# Patient Record
Sex: Female | Born: 1950 | Race: White | Hispanic: No
Health system: Midwestern US, Community
[De-identification: ages and names within clinical notes are randomized; demographics above are authoritative.]

## PROBLEM LIST (undated history)

## (undated) DIAGNOSIS — I10 Essential (primary) hypertension: Secondary | ICD-10-CM

## (undated) DIAGNOSIS — K219 Gastro-esophageal reflux disease without esophagitis: Secondary | ICD-10-CM

## (undated) DIAGNOSIS — T7840XA Allergy, unspecified, initial encounter: Secondary | ICD-10-CM

## (undated) DIAGNOSIS — H269 Unspecified cataract: Secondary | ICD-10-CM

## (undated) DIAGNOSIS — M47816 Spondylosis without myelopathy or radiculopathy, lumbar region: Secondary | ICD-10-CM

## (undated) DIAGNOSIS — M3500 Sicca syndrome, unspecified: Secondary | ICD-10-CM

## (undated) DIAGNOSIS — M1990 Unspecified osteoarthritis, unspecified site: Secondary | ICD-10-CM

## (undated) DIAGNOSIS — E039 Hypothyroidism, unspecified: Secondary | ICD-10-CM

## (undated) HISTORY — DX: Hypothyroidism, unspecified: E03.9

## (undated) HISTORY — DX: Unspecified osteoarthritis, unspecified site: M19.90

## (undated) HISTORY — DX: Sjogren syndrome, unspecified: M35.00

## (undated) HISTORY — PX: CATARACT EXTRACTION, BILATERAL: SHX1313

## (undated) HISTORY — PX: KNEE ARTHROSCOPY W/ MENISCECTOMY: SHX1879

## (undated) HISTORY — DX: Gastro-esophageal reflux disease without esophagitis: K21.9

## (undated) HISTORY — PX: KNEE SURGERY: SHX244

## (undated) HISTORY — DX: Unspecified cataract: H26.9

## (undated) HISTORY — DX: Essential (primary) hypertension: I10

## (undated) HISTORY — PX: EYE SURGERY: SHX253

## (undated) HISTORY — DX: Allergy, unspecified, initial encounter: T78.40XA

## (undated) HISTORY — DX: Spondylosis without myelopathy or radiculopathy, lumbar region: M47.816

---

## 1995-02-28 DIAGNOSIS — E039 Hypothyroidism, unspecified: Secondary | ICD-10-CM | POA: Insufficient documentation

## 2011-08-24 DIAGNOSIS — M25569 Pain in unspecified knee: Secondary | ICD-10-CM | POA: Insufficient documentation

## 2011-08-24 DIAGNOSIS — G8929 Other chronic pain: Secondary | ICD-10-CM | POA: Insufficient documentation

## 2011-12-08 DIAGNOSIS — Z85828 Personal history of other malignant neoplasm of skin: Secondary | ICD-10-CM | POA: Insufficient documentation

## 2013-05-22 ENCOUNTER — Other Ambulatory Visit: Payer: Self-pay | Admitting: Obstetrics and Gynecology

## 2013-05-22 DIAGNOSIS — R928 Other abnormal and inconclusive findings on diagnostic imaging of breast: Secondary | ICD-10-CM

## 2013-06-02 ENCOUNTER — Ambulatory Visit
Admission: RE | Admit: 2013-06-02 | Discharge: 2013-06-02 | Disposition: A | Discharge status: Home / Self Care | Payer: Self-pay | Source: Ambulatory Visit | Attending: Obstetrics and Gynecology | Admitting: Obstetrics and Gynecology

## 2013-06-02 DIAGNOSIS — R928 Other abnormal and inconclusive findings on diagnostic imaging of breast: Secondary | ICD-10-CM

## 2014-06-15 ENCOUNTER — Other Ambulatory Visit: Payer: Self-pay | Admitting: Obstetrics and Gynecology

## 2014-06-15 DIAGNOSIS — R928 Other abnormal and inconclusive findings on diagnostic imaging of breast: Secondary | ICD-10-CM

## 2014-06-17 ENCOUNTER — Ambulatory Visit
Admission: RE | Admit: 2014-06-17 | Discharge: 2014-06-17 | Disposition: A | Discharge status: Home / Self Care | Payer: Federal, State, Local not specified - PPO | Source: Ambulatory Visit | Attending: Obstetrics and Gynecology | Admitting: Obstetrics and Gynecology

## 2014-06-17 DIAGNOSIS — R928 Other abnormal and inconclusive findings on diagnostic imaging of breast: Secondary | ICD-10-CM

## 2014-12-15 ENCOUNTER — Ambulatory Visit (INDEPENDENT_AMBULATORY_CARE_PROVIDER_SITE_OTHER): Payer: Federal, State, Local not specified - PPO | Admitting: Sports Medicine

## 2014-12-15 ENCOUNTER — Encounter: Payer: Self-pay | Admitting: Sports Medicine

## 2014-12-15 VITALS — BP 121/73 | HR 56 | Ht 63.5 in | Wt 126.0 lb

## 2014-12-15 DIAGNOSIS — M7652 Patellar tendinitis, left knee: Secondary | ICD-10-CM | POA: Diagnosis not present

## 2014-12-15 DIAGNOSIS — M47816 Spondylosis without myelopathy or radiculopathy, lumbar region: Secondary | ICD-10-CM

## 2014-12-15 DIAGNOSIS — M722 Plantar fascial fibromatosis: Secondary | ICD-10-CM | POA: Insufficient documentation

## 2014-12-15 MED ORDER — MELOXICAM 15 MG PO TABS: ORAL_TABLET | ORAL | Status: DC

## 2014-12-15 NOTE — Assessment & Plan Note (Signed)
Has failed months of physical therapy, a couple of unguided steroid injections, and custom orthotics created by podiatry. She will return for a new set of custom orthotics and I am going to switch her to meloxicam. Certainly we can try the air heel, as well as a nighttime splint in the future.

## 2014-12-15 NOTE — Assessment & Plan Note (Signed)
Sounds to be facet arthritis, mild discogenic-type symptoms as well. Has only had x-rays, and months of physical therapy. Meloxicam,I would like to get an MRI at this point for interventional planning, she will call back and let me know when she is ready.

## 2014-12-15 NOTE — Assessment & Plan Note (Signed)
Confirmed on MRI 6 years ago, has failed multiple PRP injections, physical therapy, bracing and strapping. We probably we'll need a new MRI, and she is agreeable to try another PRP injection possibly in the future. She will think about it and let me know if she would like a new MRI.

## 2014-12-15 NOTE — Progress Notes (Signed)
   Subjective:    I'm seeing this patient as a consultation for:  Dr. Loyal JacobsonMichael Kalish  CC: multiple issues  HPI: Left foot pain: Clinical diagnosis of plantar fasciitis post months of physical therapy, has had a couple of injections non-with ultrasound guidance, had some orthotics made by podiatrist, rigid plastic, uncomfortable. Unfortunately with persistent pain in the mornings, moderate, persistent, no radiation.  Low back pain: Sharp, axial without radiculopathy, worse with sitting, no bowel or bladder dysfunction or saddle numbness, has only had x-rays and months of physical therapy, Celebrex is ineffective. She does get significant morning stiffness and has been referred to rheumatology.  Left knee pain: Post partial meniscectomy, now with pain inferior to the patella, MRI 6 years ago showed patellar tendinosis, she has been through 2 or 3 sessions of PRP without any improvement.  Past medical history, Surgical history, Family history not pertinant except as noted below, Social history, Allergies, and medications have been entered into the medical record, reviewed, and no changes needed.   Review of Systems: No headache, visual changes, nausea, vomiting, diarrhea, constipation, dizziness, abdominal pain, skin rash, fevers, chills, night sweats, weight loss, swollen lymph nodes, body aches, joint swelling, muscle aches, chest pain, shortness of breath, mood changes, visual or auditory hallucinations.   Objective:   General: Well Developed, well nourished, and in no acute distress.  Neuro/Psych: Alert and oriented x3, extra-ocular muscles intact, able to move all 4 extremities, sensation grossly intact. Skin: Warm and dry, no rashes noted.  Respiratory: Not using accessory muscles, speaking in full sentences, trachea midline.  Cardiovascular: Pulses palpable, no extremity edema. Abdomen: Does not appear distended. Left Foot: No visible erythema or swelling. Range of motion is full in  all directions. Strength is 5/5 in all directions. No hallux valgus. No pes cavus or pes planus. No abnormal callus noted. No pain over the navicular prominence, or base of fifth metatarsal. mild tenderness to palpation of the calcaneal insertion of plantar fascia. No pain at the Achilles insertion. No pain over the calcaneal bursa. No pain of the retrocalcaneal bursa. No tenderness to palpation over the tarsals, metatarsals, or phalanges. No hallux rigidus or limitus. No tenderness palpation over interphalangeal joints. No pain with compression of the metatarsal heads. Neurovascularly intact distally. Left Knee: Normal to inspection with no erythema or effusion or obvious bony abnormalities. Palpation normal with no warmth or joint line tenderness or patellar tenderness or condyle tenderness. ROM normal in flexion and extension and lower leg rotation. Ligaments with solid consistent endpoints including ACL, PCL, LCL, MCL. Negative Mcmurray's and provocative meniscal tests. Non painful patellar compression. Tender palpation at the inferior patellar pole. Hamstring and quadriceps strength is normal.  Impression and Recommendations:   This case required medical decision making of moderate complexity.

## 2014-12-24 ENCOUNTER — Ambulatory Visit (INDEPENDENT_AMBULATORY_CARE_PROVIDER_SITE_OTHER): Payer: Federal, State, Local not specified - PPO | Admitting: Sports Medicine

## 2014-12-24 ENCOUNTER — Encounter: Payer: Self-pay | Admitting: Sports Medicine

## 2014-12-24 VITALS — BP 134/77 | HR 57 | Ht 63.5 in | Wt 127.0 lb

## 2014-12-24 DIAGNOSIS — M722 Plantar fascial fibromatosis: Secondary | ICD-10-CM

## 2014-12-24 NOTE — Progress Notes (Signed)
    Patient was fitted for a : standard, cushioned, semi-rigid orthotic. The orthotic was heated and afterward the patient stood on the orthotic blank positioned on the orthotic stand. The patient was positioned in subtalar neutral position and 10 degrees of ankle dorsiflexion in a weight bearing stance. After completion of molding, a stable base was applied to the orthotic blank. The blank was ground to a stable position for weight bearing. Size: 8 Base: [facility] Additional Posting and Padding:  I did plane down the toes The patient ambulated these, and they were very comfortable.  I spent 40 minutes with this patient, greater than 50% was face-to-face time counseling regarding the below diagnosis.

## 2014-12-24 NOTE — Assessment & Plan Note (Signed)
Custom orthotics as above, return as needed. 

## 2015-01-25 ENCOUNTER — Ambulatory Visit (INDEPENDENT_AMBULATORY_CARE_PROVIDER_SITE_OTHER): Payer: Federal, State, Local not specified - PPO | Admitting: Sports Medicine

## 2015-01-25 ENCOUNTER — Encounter: Payer: Self-pay | Admitting: Sports Medicine

## 2015-01-25 DIAGNOSIS — M47816 Spondylosis without myelopathy or radiculopathy, lumbar region: Secondary | ICD-10-CM | POA: Diagnosis not present

## 2015-01-25 DIAGNOSIS — M7652 Patellar tendinitis, left knee: Secondary | ICD-10-CM | POA: Diagnosis not present

## 2015-01-25 DIAGNOSIS — M722 Plantar fascial fibromatosis: Secondary | ICD-10-CM | POA: Diagnosis not present

## 2015-01-25 MED ORDER — DIAZEPAM 5 MG PO TABS: ORAL_TABLET | ORAL | Status: DC

## 2015-01-25 NOTE — Assessment & Plan Note (Signed)
Has failed multiple PRP injections, physical therapy, bracing, strapping. We are going to obtain a new MRI, she will probably need to proceed to surgical intervention.

## 2015-01-25 NOTE — Progress Notes (Signed)
  Subjective:    CC: follow-up  HPI: Plantar fasciitis: Persistent pain despite custom orthotics, she has had several unguided injections by other providers. Pain is moderate, persistent and localized at the plantar fascial origin.  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 Agreeable to proceed with MR.  Lumbar spondylosis: Mild discogenic symptoms but predominantly facet-type pain, she has already had greater than 6 weeks of physical therapy, oral medications, x-rays. At this point she is agreeable to proceed with MRI for interventional injection planning.  Past medical history, Surgical history, Family history not pertinant except as noted below, Social history, Allergies, and medications have been entered into the medical record, reviewed, and no changes needed.   Review of Systems: No fevers, chills, night sweats, weight loss, chest pain, or shortness of breath.   Objective:    General: Well Developed, well nourished, and in no acute distress.  Neuro: Alert and oriented x3, extra-ocular muscles intact, sensation grossly intact.  HEENT: Normocephalic, atraumatic, pupils equal round reactive to light, neck supple, no masses, no lymphadenopathy, thyroid nonpalpable.  Skin: Warm and dry, no rashes. Cardiac: Regular rate and rhythm, no murmurs rubs or gallops, no lower extremity edema.  Respiratory: Clear to auscultation bilaterally. Not using accessory muscles, speaking in full sentences.  Procedure: Real-time Ultrasound Guided Injection of left plantar fascia origin Device: GE Logiq E  Verbal informed consent obtained.  Time-out conducted.  Noted no overlying erythema, induration, or other signs of local infection.  Skin prepped in a sterile fashion.  Local anesthesia: Topical Ethyl chloride.  With sterile technique and under real time ultrasound guidance:  1 mL kenalog 40, 2 mL lidocaine injected easily. Completed without  difficulty  Pain immediately resolved suggesting accurate placement of the medication.  Advised to call if fevers/chills, erythema, induration, drainage, or persistent bleeding.  Images permanently stored and available for review in the ultrasound unit.  Impression: Technically successful ultrasound guided injection.  Impression and Recommendations:

## 2015-01-25 NOTE — Assessment & Plan Note (Signed)
Ultrasound guided injection as above, we are going to obtain an MRI, she hasn't had much improvement with the custom orthotics.

## 2015-01-25 NOTE — Assessment & Plan Note (Signed)
Mild discogenic symptoms with predominantly facet mediated pain.  has already had months of physical therapy, oral medications, x-rays. Persistent pain, we are going to obtain an MRI for interventional planning.

## 2015-01-30 ENCOUNTER — Ambulatory Visit (HOSPITAL_BASED_OUTPATIENT_CLINIC_OR_DEPARTMENT_OTHER)
Admission: RE | Admit: 2015-01-30 | Discharge: 2015-01-30 | Disposition: A | Discharge status: Home / Self Care | Payer: Federal, State, Local not specified - PPO | Source: Ambulatory Visit | Attending: Sports Medicine | Admitting: Sports Medicine

## 2015-01-30 DIAGNOSIS — M47816 Spondylosis without myelopathy or radiculopathy, lumbar region: Secondary | ICD-10-CM | POA: Diagnosis not present

## 2015-01-30 DIAGNOSIS — M7122 Synovial cyst of popliteal space [Baker], left knee: Secondary | ICD-10-CM | POA: Insufficient documentation

## 2015-01-30 DIAGNOSIS — M7652 Patellar tendinitis, left knee: Secondary | ICD-10-CM | POA: Insufficient documentation

## 2015-01-30 DIAGNOSIS — M1288 Other specific arthropathies, not elsewhere classified, other specified site: Secondary | ICD-10-CM | POA: Diagnosis not present

## 2015-01-30 DIAGNOSIS — M25562 Pain in left knee: Secondary | ICD-10-CM | POA: Insufficient documentation

## 2015-01-30 DIAGNOSIS — M5126 Other intervertebral disc displacement, lumbar region: Secondary | ICD-10-CM | POA: Insufficient documentation

## 2015-01-30 DIAGNOSIS — M722 Plantar fascial fibromatosis: Secondary | ICD-10-CM | POA: Insufficient documentation

## 2015-01-30 DIAGNOSIS — M659 Synovitis and tenosynovitis, unspecified: Secondary | ICD-10-CM | POA: Insufficient documentation

## 2015-01-30 DIAGNOSIS — M7050 Other bursitis of knee, unspecified knee: Secondary | ICD-10-CM | POA: Diagnosis not present

## 2015-02-04 DIAGNOSIS — J301 Allergic rhinitis due to pollen: Secondary | ICD-10-CM | POA: Insufficient documentation

## 2015-02-04 DIAGNOSIS — H903 Sensorineural hearing loss, bilateral: Secondary | ICD-10-CM | POA: Insufficient documentation

## 2015-02-04 DIAGNOSIS — R040 Epistaxis: Secondary | ICD-10-CM | POA: Insufficient documentation

## 2015-02-04 DIAGNOSIS — H9319 Tinnitus, unspecified ear: Secondary | ICD-10-CM | POA: Insufficient documentation

## 2015-02-04 DIAGNOSIS — Z9109 Other allergy status, other than to drugs and biological substances: Secondary | ICD-10-CM | POA: Insufficient documentation

## 2015-02-09 ENCOUNTER — Other Ambulatory Visit: Payer: Self-pay | Admitting: Sports Medicine

## 2015-02-09 ENCOUNTER — Ambulatory Visit (INDEPENDENT_AMBULATORY_CARE_PROVIDER_SITE_OTHER): Payer: Federal, State, Local not specified - PPO | Admitting: Sports Medicine

## 2015-02-09 VITALS — BP 142/75 | HR 59 | Temp 98.3°F | Resp 16 | Wt 126.0 lb

## 2015-02-09 DIAGNOSIS — M722 Plantar fascial fibromatosis: Secondary | ICD-10-CM

## 2015-02-09 DIAGNOSIS — M47816 Spondylosis without myelopathy or radiculopathy, lumbar region: Secondary | ICD-10-CM

## 2015-02-09 NOTE — Progress Notes (Signed)

## 2015-02-09 NOTE — Assessment & Plan Note (Addendum)
MRI of the lumbar spine did show an L4-L5 protruding disc, symptoms were mildly discogenic but predominantly facetogenic, she doesn't fact have bilateral L4-L5 facet arthritis as well as right L5-S1 facet arthritis, the right L4-L5 and right L5-S1 facets will serve as future interventional targets if needed.

## 2015-02-09 NOTE — Assessment & Plan Note (Signed)
Doing well after injection, custom orthotics as above.

## 2015-02-11 ENCOUNTER — Telehealth: Payer: Self-pay

## 2015-02-11 NOTE — Telephone Encounter (Signed)
Pt states she would like to know more about the procedure you recommended for her hangnail. States she would like to have it done but would like to know what will happen and what type of recovery she needs. Please advise

## 2015-02-12 NOTE — Telephone Encounter (Signed)
Pt advised of process and procedures. Verbalized understanding, will call to schedule when ready.

## 2015-02-12 NOTE — Telephone Encounter (Signed)
The area will be cleaned and draped, numbing medication will be injected so that the procedure is not felt, I will then use a nail elevator to remove an separate nail plate from the nailbed, a clipper will be used to clip off the half of the nail and it will be removed, I will then use a chemical called phenol to treat the nail matrix so that another toenail will not grow on this part. When the numbing medicine wears off there will be some pain so there will be a prescription for narcotics. Things will be almost back to normal in a week or 2.

## 2015-03-13 NOTE — Nursing Note (Signed)
Nursing Discharge Summary - Text       Nursing Discharge Summary Entered On:  03/13/2015 17:34 EST    Performed On:  03/13/2015 17:33 EST by Marrion CoyALICEA, RN, LARA J               DC Information   Discharge To, Anticipated :   Home with family support   Mode of Discharge :   Ambulatory   Transportation :   Private vehicle   Accompanied By :   Theodis AguasSpouse   ALICEA, RN, LARA J - 03/13/2015 17:33 EST   Education   Responsible Learner(s) :   No Data Available     Teaching Method :   Explanation, Printed materials   [place] RN, Kathe BectonLARA J - 03/13/2015 17:33 EST   Post-Hospital Education Adult Grid   Diagnostic Results :   Verbalizes understanding   Importance of Follow-Up Visits :   Verbalizes understanding   Plan of Care :   Verbalizes understanding   When to Call Health Care Provider :   [facility]   [place]ICE[place], RN, Kathe BectonLARA J - 03/13/2015 17:33 EST   Time Spent Educating Patient :   5 minutes   ALICEA, RN, LARA J - 03/13/2015 17:33 EST

## 2015-04-01 DIAGNOSIS — G4700 Insomnia, unspecified: Secondary | ICD-10-CM | POA: Insufficient documentation

## 2015-04-01 DIAGNOSIS — K219 Gastro-esophageal reflux disease without esophagitis: Secondary | ICD-10-CM | POA: Insufficient documentation

## 2015-04-01 DIAGNOSIS — K589 Irritable bowel syndrome without diarrhea: Secondary | ICD-10-CM | POA: Insufficient documentation

## 2015-04-05 ENCOUNTER — Ambulatory Visit (INDEPENDENT_AMBULATORY_CARE_PROVIDER_SITE_OTHER): Payer: Federal, State, Local not specified - PPO | Admitting: Sports Medicine

## 2015-04-05 VITALS — BP 124/68 | HR 60 | Resp 16 | Wt 121.0 lb

## 2015-04-05 DIAGNOSIS — L600 Ingrowing nail: Secondary | ICD-10-CM | POA: Diagnosis not present

## 2015-04-05 MED ORDER — HYDROCODONE-ACETAMINOPHEN 5-325 MG PO TABS: 0.5000 | ORAL_TABLET | Freq: Three times a day (TID) | ORAL | Status: DC | PRN

## 2015-04-05 NOTE — Progress Notes (Signed)
  Subjective:    CC: ingrown toenail  HPI: This is a pleasant 65 year old female, for sometime now she's had pain on her right second toenail, it has been ingrown. She did see dermatologist who did some sort of test for fungus which was negative. Most likely a KOH prep.she is here for consideration of surgical intervention. Pain is moderate, persistent, no radiation.  Past medical history, Surgical history, Family history not pertinant except as noted below, Social history, Allergies, and medications have been entered into the medical record, reviewed, and no changes needed.   Review of Systems: No fevers, chills, night sweats, weight loss, chest pain, or shortness of breath.   Objective:    General: Well Developed, well nourished, and in no acute distress.  Neuro: Alert and oriented x3, extra-ocular muscles intact, sensation grossly intact.  HEENT: Normocephalic, atraumatic, pupils equal round reactive to light, neck supple, no masses, no lymphadenopathy, thyroid nonpalpable.  Skin: Warm and dry, no rashes. Cardiac: Regular rate and rhythm, no murmurs rubs or gallops, no lower extremity edema.  Respiratory: Clear to auscultation bilaterally. Not using accessory muscles, speaking in full sentences. Right great toe: Medial and lateral nail plate is ingrown. No signs of paronychia.  Procedure:  Removal of medial and lateral right second nail plate Risks, benefits, alternatives explained to patient. Consent obtained. Time out conducted. Noted no overlying induration or erythema at site of injection. Toe cleaned with alcohol, then a total of 10cc lidocaine 1% infiltrated at adjacent webspaces at the location of the bifurcation of the common digital nerve to proper digital nerves.  Some lidocaine also infiltrated at hyponychium and under nail bed.  Adequate anesthesia ensured. Toe prepped and draped in a sterile fashion. Nail elevator used to separate nail plate from nail bed. Clippers used  to cut toenail in a longitudinal fashion to proximal nail fold and matrix. Hemostat then used to separate nail fragment from surrounding structures. Nail bed and matrix treated. Minor bleeding controlled with pressure and phenol. Antibiotic ointment applied. Toe dressed. Advised to return if increased redness, swelling, drainage, fevers, or chills.  Impression and Recommendations:

## 2015-04-05 NOTE — Assessment & Plan Note (Signed)
No evidence of onychomycosis per dermatology visit. Medial and lateral nail plate excision as above with phenol treatment, return to see me in 2 weeks for wound check. Hydrocodone for postprocedural pain.

## 2015-04-12 ENCOUNTER — Other Ambulatory Visit: Payer: Self-pay | Admitting: Sports Medicine

## 2015-04-15 ENCOUNTER — Ambulatory Visit (INDEPENDENT_AMBULATORY_CARE_PROVIDER_SITE_OTHER): Payer: Federal, State, Local not specified - PPO | Admitting: Sports Medicine

## 2015-04-15 ENCOUNTER — Encounter: Payer: Self-pay | Admitting: Sports Medicine

## 2015-04-15 DIAGNOSIS — L600 Ingrowing nail: Secondary | ICD-10-CM

## 2015-04-15 MED ORDER — IBUPROFEN 800 MG PO TABS: 800.0000 mg | ORAL_TABLET | Freq: Three times a day (TID) | ORAL | Status: DC | PRN

## 2015-04-15 NOTE — Progress Notes (Signed)
  Subjective: Almost 2 weeks post second medial and lateral nail plate excision on the right foot. Unwell.   Objective: General: Well-developed, well-nourished, and in no acute distress. Right foot: No signs of bacterial superinfection, minimal erythema, minimal tenderness, healing well.  Assessment/plan:

## 2015-04-15 NOTE — Assessment & Plan Note (Signed)
Doing extremely well after medial and lateral nail plate excision with phenol treatment. Adding ibuprofen 800, return to see me on an as-needed basis.

## 2015-04-20 ENCOUNTER — Ambulatory Visit: Payer: Federal, State, Local not specified - PPO | Admitting: Sports Medicine

## 2015-05-03 DIAGNOSIS — K117 Disturbances of salivary secretion: Secondary | ICD-10-CM | POA: Insufficient documentation

## 2015-05-03 DIAGNOSIS — K146 Glossodynia: Secondary | ICD-10-CM | POA: Insufficient documentation

## 2015-05-10 ENCOUNTER — Ambulatory Visit (INDEPENDENT_AMBULATORY_CARE_PROVIDER_SITE_OTHER): Payer: Federal, State, Local not specified - PPO | Admitting: Sports Medicine

## 2015-05-10 VITALS — BP 148/74 | HR 59 | Resp 18

## 2015-05-10 DIAGNOSIS — M47816 Spondylosis without myelopathy or radiculopathy, lumbar region: Secondary | ICD-10-CM | POA: Diagnosis not present

## 2015-05-10 MED ORDER — METHYLPREDNISOLONE SODIUM SUCC 125 MG IJ SOLR
125.0000 mg | Freq: Once | INTRAMUSCULAR | Status: AC
  Administered 2015-05-10: 125 mg via INTRAMUSCULAR

## 2015-05-10 MED ORDER — KETOROLAC TROMETHAMINE 30 MG/ML IJ SOLN
30.0000 mg | Freq: Once | INTRAMUSCULAR | Status: AC
  Administered 2015-05-10: 30 mg via INTRAMUSCULAR

## 2015-05-10 NOTE — Progress Notes (Signed)
  Subjective:    CC: Back pain  HPI: This is a pleasant 65 year old female, I treated her before for lumbar radiculopathy, pain at the time was predominantly facet mediated, more recently she over did it with physical therapy and exercise and has pain that she localizes in the right side of her low back with radiation down the right leg in an L4 distribution. Worse with flexion, extension does not create pain this time. She was seen in urgent care and given prednisone and Vicodin, she returns today here bit better. No bowel or bladder dysfunction, saddle numbness, constitutional symptoms.  Past medical history, Surgical history, Family history not pertinant except as noted below, Social history, Allergies, and medications have been entered into the medical record, reviewed, and no changes needed.   Review of Systems: No fevers, chills, night sweats, weight loss, chest pain, or shortness of breath.   Objective:    General: Well Developed, well nourished, and in no acute distress.  Neuro: Alert and oriented x3, extra-ocular muscles intact, sensation grossly intact.  HEENT: Normocephalic, atraumatic, pupils equal round reactive to light, neck supple, no masses, no lymphadenopathy, thyroid nonpalpable.  Skin: Warm and dry, no rashes. Cardiac: Regular rate and rhythm, no murmurs rubs or gallops, no lower extremity edema.  Respiratory: Clear to auscultation bilaterally. Not using accessory muscles, speaking in full sentences. Back Exam:  Inspection: Unremarkable  Motion: Flexion 45 deg, Extension 45 deg, Side Bending to 45 deg bilaterally,  Rotation to 45 deg bilaterally  SLR laying: Positive with reproduction of L4 radiculopathy  XSLR laying: Negative  Palpable tenderness: None. FABER: negative. Sensory change: Gross sensation intact to all lumbar and sacral dermatomes.  Reflexes: 2+ at both patellar tendons, 2+ at achilles tendons, Babinski's downgoing.  Strength at foot  Plantar-flexion:  5/5 Dorsi-flexion: 5/5 Eversion: 5/5 Inversion: 5/5  Leg strength  Quad: 5/5 Hamstring: 5/5 Hip flexor: 5/5 Hip abductors: 5/5  Gait unremarkable.  Impression and Recommendations:

## 2015-05-10 NOTE — Assessment & Plan Note (Signed)
Symptoms today are mostly right L4 radiculopathy. She does have an L4-L5 fusion. Toradol 30, Solu-Medrol 125. If no better in one week we will proceed with a right L4-L5 interlaminar epidural

## 2015-05-11 ENCOUNTER — Other Ambulatory Visit: Payer: Self-pay | Admitting: Sports Medicine

## 2015-05-12 ENCOUNTER — Telehealth: Payer: Self-pay

## 2015-05-12 DIAGNOSIS — L600 Ingrowing nail: Secondary | ICD-10-CM

## 2015-05-12 DIAGNOSIS — M47816 Spondylosis without myelopathy or radiculopathy, lumbar region: Secondary | ICD-10-CM

## 2015-05-12 NOTE — Telephone Encounter (Signed)
Pt left VM stating she is not having any relief and would like to have epidural done at this time. Please assist. Thank you.

## 2015-05-13 MED ORDER — HYDROCODONE-ACETAMINOPHEN 5-325 MG PO TABS: 0.5000 | ORAL_TABLET | Freq: Three times a day (TID) | ORAL | Status: DC | PRN

## 2015-05-13 NOTE — Telephone Encounter (Signed)
Ordering a right L4-L5 interlaminar epidural, please contact [facility].

## 2015-05-13 NOTE — Telephone Encounter (Signed)
Informed pt order for epidural has been placed and [facility]. Pt would also like a refill of her pain medication until she can get in to get her epidural. Verbal order given by Dr. Karie Schwalbe. Pt informed this medication has to be picked up in office.

## 2015-05-17 ENCOUNTER — Ambulatory Visit
Admission: RE | Admit: 2015-05-17 | Discharge: 2015-05-17 | Disposition: A | Discharge status: Home / Self Care | Payer: Federal, State, Local not specified - PPO | Source: Ambulatory Visit | Attending: Sports Medicine | Admitting: Sports Medicine

## 2015-05-17 MED ORDER — METHYLPREDNISOLONE ACETATE 40 MG/ML INJ SUSP (RADIOLOG
120.0000 mg | Freq: Once | INTRAMUSCULAR | Status: AC
  Administered 2015-05-17: 120 mg via EPIDURAL

## 2015-05-17 MED ORDER — IOHEXOL 180 MG/ML  SOLN
1.0000 mL | Freq: Once | INTRAMUSCULAR | Status: AC | PRN
  Administered 2015-05-17: 1 mL via EPIDURAL

## 2015-05-17 NOTE — Discharge Instructions (Signed)

## 2015-05-21 ENCOUNTER — Ambulatory Visit (INDEPENDENT_AMBULATORY_CARE_PROVIDER_SITE_OTHER): Payer: Federal, State, Local not specified - PPO | Admitting: Sports Medicine

## 2015-05-21 ENCOUNTER — Ambulatory Visit: Payer: Federal, State, Local not specified - PPO | Admitting: Sports Medicine

## 2015-05-21 ENCOUNTER — Encounter: Payer: Self-pay | Admitting: Sports Medicine

## 2015-05-21 DIAGNOSIS — M47816 Spondylosis without myelopathy or radiculopathy, lumbar region: Secondary | ICD-10-CM | POA: Diagnosis not present

## 2015-05-21 MED ORDER — OXYCODONE HCL 5 MG PO TABS
5.0000 mg | ORAL_TABLET | Freq: Every day | ORAL | Status: DC | PRN
Start: 2015-05-21 — End: 2015-06-10

## 2015-05-21 MED ORDER — CYCLOBENZAPRINE HCL 10 MG PO TABS: ORAL_TABLET | ORAL | Status: DC

## 2015-05-21 NOTE — Assessment & Plan Note (Signed)
Fantastic response to lumbar epidural, adding a bit of Flexeril and short course of oxycodone per patient request. Return to see me in one month.

## 2015-05-21 NOTE — Progress Notes (Signed)
  Subjective:    CC: Follow-up  HPI: Lumbar spondylosis: Fantastic response to lumbar epidural, has a bit of pain at night and difficulty sleeping and requests some oxycodone. Understands that this will be short-term use only.  Past medical history, Surgical history, Family history not pertinant except as noted below, Social history, Allergies, and medications have been entered into the medical record, reviewed, and no changes needed.   Review of Systems: No fevers, chills, night sweats, weight loss, chest pain, or shortness of breath.   Objective:    General: Well Developed, well nourished, and in no acute distress.  Neuro: Alert and oriented x3, extra-ocular muscles intact, sensation grossly intact.  HEENT: Normocephalic, atraumatic, pupils equal round reactive to light, neck supple, no masses, no lymphadenopathy, thyroid nonpalpable.  Skin: Warm and dry, no rashes. Cardiac: Regular rate and rhythm, no murmurs rubs or gallops, no lower extremity edema.  Respiratory: Clear to auscultation bilaterally. Not using accessory muscles, speaking in full sentences.  Impression and Recommendations:    I spent 25 minutes with this patient, greater than 50% was face-to-face time counseling regarding the above diagnoses

## 2015-05-26 ENCOUNTER — Other Ambulatory Visit: Payer: Self-pay

## 2015-05-26 DIAGNOSIS — M47816 Spondylosis without myelopathy or radiculopathy, lumbar region: Secondary | ICD-10-CM

## 2015-05-26 NOTE — Telephone Encounter (Signed)
Pt left VM stating she's going out of town this weekend and would like a refill of oxycodone in case of a flare up.Stated she has 1.5 pills at this time. Please advise.

## 2015-05-27 NOTE — Telephone Encounter (Signed)
30 pills given 6 days ago and we agreed that this would be short-term use only. I'm happy to use tramadol instead but no more oxycodone.

## 2015-05-28 MED ORDER — TRAMADOL HCL 50 MG PO TABS: 50.0000 mg | ORAL_TABLET | Freq: Three times a day (TID) | ORAL | Status: DC | PRN

## 2015-05-28 NOTE — Telephone Encounter (Signed)
Rx faxed

## 2015-05-28 NOTE — Telephone Encounter (Signed)
[place], Rx placed in in-box ready for pickup/faxing.  

## 2015-05-28 NOTE — Telephone Encounter (Signed)
Patient agreed to try the tramadol.

## 2015-06-10 ENCOUNTER — Encounter: Payer: Self-pay | Admitting: Family Medicine

## 2015-06-10 ENCOUNTER — Ambulatory Visit (INDEPENDENT_AMBULATORY_CARE_PROVIDER_SITE_OTHER): Payer: Federal, State, Local not specified - PPO | Admitting: Family Medicine

## 2015-06-10 VITALS — BP 130/74 | HR 73 | Wt 124.0 lb

## 2015-06-10 DIAGNOSIS — M47816 Spondylosis without myelopathy or radiculopathy, lumbar region: Secondary | ICD-10-CM | POA: Diagnosis not present

## 2015-06-10 MED ORDER — OXYCODONE HCL 5 MG PO TABS: 5.0000 mg | ORAL_TABLET | Freq: Every day | ORAL | Status: DC | PRN

## 2015-06-10 NOTE — Patient Instructions (Signed)
Thank you for coming in today. Follow up with Dr T.  Continue PT.  Get new epidural steroid injection.  Use oxycodone sparingly.   Come back or go to the emergency room if you notice new weakness new numbness problems walking or bowel or bladder problems.

## 2015-06-10 NOTE — Assessment & Plan Note (Signed)
Sparing use of oxycodone. Refill this medication. Additionally reordered epidural steroid injection. Follow-up with Dr. Karie Schwalbe on April 24.

## 2015-06-10 NOTE — Progress Notes (Signed)
Kathleen [name] is a 65 y.o. female [facility] for back pain radiating to the right leg. Patient has previously been seen by Dr. Karie Schwalbe for this problem. She had an epidural steroid injection on March 20th. She notes this helped however her symptoms have returned. She notes pain in her back and radiating to the right lower leg. She denies any weakness or numbness or loss of function. She does note some tingling sensation to the lateral leg. She notes she has been using oxycodone at night which is quite helpful. She recently switched to tramadol which was not helpful at all. She has difficulty sleeping because the pain has returned. She has an appointment with Dr. Karie Schwalbe on April 24.   No past medical history on file. No past surgical history on file. Social History  Substance Use Topics  . Smoking status: Never Smoker   . Smokeless tobacco: Not on file  . Alcohol Use: Not on file   family history is not on file.  ROS as above Medications: Current Outpatient Prescriptions  Medication Sig Dispense Refill  . Acetaminophen (TYLENOL) 325 MG CAPS Take by mouth.    . Ascorbic Acid (VITAMIN C) 100 MG tablet Take 100 mg by mouth daily.    . cyclobenzaprine (FLEXERIL) 10 MG tablet One half tab PO qHS, then increase gradually to one tab TID. 30 tablet 0  . diazepam (VALIUM) 5 MG tablet Take 1 tab PO 1 hour before procedure or imaging. 2 tablet 0  . fexofenadine (ALLEGRA) 180 MG tablet Take 180 mg by mouth daily.    Marland Kitchen. FLUTICASONE PROPIONATE, INHAL, IN Inhale into the lungs.    . Glucosamine HCl (GLUCOSAMINE PO) Take by mouth.    Marland Kitchen. ibuprofen (ADVIL,MOTRIN) 800 MG tablet Take 1 tablet (800 mg total) by mouth every 8 (eight) hours as needed. 60 tablet 2  . Lactobacillus Rhamnosus, GG, (CULTURELLE) CAPS Take by mouth.    . levothyroxine (LEVOXYL) 75 MCG tablet Take 75 mcg by mouth daily  before breakfast.    . MELATONIN PO Take by mouth.    . meloxicam (MOBIC) 15 MG tablet TAKE 1 TABLET BY MOUTH EVERY MORNING WITH BREAKFAST FOR 2 WEEKS, THAN TAKE DAILY AS NEEDED FOR PAIN 90 tablet 0  . montelukast (SINGULAIR) 10 MG tablet Take 10 mg by mouth at bedtime.    . Omega-3 Fatty Acids (FISH OIL) 1000 MG CAPS Take by mouth.    Marland Kitchen. omeprazole (PRILOSEC) 40 MG capsule Take 40 mg by mouth daily.    Marland Kitchen. oxyCODONE (OXY IR/ROXICODONE) 5 MG immediate release tablet Take 1 tablet (5 mg total) by mouth daily as needed for severe pain. 20 tablet 0  . Vitamin D, Cholecalciferol, 400 units CAPS Take by mouth.    . zolpidem (AMBIEN) 10 MG tablet Take 10 mg by mouth at bedtime as needed for sleep.     No current facility-administered medications for this visit.   Allergies  Allergen Reactions  . Oxycodone Nausea And Vomiting  . Clindamycin/Lincomycin      Exam:  BP 130/74 mmHg  Pulse 73  Wt 124 lb (56.246 kg) Gen: Well NAD Back: Nontender to spinal midline. Normal lumbar motion but pain with extension. Lower extremity strength is equal and normal throughout. Sensation is intact and normal bilaterally. Normal gait.  MRI lumbar spine dated 01/30/2015 reviewed  No results found for this or any previous visit (from the past 24 hour(s)).  No results found.   Please see individual assessment and plan sections.

## 2015-06-21 ENCOUNTER — Encounter: Payer: Self-pay | Admitting: Sports Medicine

## 2015-06-21 ENCOUNTER — Other Ambulatory Visit: Payer: Self-pay | Admitting: Sports Medicine

## 2015-06-21 ENCOUNTER — Ambulatory Visit (INDEPENDENT_AMBULATORY_CARE_PROVIDER_SITE_OTHER): Payer: Federal, State, Local not specified - PPO | Admitting: Sports Medicine

## 2015-06-21 DIAGNOSIS — M47816 Spondylosis without myelopathy or radiculopathy, lumbar region: Secondary | ICD-10-CM

## 2015-06-21 MED ORDER — GABAPENTIN 300 MG PO CAPS: ORAL_CAPSULE | ORAL | Status: DC

## 2015-06-21 NOTE — Assessment & Plan Note (Signed)
Fantastic response to epidurals, she has her second epidural scheduled for tomorrow, adding gabapentin at bedtime, discontinue muscle relaxers.

## 2015-06-21 NOTE — Progress Notes (Signed)
  Subjective:    CC: Follow-up  HPI: Lumbar spondylosis: Fantastic response to lumbar epidural, having a recurrence of pain, secondary epidural is scheduled for tomorrow. Does tend to wake up with some cramping, agreeable to try gabapentin.  Past medical history, Surgical history, Family history not pertinant except as noted below, Social history, Allergies, and medications have been entered into the medical record, reviewed, and no changes needed.   Review of Systems: No fevers, chills, night sweats, weight loss, chest pain, or shortness of breath.   Objective:    General: Well Developed, well nourished, and in no acute distress.  Neuro: Alert and oriented x3, extra-ocular muscles intact, sensation grossly intact.  HEENT: Normocephalic, atraumatic, pupils equal round reactive to light, neck supple, no masses, no lymphadenopathy, thyroid nonpalpable.  Skin: Warm and dry, no rashes. Cardiac: Regular rate and rhythm, no murmurs rubs or gallops, no lower extremity edema.  Respiratory: Clear to auscultation bilaterally. Not using accessory muscles, speaking in full sentences.  Impression and Recommendations:   I spent 25 minutes with this patient, greater than 50% was face-to-face time counseling regarding the above diagnoses

## 2015-06-22 ENCOUNTER — Ambulatory Visit
Admission: RE | Admit: 2015-06-22 | Discharge: 2015-06-22 | Disposition: A | Discharge status: Home / Self Care | Payer: Federal, State, Local not specified - PPO | Source: Ambulatory Visit | Attending: Family Medicine | Admitting: Family Medicine

## 2015-06-22 VITALS — BP 157/78 | HR 67

## 2015-06-22 DIAGNOSIS — L600 Ingrowing nail: Secondary | ICD-10-CM

## 2015-06-22 DIAGNOSIS — M722 Plantar fascial fibromatosis: Secondary | ICD-10-CM

## 2015-06-22 DIAGNOSIS — M7652 Patellar tendinitis, left knee: Secondary | ICD-10-CM

## 2015-06-22 MED ORDER — METHYLPREDNISOLONE ACETATE 40 MG/ML INJ SUSP (RADIOLOG
120.0000 mg | Freq: Once | INTRAMUSCULAR | Status: AC
  Administered 2015-06-22: 120 mg via EPIDURAL

## 2015-06-22 MED ORDER — IOPAMIDOL (ISOVUE-M 200) INJECTION 41%
1.0000 mL | Freq: Once | INTRAMUSCULAR | Status: AC
  Administered 2015-06-22: 1 mL via EPIDURAL

## 2015-06-22 NOTE — Discharge Instructions (Signed)

## 2015-07-30 ENCOUNTER — Other Ambulatory Visit: Payer: Self-pay | Admitting: Sports Medicine

## 2015-08-12 ENCOUNTER — Encounter: Payer: Self-pay | Admitting: Sports Medicine

## 2015-08-12 ENCOUNTER — Ambulatory Visit (INDEPENDENT_AMBULATORY_CARE_PROVIDER_SITE_OTHER): Payer: Federal, State, Local not specified - PPO | Admitting: Sports Medicine

## 2015-08-12 DIAGNOSIS — M47816 Spondylosis without myelopathy or radiculopathy, lumbar region: Secondary | ICD-10-CM

## 2015-08-12 NOTE — Progress Notes (Signed)
  Subjective:    CC: Follow-up  HPI: Lumbar spondylosis: With back pain is multifactorial, discogenic and facetogenic components, she's done well initially with aggressive physical therapy, epidural injections, meloxicam, has not been compliant with her gabapentin. On further questioning her radicular pain improved significantly with the epidurals, her axial pain is persistent and relieved with flexion. No fevers, chills, constitutional symptoms, bowel or bladder dysfunction or saddle numbness.  Past medical history, Surgical history, Family history not pertinant except as noted below, Social history, Allergies, and medications have been entered into the medical record, reviewed, and no changes needed.   Review of Systems: No fevers, chills, night sweats, weight loss, chest pain, or shortness of breath.   Objective:    General: Well Developed, well nourished, and in no acute distress.  Neuro: Alert and oriented x3, extra-ocular muscles intact, sensation grossly intact.  HEENT: Normocephalic, atraumatic, pupils equal round reactive to light, neck supple, no masses, no lymphadenopathy, thyroid nonpalpable.  Skin: Warm and dry, no rashes. Cardiac: Regular rate and rhythm, no murmurs rubs or gallops, no lower extremity edema.  Respiratory: Clear to auscultation bilaterally. Not using accessory muscles, speaking in full sentences.  MRI personally reviewed, there is multilevel disc fusions, there is also L4-L5 facet arthritis.  Impression and Recommendations:   I spent 25 minutes with this patient, greater than 50% was face-to-face time counseling regarding the above diagnoses

## 2015-08-12 NOTE — Assessment & Plan Note (Signed)
Moderate response to lumbar epidurals, she does have L4-L5 facet arthritis. We are going to use this as our next interventional target, she will restart her gabapentin and proceed with bilateral L4-L5 facet injections.

## 2015-09-06 ENCOUNTER — Ambulatory Visit
Admission: RE | Admit: 2015-09-06 | Discharge: 2015-09-06 | Disposition: A | Discharge status: Home / Self Care | Payer: Federal, State, Local not specified - PPO | Source: Ambulatory Visit | Attending: Sports Medicine | Admitting: Sports Medicine

## 2015-09-06 MED ORDER — IOPAMIDOL (ISOVUE-M 200) INJECTION 41%
1.0000 mL | Freq: Once | INTRAMUSCULAR | Status: AC
  Administered 2015-09-06: 1 mL via INTRA_ARTICULAR

## 2015-09-06 MED ORDER — METHYLPREDNISOLONE ACETATE 40 MG/ML INJ SUSP (RADIOLOG
120.0000 mg | Freq: Once | INTRAMUSCULAR | Status: AC
  Administered 2015-09-06: 120 mg via INTRA_ARTICULAR

## 2015-09-06 NOTE — Discharge Instructions (Signed)

## 2015-09-27 ENCOUNTER — Encounter: Payer: Self-pay | Admitting: Sports Medicine

## 2015-09-27 ENCOUNTER — Ambulatory Visit (INDEPENDENT_AMBULATORY_CARE_PROVIDER_SITE_OTHER): Payer: Federal, State, Local not specified - PPO | Admitting: Sports Medicine

## 2015-09-27 ENCOUNTER — Other Ambulatory Visit: Payer: Self-pay | Admitting: Sports Medicine

## 2015-09-27 DIAGNOSIS — M7652 Patellar tendinitis, left knee: Secondary | ICD-10-CM | POA: Diagnosis not present

## 2015-09-27 DIAGNOSIS — M47816 Spondylosis without myelopathy or radiculopathy, lumbar region: Secondary | ICD-10-CM

## 2015-09-27 MED ORDER — GABAPENTIN 300 MG PO CAPS: 600.0000 mg | ORAL_CAPSULE | Freq: Every day | ORAL | 11 refills | Status: DC

## 2015-09-27 MED ORDER — GABAPENTIN 100 MG PO CAPS: ORAL_CAPSULE | ORAL | 11 refills | Status: DC

## 2015-09-27 NOTE — Progress Notes (Signed)
  Subjective:    CC: Follow-up  HPI: Back pain: Good response to axial pain with bilateral L4-L5 facet injection, radicular pain and discogenic pain is mostly gone, she does have a bit of residual pain at the right SI joint. Overall doing well. Doing well with gabapentin at night, still not sleeping, however taking at 300 mg gabapentin during the day does leave her somewhat groggy.  Patellar tendinitis: Has had several PRP injections, they are going to drop off a disc with a video of the technique, if I think there is any improvement to be made they're willing to do an additional PRP injection.  Past medical history, Surgical history, Family history not pertinant except as noted below, Social history, Allergies, and medications have been entered into the medical record, reviewed, and no changes needed.   Review of Systems: No fevers, chills, night sweats, weight loss, chest pain, or shortness of breath.   Objective:    General: Well Developed, well nourished, and in no acute distress.  Neuro: Alert and oriented x3, extra-ocular muscles intact, sensation grossly intact.  HEENT: Normocephalic, atraumatic, pupils equal round reactive to light, neck supple, no masses, no lymphadenopathy, thyroid nonpalpable.  Skin: Warm and dry, no rashes. Cardiac: Regular rate and rhythm, no murmurs rubs or gallops, no lower extremity edema.  Respiratory: Clear to auscultation bilaterally. Not using accessory muscles, speaking in full sentences.  Impression and Recommendations:    Patellar tendinosis of left knee Persistent pain, MRI confirmed diagnosis, they are going to drop off a flash drive of the technique of her prior PRP injection, if technique is adequate we will refer for surgery, if technique is inadequate we will try an additional PRP.  Lumbar spondylosis Good response to axial discogenic and radicular pain with her epidural, then subsequent good relief of axial facetogenic pain with her bilateral  L4-L5 facet injections. Still has a bit of pain at the right sacroiliac joint which is not bad enough to consider repeat interventional treatment. She will increase to 600 mg of gabapentin at bedtime and 100 mg during the day.

## 2015-09-27 NOTE — Assessment & Plan Note (Signed)
Good response to axial discogenic and radicular pain with her epidural, then subsequent good relief of axial facetogenic pain with her bilateral L4-L5 facet injections. Still has a bit of pain at the right sacroiliac joint which is not bad enough to consider repeat interventional treatment. She will increase to 600 mg of gabapentin at bedtime and 100 mg during the day.

## 2015-09-27 NOTE — Assessment & Plan Note (Signed)
Persistent pain, MRI confirmed diagnosis, they are going to drop off a flash drive of the technique of her prior PRP injection, if technique is adequate we will refer for surgery, if technique is inadequate we will try an additional PRP.

## 2015-10-07 ENCOUNTER — Telehealth: Payer: Self-pay | Admitting: Sports Medicine

## 2015-10-07 DIAGNOSIS — M7652 Patellar tendinitis, left knee: Secondary | ICD-10-CM

## 2015-10-07 NOTE — Telephone Encounter (Signed)
I watched videos of her [facility], the technique was acceptable, I did offer a single additional trauma, this time from an undersurface approach. I did ask her to come and pick up her thumb dry.

## 2015-11-02 ENCOUNTER — Other Ambulatory Visit: Payer: Self-pay | Admitting: Sports Medicine

## 2015-11-09 ENCOUNTER — Ambulatory Visit (INDEPENDENT_AMBULATORY_CARE_PROVIDER_SITE_OTHER): Payer: Federal, State, Local not specified - PPO | Admitting: Sports Medicine

## 2015-11-09 ENCOUNTER — Encounter: Payer: Self-pay | Admitting: Sports Medicine

## 2015-11-09 DIAGNOSIS — M7652 Patellar tendinitis, left knee: Secondary | ICD-10-CM | POA: Diagnosis not present

## 2015-11-09 DIAGNOSIS — M47816 Spondylosis without myelopathy or radiculopathy, lumbar region: Secondary | ICD-10-CM

## 2015-11-09 DIAGNOSIS — M722 Plantar fascial fibromatosis: Secondary | ICD-10-CM

## 2015-11-09 MED ORDER — TRAMADOL-ACETAMINOPHEN 37.5-325 MG PO TABS: 1.0000 | ORAL_TABLET | Freq: Three times a day (TID) | ORAL | 0 refills | Status: DC | PRN

## 2015-11-09 NOTE — Assessment & Plan Note (Signed)
Intermittent pain, with MRI confirmed patellar tendinitis. I did review the technique which was good. I am however willing to do a approach from the undersurface with Hoffa's fat pad stripping. She will call back and make an appointment for PRP when she is ready. Adding Ultracet for use as needed, her pain is predominantly during strenuous activities.

## 2015-11-09 NOTE — Progress Notes (Signed)
  Subjective:    CC: Follow-up  HPI: Left patellar tendinitis: Has had multiple PRP injections, all from a superficial to deep approach, I did offer and underside approach and she is going to think about it.  Lumbar spondylosis: Has done well with regards to discogenic radicular pain with an epidural but had far greater relief with a bilateral L4-L5 facet injection. Overall doing well but is going to want to do another set of facet injections in a month or so. Also having pain at the right sacroiliac joint, I did agree inject this in the future if needed.  Left plantar fasciitis: Doing well, may need another injection before Thanksgiving.  Past medical history, Surgical history, Family history not pertinant except as noted below, Social history, Allergies, and medications have been entered into the medical record, reviewed, and no changes needed.   Review of Systems: No fevers, chills, night sweats, weight loss, chest pain, or shortness of breath.   Objective:    General: Well Developed, well nourished, and in no acute distress.  Neuro: Alert and oriented x3, extra-ocular muscles intact, sensation grossly intact.  HEENT: Normocephalic, atraumatic, pupils equal round reactive to light, neck supple, no masses, no lymphadenopathy, thyroid nonpalpable.  Skin: Warm and dry, no rashes. Cardiac: Regular rate and rhythm, no murmurs rubs or gallops, no lower extremity edema.  Respiratory: Clear to auscultation bilaterally. Not using accessory muscles, speaking in full sentences. Back Exam:  Inspection: Unremarkable  Motion: Flexion 45 deg, Extension 45 deg, Side Bending to 45 deg bilaterally,  Rotation to 45 deg bilaterally  SLR laying: Negative  XSLR laying: Negative  Palpable tenderness: Right sacroiliac joint. FABER: negative. Sensory change: Gross sensation intact to all lumbar and sacral dermatomes.  Reflexes: 2+ at both patellar tendons, 2+ at achilles tendons, Babinski's downgoing.    Strength at foot  Plantar-flexion: 5/5 Dorsi-flexion: 5/5 Eversion: 5/5 Inversion: 5/5  Leg strength  Quad: 5/5 Hamstring: 5/5 Hip flexor: 5/5 Hip abductors: 5/5  Gait unremarkable.  Impression and Recommendations:    Patellar tendinosis of left knee Intermittent pain, with MRI confirmed patellar tendinitis. I did review the technique which was good. I am however willing to do a approach from the undersurface with Hoffa's fat pad stripping. She will call back and make an appointment for PRP when she is ready. Adding Ultracet for use as needed, her pain is predominantly during strenuous activities.  Lumbar spondylosis Good response to axial discogenic and radicular pain with her epidural. She then had good results with her axial facetogenic pain with a bilateral L4-L5 facet injection. She still has some pain that she localizes over the right sacroiliac joint which is not bad enough to consider an injection for today. We are going to set her up for another set of bilateral L4-L5 facet injections with Dr. Alfredo BattyMattern, she will schedule it for approximately a month from now. She will also make an appointment before Thanksgiving to have her right SI joint injected.  Plantar fasciitis, left Doing well but I am happy to do repeat injection before Thanksgiving.  I spent 40 minutes with this patient, greater than 50% was face-to-face time counseling regarding the above diagnoses

## 2015-11-09 NOTE — Assessment & Plan Note (Signed)
Good response to axial discogenic and radicular pain with her epidural. She then had good results with her axial facetogenic pain with a bilateral L4-L5 facet injection. She still has some pain that she localizes over the right sacroiliac joint which is not bad enough to consider an injection for today. We are going to set her up for another set of bilateral L4-L5 facet injections with Dr. Alfredo BattyMattern, she will schedule it for approximately a month from now. She will also make an appointment before Thanksgiving to have her right SI joint injected.

## 2015-11-09 NOTE — Assessment & Plan Note (Signed)
Doing well but I am happy to do repeat injection before Thanksgiving.

## 2015-12-06 ENCOUNTER — Encounter: Payer: Self-pay | Admitting: Sports Medicine

## 2015-12-06 ENCOUNTER — Ambulatory Visit (INDEPENDENT_AMBULATORY_CARE_PROVIDER_SITE_OTHER): Payer: Federal, State, Local not specified - PPO | Admitting: Sports Medicine

## 2015-12-06 DIAGNOSIS — M47816 Spondylosis without myelopathy or radiculopathy, lumbar region: Secondary | ICD-10-CM | POA: Diagnosis not present

## 2015-12-06 DIAGNOSIS — L600 Ingrowing nail: Secondary | ICD-10-CM | POA: Diagnosis not present

## 2015-12-06 DIAGNOSIS — M722 Plantar fascial fibromatosis: Secondary | ICD-10-CM | POA: Diagnosis not present

## 2015-12-06 NOTE — Assessment & Plan Note (Signed)
Good response to axial discogenic and radicular pain with the epidural, good results to axial facetogenic pain with a bilateral L4-L5 facet injection. Does have facet injections coming up, after which we can also proceed with a right SI joint injection. There is a trip coming up November 19.

## 2015-12-06 NOTE — Progress Notes (Signed)
  Subjective:    CC: follow-up to discuss upcoming injections   HPI: 65 yo with history of lumbar spondylosis, plantar fasciitis, ingrown toenail presenting to discuss upcoming injections.  She is going on a cruise in one month and would like to receive an SI injection and plantar fascia injection before then, as well as have an ingrown toenail removed.  She mentions that she is getting a facet epidural injection this Friday.  She wants to have her L great and second toenail removed before her cruise.        Past medical history:  Negative.  See flowsheet/record as well for more information.  Surgical history: Negative.  See flowsheet/record as well for more information.  Family history: Negative.  See flowsheet/record as well for more information.  Social history: Negative.  See flowsheet/record as well for more information.  Allergies, and medications have been entered into the medical record, reviewed, and no changes needed.   Review of Systems: No fevers, chills, night sweats, weight loss, chest pain, or shortness of breath.   Objective:    General: Well Developed, well nourished, and in no acute distress.  Neuro: Alert and oriented x3, extra-ocular muscles intact, sensation grossly intact.  HEENT: Normocephalic, atraumatic, pupils equal round reactive to light, neck supple, no masses, no lymphadenopathy, thyroid nonpalpable.  Skin: Warm and dry, no rashes. Cardiac: Regular rate and rhythm, no murmurs rubs or gallops, no lower extremity edema.  Respiratory: Clear to auscultation bilaterally. Not using accessory muscles, speaking in full sentences.   Impression and Recommendations:    1. Ingrown L great toe and second toe  -Return next week for medial nail plate excision of great and second L  toe  2. R SI injection  -Will perform injection in 2-3 weeks   3. L plantar fasciitis -Will perform injection in 2-3 weeks when receives SI joint injection

## 2015-12-06 NOTE — Assessment & Plan Note (Signed)
Will return for injection before trip in November 19.

## 2015-12-06 NOTE — Assessment & Plan Note (Signed)
Patient will return for medial nail plate excision on her great toenail and medial and lateral nail plate excision on the second toe, both with phenol.

## 2015-12-10 ENCOUNTER — Ambulatory Visit
Admission: RE | Admit: 2015-12-10 | Discharge: 2015-12-10 | Disposition: A | Discharge status: Home / Self Care | Payer: Federal, State, Local not specified - PPO | Source: Ambulatory Visit | Attending: Sports Medicine | Admitting: Sports Medicine

## 2015-12-10 MED ORDER — METHYLPREDNISOLONE ACETATE 40 MG/ML INJ SUSP (RADIOLOG
120.0000 mg | Freq: Once | INTRAMUSCULAR | Status: AC
  Administered 2015-12-10: 120 mg via INTRA_ARTICULAR

## 2015-12-10 MED ORDER — IOPAMIDOL (ISOVUE-M 200) INJECTION 41%
1.0000 mL | Freq: Once | INTRAMUSCULAR | Status: AC
  Administered 2015-12-10: 1 mL via INTRA_ARTICULAR

## 2015-12-10 NOTE — Discharge Instructions (Signed)

## 2015-12-13 ENCOUNTER — Ambulatory Visit (INDEPENDENT_AMBULATORY_CARE_PROVIDER_SITE_OTHER): Payer: Federal, State, Local not specified - PPO | Admitting: Sports Medicine

## 2015-12-13 ENCOUNTER — Encounter: Payer: Self-pay | Admitting: Sports Medicine

## 2015-12-13 DIAGNOSIS — L600 Ingrowing nail: Secondary | ICD-10-CM

## 2015-12-13 MED ORDER — HYDROCODONE-ACETAMINOPHEN 5-325 MG PO TABS: 1.0000 | ORAL_TABLET | Freq: Three times a day (TID) | ORAL | 0 refills | Status: DC | PRN

## 2015-12-13 NOTE — Patient Instructions (Signed)
Fingernail or Toenail Removal, Care After Refer to this sheet in the next few weeks. These instructions provide you with information about caring for yourself after your procedure. Your health care provider may also give you more specific instructions. Your treatment has been planned according to current medical practices, but problems sometimes occur. Call your health care provider if you have any problems or questions after your procedure. WHAT TO EXPECT AFTER THE PROCEDURE After your procedure, it is common to have:  Redness.  Swelling. HOME CARE INSTRUCTIONS  If you have a splint on your finger:  Wear it as directed by your health care provider. Remove it only as directed by your health care provider.  Loosen the splint if your fingers become numb and tingle, or if they turn cold and blue.  If you were given a surgical shoe, wear it as directed by your health care provider.  Take medicines only as directed by your health care provider.  Elevate your hand or foot as much of the time as possible. This helps with pain and swelling.  If you are recovering from fingernail removal, keep your hand raised above the level of your heart.  If you are recovering from toenail removal, lie on a bed or a couch with your leg propped up on pillows, or sit in a reclining chair with the footrest up.  Follow instructions from your health care provider about bandage (dressing) changes and removal:  Change your dressing 24 hours after your procedure or as directed by your health care provider.  Soak your hand or foot in warm, soapy water for 10-20 minutes or as directed by your health care provider. Do this 3 times per day or as directed by your health care provider. This reduces pain and swelling.  After you soak your hand or foot, apply a clean, dry dressing.  Keep your dressing clean and dry. Change your dressing whenever it gets wet or dirty.  Keep all follow-up visits as directed by your  health care provider. This is important. SEEK MEDICAL CARE IF:  You have increased redness or pain at your nail area.  You have increased fluid, blood, or pus coming from your nail area.  There is a bad smell coming from the dressing.  You have a fever.  Your swelling gets worse, or you have swelling that spreads from your finger to your hand or from your toe to your foot.  You have worsening redness that spreads from your finger to your hand or from your toe up to your foot.  Your finger or toe looks blue or black.   This information is not intended to replace advice given to you by your health care provider. Make sure you discuss any questions you have with your health care provider.   Document Released: 03/06/2014 Document Reviewed: 03/06/2014 Elsevier Interactive Patient Education 2016 [employer]  

## 2015-12-13 NOTE — Progress Notes (Signed)
  Procedure:  Removal of left great medial nail plate. Risks, benefits, alternatives explained to patient. Consent obtained. Time out conducted. Noted no overlying induration or erythema at site of injection. Toe cleaned with alcohol, then a total of 8cc lidocaine 2% infiltrated at adjacent webspaces at the location of the bifurcation of the common digital nerve to proper digital nerves.  Some lidocaine also infiltrated at hyponychium and under nail bed.  Adequate anesthesia ensured. Toe prepped and draped in a sterile fashion. Nail elevator used to separate nail plate from nail bed. Clippers used to cut toenail in a longitudinal fashion to proximal nail fold and matrix. Hemostat then used to separate nail fragment from surrounding structures. Nail bed and matrix treated. Minor bleeding controlled with pressure and phenol. Antibiotic ointment applied. Toe dressed. Advised to return if increased redness, swelling, drainage, fevers, or chills.  Procedure:  Removal of left second medial and lateral nail plates. Risks, benefits, alternatives explained to patient. Consent obtained. Time out conducted. Noted no overlying induration or erythema at site of injection. Toe cleaned with alcohol, then a total of 2cc lidocaine 2% infiltrated at adjacent webspaces at the location of the bifurcation of the common digital nerve to proper digital nerves.  Some lidocaine also infiltrated at hyponychium and under nail bed.  Adequate anesthesia ensured. Toe prepped and draped in a sterile fashion. Nail elevator used to separate nail plate from nail bed. Clippers used to cut toenail in a longitudinal fashion to proximal nail fold and matrix. Hemostat then used to separate nail fragment from surrounding structures. Nail bed and matrix treated. Minor bleeding controlled with pressure and phenol. Antibiotic ointment applied. Toe dressed. Advised to return if increased redness, swelling, drainage, fevers, or  chills.

## 2015-12-13 NOTE — Assessment & Plan Note (Signed)
Removed the left medial great nail plate as well as the left second medial and lateral nail plate, all structures were treated with phenol. Hydrocodone for pain. Return to see me in one week for wound check.

## 2015-12-20 ENCOUNTER — Ambulatory Visit (INDEPENDENT_AMBULATORY_CARE_PROVIDER_SITE_OTHER): Payer: Federal, State, Local not specified - PPO | Admitting: Sports Medicine

## 2015-12-20 DIAGNOSIS — L600 Ingrowing nail: Secondary | ICD-10-CM | POA: Diagnosis not present

## 2015-12-20 NOTE — Assessment & Plan Note (Signed)
Doing well after left medial great nail plate as well as left second medial and lateral nail plate excisions with phenol. Pain is improving significantly, we did discuss some local wound care.

## 2015-12-20 NOTE — Progress Notes (Signed)
  Subjective:    CC: Follow-up multiple issues  HPI: Patellar tendinitis: Is going to schedule her PRP, we will taken underside approach and be very aggressive at the insertion.  Plantar fasciitis: Is going to schedule plantar fascia injection before a trip.  Ingrown toenails: Doing well.  Past medical history:  Negative.  See flowsheet/record as well for more information.  Surgical history: Negative.  See flowsheet/record as well for more information.  Family history: Negative.  See flowsheet/record as well for more information.  Social history: Negative.  See flowsheet/record as well for more information.  Allergies, and medications have been entered into the medical record, reviewed, and no changes needed.   Review of Systems: No fevers, chills, night sweats, weight loss, chest pain, or shortness of breath.   Objective:    General: Well Developed, well nourished, and in no acute distress.  Neuro: Alert and oriented x3, extra-ocular muscles intact, sensation grossly intact.  HEENT: Normocephalic, atraumatic, pupils equal round reactive to light, neck supple, no masses, no lymphadenopathy, thyroid nonpalpable.  Skin: Warm and dry, no rashes. Cardiac: Regular rate and rhythm, no murmurs rubs or gallops, no lower extremity edema.  Respiratory: Clear to auscultation bilaterally. Not using accessory muscles, speaking in full sentences. Toenails: Well healed, no evidence of bacterial superinfection.  Impression and Recommendations:    Ingrown left big toenail Doing well after left medial great nail plate as well as left second medial and lateral nail plate excisions with phenol. Pain is improving significantly, we did discuss some local wound care.

## 2016-01-10 ENCOUNTER — Encounter: Payer: Self-pay | Admitting: Sports Medicine

## 2016-01-10 ENCOUNTER — Ambulatory Visit (INDEPENDENT_AMBULATORY_CARE_PROVIDER_SITE_OTHER): Payer: Federal, State, Local not specified - PPO | Admitting: Sports Medicine

## 2016-01-10 DIAGNOSIS — M47816 Spondylosis without myelopathy or radiculopathy, lumbar region: Secondary | ICD-10-CM | POA: Diagnosis not present

## 2016-01-10 DIAGNOSIS — M722 Plantar fascial fibromatosis: Secondary | ICD-10-CM

## 2016-01-10 NOTE — Assessment & Plan Note (Signed)
Injection as above 

## 2016-01-10 NOTE — Progress Notes (Signed)
  Subjective:    CC: Follow-up  HPI: Plantar fasciitis: About to leave for her trip, needs a left plantar fascia injection, pain is moderate, persistent, localized without radiation.  Low back pain: Good response is to epidural and facet injections, having some SI joint pain on the left side. Desires SI joint injection.  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  Past medical history:  Negative.  See flowsheet/record as well for more information.  Surgical history: Negative.  See flowsheet/record as well for more information.  Family history: Negative.  See flowsheet/record as well for more information.  Social history: Negative.  See flowsheet/record as well for more information.  Allergies, and medications have been entered into the medical record, reviewed, and no changes needed.   Review of Systems: No fevers, chills, night sweats, weight loss, chest pain, or shortness of breath.   Objective:    General: Well Developed, well nourished, and in no acute distress.  Neuro: Alert and oriented x3, extra-ocular muscles intact, sensation grossly intact.  HEENT: Normocephalic, atraumatic, pupils equal round reactive to light, neck supple, no masses, no lymphadenopathy, thyroid nonpalpable.  Skin: Warm and dry, no rashes. Cardiac: Regular rate and rhythm, no murmurs rubs or gallops, no lower extremity edema.  Respiratory: Clear to auscultation bilaterally. Not using accessory muscles, speaking in full sentences.  Procedure: Real-time Ultrasound Guided Injection of left plantar fascia Device: GE Logiq E  Verbal informed consent obtained.  Time-out conducted.  Noted no overlying erythema, induration, or other signs of local infection.  Skin prepped in a sterile fashion.  Local anesthesia: Topical Ethyl chloride.  With sterile technique and under real time ultrasound  guidance:  25-gauge needle advanced to the origin of the plantar fascia calcaneus, 1 mL kenalog 40, 1 mL lidocaine, 1 mL Marcaine injected Completed without difficulty  Pain immediately resolved suggesting accurate placement of the medication.  Advised to call if fevers/chills, erythema, induration, drainage, or persistent bleeding.  Images permanently stored and available for review in the ultrasound unit.  Impression: Technically successful ultrasound guided injection.  Procedure: Real-time Ultrasound Guided Injection of right SI joint Device: GE Logiq E  Verbal informed consent obtained.  Time-out conducted.  Noted no overlying erythema, induration, or other signs of local infection.  Skin prepped in a sterile fashion.  Local anesthesia: Topical Ethyl chloride.  With sterile technique and under real time ultrasound guidance:  Taking care to avoid the S1 foramen I guided the needle deep into the SI joint and 1 mL kenalog 40, 2 mL lidocaine, 2 mL Marcaine injected easily. Completed without difficulty  Pain immediately resolved suggesting accurate placement of the medication.  Advised to call if fevers/chills, erythema, induration, drainage, or persistent bleeding.  Images permanently stored and available for review in the ultrasound unit.  Impression: Technically successful ultrasound guided injection.  Impression and Recommendations:    Plantar fasciitis, left Injection as above.  Lumbar spondylosis Good response to axial discogenic and radicular pain with the epidural, and good results axial facetogenic pain with a bilateral L4-L5 facet joint injection. We are also proceeding with a right SI joint injection today.

## 2016-01-10 NOTE — Assessment & Plan Note (Signed)
Good response to axial discogenic and radicular pain with the epidural, and good results axial facetogenic pain with a bilateral L4-L5 facet joint injection. We are also proceeding with a right SI joint injection today.

## 2016-01-14 ENCOUNTER — Telehealth: Payer: Self-pay

## 2016-01-14 MED ORDER — PREDNISONE 50 MG PO TABS: 50.0000 mg | ORAL_TABLET | Freq: Every day | ORAL | 0 refills | Status: DC

## 2016-01-14 NOTE — Telephone Encounter (Signed)
Left VM stating SI injection isn't working and she's about to leave on her trip. Please assist.

## 2016-01-14 NOTE — Telephone Encounter (Signed)
I will have any options other than a burst of prednisone, I'm going to call this in.

## 2016-01-17 NOTE — Telephone Encounter (Signed)
Pt.notified

## 2016-02-03 ENCOUNTER — Other Ambulatory Visit: Payer: Self-pay | Admitting: Sports Medicine

## 2016-02-25 ENCOUNTER — Ambulatory Visit (INDEPENDENT_AMBULATORY_CARE_PROVIDER_SITE_OTHER): Payer: Federal, State, Local not specified - PPO | Admitting: Sports Medicine

## 2016-02-25 ENCOUNTER — Ambulatory Visit: Payer: Federal, State, Local not specified - PPO | Admitting: Sports Medicine

## 2016-02-25 DIAGNOSIS — M7652 Patellar tendinitis, left knee: Secondary | ICD-10-CM

## 2016-02-25 MED ORDER — OXYCODONE-ACETAMINOPHEN 10-325 MG PO TABS: ORAL_TABLET | ORAL | 0 refills | Status: DC

## 2016-02-25 NOTE — Assessment & Plan Note (Signed)
With MRI confirmed patellar tendinosis, [facility], and affected. We repeated it today from an under the patellar tendon approach. I also stripped Hoffa's fat pad. Return to see me in one month, she will start physical therapy in approximately 2 weeks. Percocet for pain. Avoid NSAIDs. She does understand how painful this is going to be.

## 2016-02-25 NOTE — Progress Notes (Signed)
   Procedure: Real-time Ultrasound Guided Platelet Rich Plasma (PRP) Injection of  Device: GE Logiq E  Verbal informed consent obtained.  Time-out conducted.  Noted no overlying erythema, induration, or other signs of local infection.  Obtained 30 cc of blood from peripheral vein, using the "PEAK" centrifuge, red blood cells were separated from the plasma. Subsequently red blood cells were drained leaving only plasma with the buffy coat layer between the desired lines. Platelet poor plasma was then centrifuged out, and remaining platelet rich plasma aspirated into a 5 cc syringe.  Skin prepped in a sterile fashion.  Local anesthesia: Topical Ethyl chloride.  With sterile technique and under real time ultrasound guidance the platelet rich plasma (PRP) obtained above:  I injected 2 mL lidocaine, 2 mL Marcaine through a 22-gauge needle just deep to the patellar tendon, syringe was switched and I then injected the 3 mL of platelet rich plasma into, and under the deep insertion of the patellar tendon on the patella, I then also stripped Hoffa's fat pad from the undersurface of the patellar tendon. Completed without difficulty  Advised to call if fevers/chills, erythema, induration, drainage, or persistent bleeding.  Images permanently stored and available for review in the ultrasound unit.  Impression: Technically successful ultrasound guided Platelet Rich Plasma (PRP) injection.  I spent 40 minutes with this patient, greater than 50% was face-to-face time counseling and procedural time regarding the below diagnosis.

## 2016-03-02 ENCOUNTER — Telehealth: Payer: Self-pay

## 2016-03-02 NOTE — Telephone Encounter (Signed)
Pt called asking questions post PRP. Pt states she doesn't feel the shot hasn't done anything and she is doing PT.

## 2016-03-03 ENCOUNTER — Ambulatory Visit: Payer: Federal, State, Local not specified - PPO | Admitting: Sports Medicine

## 2016-03-06 ENCOUNTER — Encounter: Payer: Self-pay | Admitting: Sports Medicine

## 2016-03-06 ENCOUNTER — Ambulatory Visit (INDEPENDENT_AMBULATORY_CARE_PROVIDER_SITE_OTHER): Payer: Federal, State, Local not specified - PPO | Admitting: Sports Medicine

## 2016-03-06 DIAGNOSIS — M7652 Patellar tendinitis, left knee: Secondary | ICD-10-CM

## 2016-03-06 DIAGNOSIS — M47816 Spondylosis without myelopathy or radiculopathy, lumbar region: Secondary | ICD-10-CM

## 2016-03-06 NOTE — Progress Notes (Signed)
  Subjective:    CC: Follow-up  HPI: This is a pleasant 66 year old female with multifactorial back pain, she did well with regards to her axial discogenic back pain as well as radicular pain with lumbar epidural, did well with her axial facetogenic pain with of facet joint injection. We injected her SI joint and this also provided some temporary relief of the pain localized at her SI joint with radiation down to the thigh. Unfortunately she is having recurrence of pain.  She is looking for the next available option for taking care of her SI joint pain. She is fairly adamant that her current pain is not from her facet or disc.  Left patellar tendinitis: Now approximately a week and a half post PRP injection at the deep patellar tendon origin as well as with Hoffa's fat pad stripping under ultrasound guidance with the tip of the needle. Still having some pain as expected.  Past medical history:  Negative.  See flowsheet/record as well for more information.  Surgical history: Negative.  See flowsheet/record as well for more information.  Family history: Negative.  See flowsheet/record as well for more information.  Social history: Negative.  See flowsheet/record as well for more information.  Allergies, and medications have been entered into the medical record, reviewed, and no changes needed.   Review of Systems: No fevers, chills, night sweats, weight loss, chest pain, or shortness of breath.   Objective:    General: Well Developed, well nourished, and in no acute distress.  Neuro: Alert and oriented x3, extra-ocular muscles intact, sensation grossly intact.  HEENT: Normocephalic, atraumatic, pupils equal round reactive to light, neck supple, no masses, no lymphadenopathy, thyroid nonpalpable.  Skin: Warm and dry, no rashes. Cardiac: Regular rate and rhythm, no murmurs rubs or gallops, no lower extremity edema.  Respiratory: Clear to auscultation bilaterally. Not using accessory muscles,  speaking in full sentences.  Impression and Recommendations:    Lumbar spondylosis Good response to axial discogenic and radicular pain with an epidural. Good response to axial facetogenic pain with a bilateral L4-L5 facet injection. Right SI joint injection approximately 2-1/2 months ago did not provide much relief past the numbing. She is going to look into right SI joint radiofrequency ablation, and even if this is a reasonable option, I would like her to discuss this with Kathleen [name].  Patellar tendinosis of left knee Doing well after second session of PRP on left patellar tendinosis. Hoffa's fat pad was also stripped under ultrasound guidance. I would like to see her back in a month for this. She is going to start physical therapy.  I spent 25 minutes with this patient, greater than 50% was face-to-face time counseling regarding the above diagnoses

## 2016-03-06 NOTE — Assessment & Plan Note (Signed)
Doing well after second session of PRP on left patellar tendinosis. Hoffa's fat pad was also stripped under ultrasound guidance. I would like to see her back in a month for this. She is going to start physical therapy.

## 2016-03-06 NOTE — Assessment & Plan Note (Signed)
Good response to axial discogenic and radicular pain with an epidural. Good response to axial facetogenic pain with a bilateral L4-L5 facet injection. Right SI joint injection approximately 2-1/2 months ago did not provide much relief past the numbing. She is going to look into right SI joint radiofrequency ablation, and even if this is a reasonable option, I would like her to discuss this with Dr. Cherylann BanasMeloy.

## 2016-03-06 NOTE — Patient Instructions (Signed)
Looking to SI joint radiofrequency ablation

## 2016-03-24 ENCOUNTER — Ambulatory Visit (INDEPENDENT_AMBULATORY_CARE_PROVIDER_SITE_OTHER): Payer: Federal, State, Local not specified - PPO | Admitting: Sports Medicine

## 2016-03-24 ENCOUNTER — Encounter: Payer: Self-pay | Admitting: Sports Medicine

## 2016-03-24 DIAGNOSIS — M47816 Spondylosis without myelopathy or radiculopathy, lumbar region: Secondary | ICD-10-CM

## 2016-03-24 DIAGNOSIS — M7652 Patellar tendinitis, left knee: Secondary | ICD-10-CM | POA: Diagnosis not present

## 2016-03-24 NOTE — Assessment & Plan Note (Signed)
Good response to axial discogenic and radicular pain with her epidural. Good response to axial facetogenic pain with a bilateral L4-L5 facet injection. Right SI joint injection approximately 3 months ago did not provide much relief past the initial numbing. She is going to look into SI joint radio frequency ablation after medial branch blocks. She has picked another pain doctor she would like to discuss this with.

## 2016-03-24 NOTE — Assessment & Plan Note (Signed)
Just over 1 month post PRP for a left patellar tendinosis with Hoffa's fat pad stripping under ultrasound guidance. Pain is variable but ranges from a 2 to an 8, but better than before the procedure. She has started physical therapy, I do anticipate 3 months plus for healing after the procedure.

## 2016-03-24 NOTE — Progress Notes (Signed)
.   Subjective:    CC: follow-up  HPI: Lumbar spondylosis: Has had multiple injections, all documented below, currently still having SI joint type pain. She did well during the initial several hours after the SI joint injection. She is interested in looking  Into SI joint medial branch blocks and RFA.  Patellar tendinosis: PRP injection a month ago has provided some degree of relief, she has good days, bad days but understands that it's going to take months.  Past medical history:  Negative.  See flowsheet/record as well for more information.  Surgical history: Negative.  See flowsheet/record as well for more information.  Family history: Negative.  See flowsheet/record as well for more information.  Social history: Negative.  See flowsheet/record as well for more information.  Allergies, and medications have been entered into the medical record, reviewed, and no changes needed.   Review of Systems: No fevers, chills, night sweats, weight loss, chest pain, or shortness of breath.   Objective:    General: Well Developed, well nourished, and in no acute distress.  Neuro: Alert and oriented x3, extra-ocular muscles intact, sensation grossly intact.  HEENT: Normocephalic, atraumatic, pupils equal round reactive to light, neck supple, no masses, no lymphadenopathy, thyroid nonpalpable.  Skin: Warm and dry, no rashes. Cardiac: Regular rate and rhythm, no murmurs rubs or gallops, no lower extremity edema.  Respiratory: Clear to auscultation bilaterally. Not using accessory muscles, speaking in full sentences.  Impression and Recommendations:    Patellar tendinosis of left knee Just over 1 month post PRP for a left patellar tendinosis with Hoffa's fat pad stripping under ultrasound guidance. Pain is variable but ranges from a 2 to an 8, but better than before the procedure. She has started physical therapy, I do anticipate 3 months plus for healing after the procedure.  Lumbar  spondylosis Good response to axial discogenic and radicular pain with her epidural. Good response to axial facetogenic pain with a bilateral L4-L5 facet injection. Right SI joint injection approximately 3 months ago did not provide much relief past the initial numbing. She is going to look into SI joint radio frequency ablation after medial branch blocks. She has picked another pain doctor she would like to discuss this with.  I spent 25 minutes with this patient, greater than 50% was face-to-face time counseling regarding the above diagnoses

## 2016-04-07 ENCOUNTER — Ambulatory Visit (INDEPENDENT_AMBULATORY_CARE_PROVIDER_SITE_OTHER): Payer: Federal, State, Local not specified - PPO | Admitting: Sports Medicine

## 2016-04-07 DIAGNOSIS — L600 Ingrowing nail: Secondary | ICD-10-CM | POA: Diagnosis not present

## 2016-04-07 MED ORDER — TERBINAFINE HCL 250 MG PO TABS: 250.0000 mg | ORAL_TABLET | Freq: Every day | ORAL | 1 refills | Status: AC

## 2016-04-07 MED ORDER — DOXYCYCLINE HYCLATE 100 MG PO TABS: 100.0000 mg | ORAL_TABLET | Freq: Two times a day (BID) | ORAL | 0 refills | Status: AC

## 2016-04-07 NOTE — Progress Notes (Signed)
  Subjective:    CC: Follow-up   HPI: Ingrown toenail: We will removed the left great medial nail plate as well as left second medial and lateral nail plates, overall doing well, she is having some thickening and yellowing.  Her dermatologist to do a fungal scraping which ultimately was negative per patient report. At the lateral nail fold of her second toe she does have some erythema and pain.  Past medical history:  Negative.  See flowsheet/record as well for more information.  Surgical history: Negative.  See flowsheet/record as well for more information.  Family history: Negative.  See flowsheet/record as well for more information.  Social history: Negative.  See flowsheet/record as well for more information.  Allergies, and medications have been entered into the medical record, reviewed, and no changes needed.   Review of Systems: No fevers, chills, night sweats, weight loss, chest pain, or shortness of breath.   Objective:    General: Well Developed, well nourished, and in no acute distress.  Neuro: Alert and oriented x3, extra-ocular muscles intact, sensation grossly intact.  HEENT: Normocephalic, atraumatic, pupils equal round reactive to light, neck supple, no masses, no lymphadenopathy, thyroid nonpalpable.  Skin: Warm and dry, no rashes. Cardiac: Regular rate and rhythm, no murmurs rubs or gallops, no lower extremity edema.  Respiratory: Clear to auscultation bilaterally. Not using accessory muscles, speaking in full sentences. Left Foot: No visible erythema or swelling. Range of motion is full in all directions. Strength is 5/5 in all directions. No hallux valgus. No pes cavus or pes planus. No abnormal callus noted. No pain over the navicular prominence, or base of fifth metatarsal. No tenderness to palpation of the calcaneal insertion of plantar fascia. No pain at the Achilles insertion. No pain over the calcaneal bursa. No pain of the retrocalcaneal bursa. No  tenderness to palpation over the tarsals, metatarsals, or phalanges. No hallux rigidus or limitus. No tenderness palpation over interphalangeal joints. No pain with compression of the metatarsal heads. Neurovascularly intact distally. Toes look good, there is some thickening and yellowing of the nail plates, consistent with onychomycosis.  Impression and Recommendations:    Ingrown left big toenail Overall doing well, there is some evidence of onychomycosis, she had a negative fungal scraping with her dermatologist, this is not clearly sensitive however so we are going to treat empirically with Lamisil, we can recheck LFTs in one month. She does have some mild paronychia on the left second lateral nailfold.  Adding doxycycline for this.  I spent 25 minutes with this patient, greater than 50% was face-to-face time counseling regarding the above diagnoses

## 2016-04-07 NOTE — Assessment & Plan Note (Signed)
Overall doing well, there is some evidence of onychomycosis, she had a negative fungal scraping with her dermatologist, this is not clearly sensitive however so we are going to treat empirically with Lamisil, we can recheck LFTs in one month. She does have some mild paronychia on the left second lateral nailfold.  Adding doxycycline for this.

## 2016-04-19 ENCOUNTER — Encounter: Payer: Self-pay | Admitting: Sports Medicine

## 2016-04-19 ENCOUNTER — Ambulatory Visit (INDEPENDENT_AMBULATORY_CARE_PROVIDER_SITE_OTHER): Payer: Federal, State, Local not specified - PPO | Admitting: Sports Medicine

## 2016-04-19 DIAGNOSIS — L600 Ingrowing nail: Secondary | ICD-10-CM

## 2016-04-19 NOTE — Assessment & Plan Note (Signed)
Overall doing well, she did have some evidence of onychomycosis, there was apparently a negative fungal scraping with her dermatologist however this is not perfectly sensitive. A week ago we started empiric treatment with Lamisil, she also had some mild paronychia on the left second lateral nailfold, this was treated with doxycycline which she has finished. Continues to have sensitivity at the left second lateral nailfold, I do think this will simply need time to improve. Considering her continued pain and concern I would like her to have a second opinion from podiatry.

## 2016-04-19 NOTE — Progress Notes (Signed)
  Subjective:    CC: Persistent toe pain  HPI: Kathleen [name] returns, she is had a lot going on with her left foot, onychomycosis which is currently being successfully treated with Lamisil, she had a left second lateral paronychia, seems to be treated appropriately with doxycycline however she continues to have some sensitivity and soreness at the lateral second nail full. At this point she is somewhat frustrated with the persistence of pain.  Past medical history:  Negative.  See flowsheet/record as well for more information.  Surgical history: Negative.  See flowsheet/record as well for more information.  Family history: Negative.  See flowsheet/record as well for more information.  Social history: Negative.  See flowsheet/record as well for more information.  Allergies, and medications have been entered into the medical record, reviewed, and no changes needed.   Review of Systems: No fevers, chills, night sweats, weight loss, chest pain, or shortness of breath.   Objective:    General: Well Developed, well nourished, and in no acute distress.  Neuro: Alert and oriented x3, extra-ocular muscles intact, sensation grossly intact.  HEENT: Normocephalic, atraumatic, pupils equal round reactive to light, neck supple, no masses, no lymphadenopathy, thyroid nonpalpable.  Skin: Warm and dry, no rashes. Cardiac: Regular rate and rhythm, no murmurs rubs or gallops, no lower extremity edema.  Respiratory: Clear to auscultation bilaterally. Not using accessory muscles, speaking in full sentences. Left foot: Left second toe appears unremarkable, no persistence of erythema, she does have tenderness at the lateral nailfold. There is some onychodystrophy that seems to be growing out. There appears to be a healthy new nail at the proximal nailfold.  Impression and Recommendations:    Ingrown left big toenail, onychomycosis, and left second paronychia Overall doing well, she did have some evidence of  onychomycosis, there was apparently a negative fungal scraping with her dermatologist however this is not perfectly sensitive. A week ago we started empiric treatment with Lamisil, she also had some mild paronychia on the left second lateral nailfold, this was treated with doxycycline which she has finished. Continues to have sensitivity at the left second lateral nailfold, I do think this will simply need time to improve. Considering her continued pain and concern I would like her to have a second opinion from podiatry.

## 2016-04-27 ENCOUNTER — Encounter: Payer: Self-pay | Admitting: Podiatry

## 2016-04-27 ENCOUNTER — Ambulatory Visit (INDEPENDENT_AMBULATORY_CARE_PROVIDER_SITE_OTHER): Payer: Federal, State, Local not specified - PPO | Admitting: Podiatry

## 2016-04-27 VITALS — BP 123/68 | HR 56 | Ht 63.0 in | Wt 119.0 lb

## 2016-04-27 DIAGNOSIS — M79675 Pain in left toe(s): Secondary | ICD-10-CM

## 2016-04-27 DIAGNOSIS — L600 Ingrowing nail: Secondary | ICD-10-CM

## 2016-04-27 DIAGNOSIS — B351 Tinea unguium: Secondary | ICD-10-CM

## 2016-04-27 DIAGNOSIS — M7989 Other specified soft tissue disorders: Secondary | ICD-10-CM

## 2016-04-27 NOTE — Progress Notes (Addendum)
SUBJECTIVE: 66 y.o. year old female presents complaining of painful nail 2nd left. Has had ingrown nail surgery done on it 3-4 months ago. Currently on Lamisil oral medication.  REVIEW OF SYSTEMS:  Reviewed medical record and noted of pertinent information.  OBJECTIVE: DERMATOLOGIC EXAMINATION: Nails: Thick dystrophic nail at distal part of 2nd digit pressing against nail borders with sign of new nail grown at proximal skin folder.  Fungal infected loose nail plate medial border left great toe. Deformed nail 2nd right.   VASCULAR EXAMINATION OF LOWER LIMBS: All pedal pulses are palpable with normal pulsation.  Capillary Filling times within 3 seconds in all digits.  No edema or erythema noted. Temperature gradient from tibial crest to dorsum of foot is within normal bilateral.  NEUROLOGIC EXAMINATION OF THE LOWER LIMBS: All epicritic and tactile sensations grossly intact.  MUSCULOSKELETAL EXAMINATION: Long 2nd toe bilateral.  ASSESSMENT: Abnormal loose nail plate with pain on 2nd toe left. Post nail surgery with new nail growth 1st and 2nd left. Fungal nail left great toe. Under antifungal oral medication.  PLAN: Reviewed findings. All loose nails debrided. Return as needed.

## 2016-04-27 NOTE — Patient Instructions (Signed)
Seen for painful nail 2nd left following nail surgery. Noted of old nail plate pressing against the nail groove and distal end of skin.  Abnormal nails and loose nails debrided. Keep the nails short, continue with Lamisil, use polish only for short duration. Return as needed.

## 2016-05-01 ENCOUNTER — Ambulatory Visit: Payer: Self-pay | Admitting: Podiatry

## 2016-05-06 LAB — COMPREHENSIVE METABOLIC PANEL
ALT: 17 U/L (ref 6–29)
AST: 22 U/L (ref 10–35)
Albumin: 4.5 g/dL (ref 3.6–5.1)
Alkaline Phosphatase: 51 U/L (ref 33–130)
BUN: 18 mg/dL (ref 7–25)
Chloride: 102 mmol/L (ref 98–110)
Creat: 0.73 mg/dL (ref 0.50–0.99)
Sodium: 136 mmol/L (ref 135–146)
Total Bilirubin: 0.3 mg/dL (ref 0.2–1.2)
Total Protein: 6.2 g/dL (ref 6.1–8.1)

## 2016-05-06 LAB — COMPREHENSIVE METABOLIC PANEL WITH GFR
CO2: 27 mmol/L (ref 20–31)
Calcium: 9.2 mg/dL (ref 8.6–10.4)
Glucose, Bld: 89 mg/dL (ref 65–99)
Potassium: 4.2 mmol/L (ref 3.5–5.3)

## 2016-05-12 ENCOUNTER — Other Ambulatory Visit: Payer: Self-pay | Admitting: Sports Medicine

## 2016-05-30 DIAGNOSIS — Z79899 Other long term (current) drug therapy: Secondary | ICD-10-CM | POA: Insufficient documentation

## 2016-07-04 ENCOUNTER — Ambulatory Visit (INDEPENDENT_AMBULATORY_CARE_PROVIDER_SITE_OTHER): Payer: Federal, State, Local not specified - PPO | Admitting: Sports Medicine

## 2016-07-04 ENCOUNTER — Encounter: Payer: Self-pay | Admitting: Sports Medicine

## 2016-07-04 DIAGNOSIS — M47816 Spondylosis without myelopathy or radiculopathy, lumbar region: Secondary | ICD-10-CM

## 2016-07-04 DIAGNOSIS — M7652 Patellar tendinitis, left knee: Secondary | ICD-10-CM

## 2016-07-04 DIAGNOSIS — L600 Ingrowing nail: Secondary | ICD-10-CM | POA: Diagnosis not present

## 2016-07-04 NOTE — Assessment & Plan Note (Signed)
Good response to axial discogenic and radicular pain with the epidural, good response to axial facetogenic pain with bilateral L4-L5 facet joint injections. Right SI joint injection did not provide much relief, and right SI joint lateral branch blocks did not provide consistently so she was not a candidate for RFA. Pain management was considering bilateral L3-S1 facet joint medial branch blocks and radio frequency ablation so [facility] am [facility].

## 2016-07-04 NOTE — Progress Notes (Signed)
  Subjective:    CC: Follow-up  HPI: This is a pleasant 66 year old female, she is here to follow-up several issues.  Patellar tendinitis: PRP injection was performed about 5 months ago, this was her third time having PRP, overall she tells me she's not much better. Still resistant to consider surgical intervention.  Low back pain: Multifactorial, SI joint injections and subsequent lateral branch blocks never provided consistent pain relief, her pain doctor is setting her up for L3-S1 facet joint medial branch blocks, she did respond well to a L4-L5 bilateral facet joint injection temporarily in the past. She would like to do this again but with [facility] rather than her pain provider.  Ingrown toenails: Improved after seeing podiatry and having them tramadol.  Past medical history:  Negative.  See flowsheet/record as well for more information.  Surgical history: Negative.  See flowsheet/record as well for more information.  Family history: Negative.  See flowsheet/record as well for more information.  Social history: Negative.  See flowsheet/record as well for more information.  Allergies, and medications have been entered into the medical record, reviewed, and no changes needed.   Review of Systems: No fevers, chills, night sweats, weight loss, chest pain, or shortness of breath.   Objective:    General: Well Developed, well nourished, and in no acute distress.  Neuro: Alert and oriented x3, extra-ocular muscles intact, sensation grossly intact.  HEENT: Normocephalic, atraumatic, pupils equal round reactive to light, neck supple, no masses, no lymphadenopathy, thyroid nonpalpable.  Skin: Warm and dry, no rashes. Cardiac: Regular rate and rhythm, no murmurs rubs or gallops, no lower extremity edema.  Respiratory: Clear to auscultation bilaterally. Not using accessory muscles, speaking in full sentences.  Impression and Recommendations:    Lumbar spondylosis Good response to  axial discogenic and radicular pain with the epidural, good response to axial facetogenic pain with bilateral L4-L5 facet joint injections. Right SI joint injection did not provide much relief, and right SI joint lateral branch blocks did not provide consistently so she was not a candidate for RFA. Pain management was considering bilateral L3-S1 facet joint medial branch blocks and radio frequency ablation so I am going to set this up through [facility].  Patellar tendinosis of left knee Unfortunately did not have a good response/sufficient response to a left PRP injection. She is not yet ready to consider surgical intervention by do think this is going to be the next step. She will let me know when she is ready.  Ingrown left big toenail, onychomycosis, and left second paronychia Resolved after visit with podiatry, with trimming of nails.  I spent 25 minutes with this patient, greater than 50% was face-to-face time counseling regarding the above diagnoses

## 2016-07-04 NOTE — Assessment & Plan Note (Signed)
Resolved after visit with podiatry, with trimming of nails.

## 2016-07-04 NOTE — Assessment & Plan Note (Signed)
Unfortunately did not have a good response/sufficient response to a left PRP injection. She is not yet ready to consider surgical intervention by do think this is going to be the next step. She will let me know when she is ready.

## 2016-07-05 DIAGNOSIS — R0982 Postnasal drip: Secondary | ICD-10-CM | POA: Insufficient documentation

## 2016-07-13 ENCOUNTER — Telehealth: Payer: Self-pay

## 2016-07-13 DIAGNOSIS — M47816 Spondylosis without myelopathy or radiculopathy, lumbar region: Secondary | ICD-10-CM

## 2016-07-13 NOTE — Telephone Encounter (Signed)
Orders placed for more steroid injections, the MBB is really the next step to determine if a more permanent RFA is an option, she should keep that in mind.

## 2016-07-13 NOTE — Telephone Encounter (Signed)
Pt left VM stating she would rather have more facet injections ordered instead of facet joint medial branch blocks at this time. Please advise.

## 2016-07-14 NOTE — Telephone Encounter (Signed)
Pt.notified

## 2016-07-20 ENCOUNTER — Ambulatory Visit
Admission: RE | Admit: 2016-07-20 | Discharge: 2016-07-20 | Disposition: A | Discharge status: Home / Self Care | Payer: Federal, State, Local not specified - PPO | Source: Ambulatory Visit | Attending: Sports Medicine | Admitting: Sports Medicine

## 2016-07-20 MED ORDER — IOPAMIDOL (ISOVUE-M 200) INJECTION 41%
1.0000 mL | Freq: Once | INTRAMUSCULAR | Status: AC
  Administered 2016-07-20: 1 mL via INTRA_ARTICULAR

## 2016-07-20 MED ORDER — METHYLPREDNISOLONE ACETATE 40 MG/ML INJ SUSP (RADIOLOG
120.0000 mg | Freq: Once | INTRAMUSCULAR | Status: AC
  Administered 2016-07-20: 120 mg via INTRA_ARTICULAR

## 2016-07-20 NOTE — Discharge Instructions (Signed)

## 2016-08-10 ENCOUNTER — Other Ambulatory Visit: Payer: Self-pay | Admitting: Sports Medicine

## 2016-09-07 ENCOUNTER — Ambulatory Visit (INDEPENDENT_AMBULATORY_CARE_PROVIDER_SITE_OTHER): Payer: Federal, State, Local not specified - PPO | Admitting: Sports Medicine

## 2016-09-07 ENCOUNTER — Encounter: Payer: Self-pay | Admitting: Sports Medicine

## 2016-09-07 VITALS — BP 121/68 | HR 53 | Wt 117.0 lb

## 2016-09-07 DIAGNOSIS — M4696 Unspecified inflammatory spondylopathy, lumbar region: Secondary | ICD-10-CM

## 2016-09-07 DIAGNOSIS — M47816 Spondylosis without myelopathy or radiculopathy, lumbar region: Secondary | ICD-10-CM

## 2016-09-07 DIAGNOSIS — M722 Plantar fascial fibromatosis: Secondary | ICD-10-CM | POA: Diagnosis not present

## 2016-09-07 MED ORDER — MELOXICAM 15 MG PO TABS
15.0000 mg | ORAL_TABLET | Freq: Every day | ORAL | 0 refills | Status: DC
Start: 2016-09-07 — End: 2016-10-24

## 2016-09-07 MED ORDER — GABAPENTIN 300 MG PO CAPS: 600.0000 mg | ORAL_CAPSULE | Freq: Every day | ORAL | 0 refills | Status: DC

## 2016-09-07 MED ORDER — GABAPENTIN 100 MG PO CAPS: ORAL_CAPSULE | ORAL | 0 refills | Status: DC

## 2016-09-07 NOTE — Addendum Note (Signed)
Addended by: Monica BectonHEKKEKANDAM, THOMAS J on: 09/07/2016 03:54 PM   Modules accepted: Orders

## 2016-09-07 NOTE — Progress Notes (Addendum)
    Patient was fitted for a : standard, cushioned, semi-rigid orthotic. The orthotic was heated and afterward the patient stood on the orthotic blank positioned on the orthotic stand. The patient was positioned in subtalar neutral position and 10 degrees of ankle dorsiflexion in a weight bearing stance. After completion of molding, a stable base was applied to the orthotic blank. The blank was ground to a stable position for weight bearing. Size: 9 Base: [facility] Additional Posting and Padding: None The patient ambulated these, and they were very comfortable.  I spent 40 minutes with this patient, greater than 50% was face-to-face time counseling regarding the below diagnosis.  In addition to the above time, I spent an additional 30 minutes with the patient.

## 2016-09-07 NOTE — Assessment & Plan Note (Signed)
Custom orthotics as above. 

## 2016-09-14 ENCOUNTER — Ambulatory Visit: Payer: Federal, State, Local not specified - PPO | Admitting: Sports Medicine

## 2016-09-14 ENCOUNTER — Encounter (INDEPENDENT_AMBULATORY_CARE_PROVIDER_SITE_OTHER): Payer: Self-pay

## 2016-09-14 DIAGNOSIS — Z0189 Encounter for other specified special examinations: Secondary | ICD-10-CM

## 2016-09-15 ENCOUNTER — Encounter: Payer: Self-pay | Admitting: Sports Medicine

## 2016-09-15 ENCOUNTER — Ambulatory Visit (INDEPENDENT_AMBULATORY_CARE_PROVIDER_SITE_OTHER): Payer: Federal, State, Local not specified - PPO | Admitting: Sports Medicine

## 2016-09-15 DIAGNOSIS — M47816 Spondylosis without myelopathy or radiculopathy, lumbar region: Secondary | ICD-10-CM

## 2016-09-15 DIAGNOSIS — M7652 Patellar tendinitis, left knee: Secondary | ICD-10-CM

## 2016-09-15 MED ORDER — GABAPENTIN 100 MG PO CAPS: ORAL_CAPSULE | ORAL | 3 refills | Status: DC

## 2016-09-15 MED ORDER — TRAMADOL-ACETAMINOPHEN 37.5-325 MG PO TABS: 1.0000 | ORAL_TABLET | Freq: Three times a day (TID) | ORAL | 0 refills | Status: DC | PRN

## 2016-09-15 NOTE — Assessment & Plan Note (Signed)
Response to axial and discogenic and radicular pain with epidural, good response to axial facetogenic pain with bilateral L4-L5 facet joint injections. No relief from SI joint injections or lateral branch blocks. Both pain management and us were planning L3-S1 facet joint medial branch blocks in anticipation of radial frequency ablation, she needs to wait so she comes back from [place] for this.  I'm going to change her gabapentin to 200 mg in the morning, 1-200 mg at lunch, she will continue 600 mg at bedtime. She can use tramadol as needed for breakthrough pain.

## 2016-09-15 NOTE — Progress Notes (Signed)
  Subjective:    CC:  Follow-up  HPI: Low back pain: Persistent, would like to go up on her gabapentin.  Past medical history:  Negative.  See flowsheet/record as well for more information.  Surgical history: Negative.  See flowsheet/record as well for more information.  Family history: Negative.  See flowsheet/record as well for more information.  Social history: Negative.  See flowsheet/record as well for more information.  Allergies, and medications have been entered into the medical record, reviewed, and no changes needed.   Review of Systems: No fevers, chills, night sweats, weight loss, chest pain, or shortness of breath.   Objective:    General: Well Developed, well nourished, and in no acute distress.  Neuro: Alert and oriented x3, extra-ocular muscles intact, sensation grossly intact.  HEENT: Normocephalic, atraumatic, pupils equal round reactive to light, neck supple, no masses, no lymphadenopathy, thyroid nonpalpable.  Skin: Warm and dry, no rashes. Cardiac: Regular rate and rhythm, no murmurs rubs or gallops, no lower extremity edema.  Respiratory: Clear to auscultation bilaterally. Not using accessory muscles, speaking in full sentences.  Impression and Recommendations:    Lumbar spondylosis  Response to axial and discogenic and radicular pain with epidural, good response to axial facetogenic pain with bilateral L4-L5 facet joint injections. No relief from SI joint injections or lateral branch blocks. Both pain management and [place] planning L3-S1 facet joint medial branch blocks in anticipation of radial frequency ablation, she needs to wait so she comes back from [place] for this.  I'm going to change her gabapentin to 200 mg in the morning, 1-200 mg at lunch, she will continue 600 mg at bedtime. She can use tramadol as needed for breakthrough pain.  I spent 25 minutes with this patient, greater than 50% was face-to-face time counseling regarding the above diagnoses

## 2016-09-27 ENCOUNTER — Encounter: Payer: Self-pay | Admitting: Sports Medicine

## 2016-09-27 DIAGNOSIS — M47816 Spondylosis without myelopathy or radiculopathy, lumbar region: Secondary | ICD-10-CM

## 2016-09-28 ENCOUNTER — Encounter: Payer: Self-pay | Admitting: Sports Medicine

## 2016-09-28 NOTE — Telephone Encounter (Signed)
To triage, as patient has requested please fill out letters with my name and diagnosis of "lumbar spondylosis" and I'm happy to sign them.

## 2016-09-29 NOTE — Telephone Encounter (Signed)
Injections ordered, please contact [facility] to schedule.

## 2016-10-06 ENCOUNTER — Other Ambulatory Visit: Payer: Self-pay | Admitting: Sports Medicine

## 2016-10-06 DIAGNOSIS — M7652 Patellar tendinitis, left knee: Secondary | ICD-10-CM

## 2016-10-16 ENCOUNTER — Other Ambulatory Visit: Payer: Self-pay | Admitting: Sports Medicine

## 2016-10-17 ENCOUNTER — Ambulatory Visit
Admission: RE | Admit: 2016-10-17 | Discharge: 2016-10-17 | Disposition: A | Discharge status: Home / Self Care | Payer: Federal, State, Local not specified - PPO | Source: Ambulatory Visit | Attending: Sports Medicine | Admitting: Sports Medicine

## 2016-10-17 MED ORDER — IOPAMIDOL (ISOVUE-M 200) INJECTION 41%
1.0000 mL | Freq: Once | INTRAMUSCULAR | Status: AC
  Administered 2016-10-17: 1 mL via INTRA_ARTICULAR

## 2016-10-17 MED ORDER — METHYLPREDNISOLONE ACETATE 40 MG/ML INJ SUSP (RADIOLOG
120.0000 mg | Freq: Once | INTRAMUSCULAR | Status: AC
  Administered 2016-10-17: 120 mg via INTRA_ARTICULAR

## 2016-10-17 NOTE — Discharge Instructions (Signed)

## 2016-10-23 ENCOUNTER — Other Ambulatory Visit: Payer: Federal, State, Local not specified - PPO

## 2016-10-24 ENCOUNTER — Other Ambulatory Visit: Payer: Self-pay | Admitting: Sports Medicine

## 2016-10-24 ENCOUNTER — Encounter: Payer: Self-pay | Admitting: Sports Medicine

## 2016-10-26 ENCOUNTER — Ambulatory Visit (INDEPENDENT_AMBULATORY_CARE_PROVIDER_SITE_OTHER): Payer: Federal, State, Local not specified - PPO | Admitting: Sports Medicine

## 2016-10-26 ENCOUNTER — Encounter: Payer: Self-pay | Admitting: Sports Medicine

## 2016-10-26 DIAGNOSIS — M47816 Spondylosis without myelopathy or radiculopathy, lumbar region: Secondary | ICD-10-CM

## 2016-10-26 MED ORDER — GABAPENTIN 100 MG PO CAPS: ORAL_CAPSULE | ORAL | 0 refills | Status: DC

## 2016-10-26 MED ORDER — GABAPENTIN 600 MG PO TABS: 600.0000 mg | ORAL_TABLET | Freq: Every day | ORAL | 0 refills | Status: DC

## 2016-10-26 MED ORDER — TRAMADOL HCL 50 MG PO TABS: 50.0000 mg | ORAL_TABLET | Freq: Two times a day (BID) | ORAL | 0 refills | Status: DC

## 2016-10-26 NOTE — Progress Notes (Signed)
Subjective:    CC: Back pain  HPI: This is a pleasant 66 year old female with chronic pain, we have done multiple interventions on her back including epidurals, multiple months of physical therapy, multiple medications, she has had facet joint injections with minimal relief, in fact medial branch blocks and RFA are planned by her pain doctor, we have tried SI joint injections that also provided only temporary relief, lateral branch blocks did not provide any relief so SI joint RFA was not pursued. She also has multilevel degenerative disc disease with desiccation, but no overt neuroforaminal compromise. She has a trip, [place] and is having a new one set flare in her right SI joint pain and desires repeat SI joint injection today. Pain is localized that her right SI joint with radiation into the buttock but not past the knee.  Patellar tendinitis: Still painful after PRP #3.  Past medical history:  Negative.  See flowsheet/record as well for more information.  Surgical history: Negative.  See flowsheet/record as well for more information.  Family history: Negative.  See flowsheet/record as well for more information.  Social history: Negative.  See flowsheet/record as well for more information.  Allergies, and medications have been entered into the medical record, reviewed, and no changes needed.   Review of Systems: No fevers, chills, night sweats, weight loss, chest pain, or shortness of breath.   Objective:    General: Well Developed, well nourished, and in no acute distress.  Neuro: Alert and oriented x3, extra-ocular muscles intact, sensation grossly intact.  HEENT: Normocephalic, atraumatic, pupils equal round reactive to light, neck supple, no masses, no lymphadenopathy, thyroid nonpalpable.  Skin: Warm and dry, no rashes. Cardiac: Regular rate and rhythm, no murmurs rubs or gallops, no lower extremity edema.  Respiratory: Clear to auscultation bilaterally. Not using accessory muscles,  speaking in full sentences. Back Exam:  Inspection: Unremarkable  Motion: Flexion 45 deg, Extension 45 deg, Side Bending to 45 deg bilaterally,  Rotation to 45 deg bilaterally  SLR laying: Negative  XSLR laying: Negative  Palpable tenderness: Right SI joint. FABER: negative. Sensory change: Gross sensation intact to all lumbar and sacral dermatomes.  Reflexes: 2+ at both patellar tendons, 2+ at achilles tendons, Babinski's downgoing.  Strength at foot  Plantar-flexion: 5/5 Dorsi-flexion: 5/5 Eversion: 5/5 Inversion: 5/5  Leg strength  Quad: 5/5 Hamstring: 5/5 Hip flexor: 5/5 Hip abductors: 5/5  Gait unremarkable.  Procedure: Real-time Ultrasound Guided Injection of right SI joint Device: GE Logiq E  Verbal informed consent obtained.  Time-out conducted.  Noted no overlying erythema, induration, or other signs of local infection.  Skin prepped in a sterile fashion.  Local anesthesia: Topical Ethyl chloride.  With sterile technique and under real time ultrasound guidance:  1 mL Kenalog 40, 2 mL lidocaine, 2 mL bupivacaine injected easily into the SI joint taking care to avoid the S1 foramen with a 22-gauge spinal needle. Completed without difficulty  Pain immediately resolved suggesting accurate placement of the medication.  Advised to call if fevers/chills, erythema, induration, drainage, or persistent bleeding.  Images permanently stored and available for review in the ultrasound unit.  Impression: Technically successful ultrasound guided injection.  Impression and Recommendations:    Lumbar spondylosis Hong returns, her trip to [place] is coming up and she is having some pain near the right SI joint. I injected her right SI joint today more or less as a [facility] play to try and get her some relief before her trip. I do think most of  her pain is coming from her degenerative disc disease. Pain management was planning an L3-S1 facet radiofrequency ablation after medial branch  blocks. This point I do think she should probably avoid interventional treatment from here, and we simply work further with medical pain management. She is currently taking gabapentin 200 mg morning and midday and 600 mg in the evening, and tramadol twice a day which I think is appropriate at this time.  I spent 40 minutes with this patient, greater than 50% was face-to-face time counseling regarding the above diagnoses, this was separate from the time spent performing the above procedure

## 2016-10-26 NOTE — Assessment & Plan Note (Signed)
Kathleen [name] returns, her trip to [place] is coming up and she is having some pain near the right SI joint. I injected her right SI joint today more or less as a [facility] play to try and get her some relief before her trip. I do think most of her pain is coming from her degenerative disc disease. Pain management was planning an L3-S1 facet radiofrequency ablation after medial branch blocks. This point I do think she should probably avoid interventional treatment from here, and we simply work further with medical pain management. She is currently taking gabapentin 200 mg morning and midday and 600 mg in the evening, and tramadol twice a day which I think is appropriate at this time.

## 2017-01-25 DIAGNOSIS — R768 Other specified abnormal immunological findings in serum: Secondary | ICD-10-CM | POA: Insufficient documentation

## 2017-02-09 ENCOUNTER — Ambulatory Visit: Payer: Federal, State, Local not specified - PPO | Admitting: Sports Medicine

## 2017-02-09 ENCOUNTER — Encounter: Payer: Self-pay | Admitting: Sports Medicine

## 2017-02-09 DIAGNOSIS — M47816 Spondylosis without myelopathy or radiculopathy, lumbar region: Secondary | ICD-10-CM | POA: Diagnosis not present

## 2017-02-09 MED ORDER — DULOXETINE HCL 30 MG PO CPEP: 30.0000 mg | ORAL_CAPSULE | Freq: Every day | ORAL | 3 refills | Status: DC

## 2017-02-09 MED ORDER — TRAMADOL HCL 50 MG PO TABS: 50.0000 mg | ORAL_TABLET | Freq: Three times a day (TID) | ORAL | 0 refills | Status: DC | PRN

## 2017-02-09 MED ORDER — DIAZEPAM 5 MG PO TABS
ORAL_TABLET | ORAL | 0 refills | Status: DC
Start: 2017-02-09 — End: 2017-12-06

## 2017-02-09 NOTE — Assessment & Plan Note (Signed)
He says had multiple interventions, multiple SI joint injections under ultrasound guidance have only provided minimal relief. She did proceed with dorsal ramus and lateral branch blocks which provided some temporary relief but has not proceeded with SI joint RFA. She's had bilateral L4-L5 facet joint injections that have provided fantastic relief of her axial facetogenic type pain. She also had an L4-L5 interlaminar epidural on the right that provided good relief of her axial discogenic-type pain, she really doesn't have anything overtly radicular. We have not yet proceeded with facet medial branch blocks in preparation for radiofrequency ablation. In the meantime she's been doing physical therapy, we have done gabapentin at a dose that she can tolerate which has provided some relief, initially she was doing tramadol twice a day, I'm going to increase this to 3 times per day. I'm also going to add Cymbalta 30 mg, she does have some myofascial type pain symptoms with hyperalgesia., Return to see me in one week to discuss this. I would like a second opinion from Dr. Claudette LawsAndrew Kirsteins. I'm also going to try and get her a new MRI in anticipation of further interventions, but this time [facility] or [facility], she will need Valium for preprocedural anxiolysis.

## 2017-02-09 NOTE — Progress Notes (Signed)
  Subjective:    CC: Low back pain  HPI: Kathleen [name] has returned from [place], she had a good trip. Her pain was moderately well controlled with tramadol use twice a day, gabapentin. She's had multiple interventions, she's had multiple episodes of temporary and marginal pain relief but has not yet had any intervention with long-lasting pain relief. It is moderate, persistent, localized in the midline of the low back, she has both discogenic and facetogenic components without radiculitis. No bowel or bladder dysfunction, saddle numbness, constitutional symptoms. She would like a referral to a different pain provider more for proximity purposes.  Past medical history:  Negative.  See flowsheet/record as well for more information.  Surgical history: Negative.  See flowsheet/record as well for more information.  Family history: Negative.  See flowsheet/record as well for more information.  Social history: Negative.  See flowsheet/record as well for more information.  Allergies, and medications have been entered into the medical record, reviewed, and no changes needed.   (To billers/coders, pertinent past medical, social, surgical, family history can be found in problem list, if problem list is marked as reviewed then this indicates that past medical, social, surgical, family history was also reviewed)  Review of Systems: No fevers, chills, night sweats, weight loss, chest pain, or shortness of breath.   Objective:    General: Well Developed, well nourished, and in no acute distress.  Neuro: Alert and oriented x3, extra-ocular muscles intact, sensation grossly intact.  HEENT: Normocephalic, atraumatic, pupils equal round reactive to light, neck supple, no masses, no lymphadenopathy, thyroid nonpalpable.  Skin: Warm and dry, no rashes. Cardiac: Regular rate and rhythm, no murmurs rubs or gallops, no lower extremity edema.  Respiratory: Clear to auscultation bilaterally. Not using accessory muscles,  speaking in full sentences.  Impression and Recommendations:    Multifactorial low back pain He says had multiple interventions, multiple SI joint injections under ultrasound guidance have only provided minimal relief. She did proceed with dorsal ramus and lateral branch blocks which provided some temporary relief but has not proceeded with SI joint RFA. She's had bilateral L4-L5 facet joint injections that have provided fantastic relief of her axial facetogenic type pain. She also had an L4-L5 interlaminar epidural on the right that provided good relief of her axial discogenic-type pain, she really doesn't have anything overtly radicular. We have not yet proceeded with facet medial branch blocks in preparation for radiofrequency ablation. In the meantime she's been doing physical therapy, we have done gabapentin at a dose that she can tolerate which has provided some relief, initially she was doing tramadol twice a day, I'm going to increase this to 3 times per day. I'm also going to add Cymbalta 30 mg, she does have some myofascial type pain symptoms with hyperalgesia., Return to see me in one week to discuss this. I would like a second opinion from Kathleen [name]. I'm also going to try and get her a new MRI in anticipation of further interventions, but this time in one of 3T units at [name], she will need Valium for preprocedural anxiolysis.  I spent 25 minutes with this patient, greater than 50% was face-to-face time counseling regarding the above diagnoses ___________________________________________ Kathleen [name], M.D., ABFM., CAQSM. Primary Care and Sports Medicine [facility]  Adjunct Instructor of Family Medicine  [employer]

## 2017-02-12 ENCOUNTER — Other Ambulatory Visit: Payer: Self-pay | Admitting: Sports Medicine

## 2017-02-21 ENCOUNTER — Ambulatory Visit
Admission: RE | Admit: 2017-02-21 | Discharge: 2017-02-21 | Disposition: A | Discharge status: Home / Self Care | Payer: Federal, State, Local not specified - PPO | Source: Ambulatory Visit | Attending: Sports Medicine | Admitting: Sports Medicine

## 2017-02-21 DIAGNOSIS — M47816 Spondylosis without myelopathy or radiculopathy, lumbar region: Secondary | ICD-10-CM

## 2017-02-23 NOTE — Progress Notes (Signed)
Good morning Rubin Payordith,  Your MRI shows a mild bulging disc in the lower lumbar spine at L5-S1 and some arthritis. Recommend maintain follow-up with Dr. Karie Schwalbe and discuss management with him. He will return on January 7th.  Best, Vinetta Bergamoharley

## 2017-03-12 ENCOUNTER — Ambulatory Visit: Payer: Federal, State, Local not specified - PPO | Admitting: Sports Medicine

## 2017-03-15 ENCOUNTER — Ambulatory Visit: Payer: Federal, State, Local not specified - PPO | Admitting: Sports Medicine

## 2017-03-15 ENCOUNTER — Encounter: Payer: Self-pay | Admitting: Sports Medicine

## 2017-03-15 DIAGNOSIS — M47816 Spondylosis without myelopathy or radiculopathy, lumbar region: Secondary | ICD-10-CM

## 2017-03-15 NOTE — Assessment & Plan Note (Signed)
Kathleen [name] has had multiple interventions, multiple SI joint injections under ultrasound guidance have only provided minimal relief. She did proceed with dorsal ramus and lateral branch blocks which provided some temporary relief but has not proceeded with SI joint RFA. She's had bilateral L4-L5 facet joint injections that have provided fantastic relief of her axial facetogenic type pain. She also had an L4-L5 interlaminar epidural on the right that provided good relief of her axial discogenic-type pain, she really doesn't have anything overtly radicular. We have not yet proceeded with facet medial branch blocks in preparation for radiofrequency ablation. In the meantime she's been doing physical therapy, we have done gabapentin at a dose that she can tolerate which has provided some relief, initially she was doing tramadol twice a day, I'm going to increase this to 3 times per day. I added Cymbalta 30 mg at the last visit because she does have some allodynia and myofascial type symptoms. She has not yet had a second opinion from Dr. Claudette LawsAndrew Kirsteins. We did get a new MRI and [facility], I could see the L4-L5 facet arthritis more clearly.  I do think this is her principal pain generator. Before we proceed with any further interventions I would like the opinion from interventional/medical pain management.

## 2017-03-15 NOTE — Progress Notes (Signed)
Subjective:    CC: Low back pain  HPI: Kathleen [name] returns, see previous HPIs  and assessment and plan for details, at the last visit we added Cymbalta, she was having some allodynia and myofascial type symptoms.  She really feels as though her symptoms have not improved that much.  She also had a new MRI and a 3 Tesla unit the results of which will be dictated below.  She has not yet heard back from Dr. Wynn Banker.  I reviewed the past medical history, family history, social history, surgical history, and allergies today and no changes were needed.  Please see the problem list section below in epic for further details.  Past Medical History: No past medical history on file. Past Surgical History: History reviewed. No pertinent surgical history. Social History: Social History   Socioeconomic History  . Marital status: Married    Spouse name: None  . Number of children: None  . Years of education: None  . Highest education level: None  Social Needs  . Financial resource strain: None  . Food insecurity - worry: None  . Food insecurity - inability: None  . Transportation needs - medical: None  . Transportation needs - non-medical: None  Occupational History  . None  Tobacco Use  . Smoking status: Never Smoker  . Smokeless tobacco: Never Used  Substance and Sexual Activity  . Alcohol use: None  . Drug use: None  . Sexual activity: None  Other Topics Concern  . None  Social History Narrative  . None   Family History: No family history on file. Allergies: Allergies  Allergen Reactions  . Clindamycin/Lincomycin Nausea Only   Medications: See med rec.  Review of Systems: No fevers, chills, night sweats, weight loss, chest pain, or shortness of breath.   Objective:    General: Well Developed, well nourished, and in no acute distress.  Neuro: Alert and oriented x3, extra-ocular muscles intact, sensation grossly intact.  HEENT: Normocephalic, atraumatic, pupils equal round  reactive to light, neck supple, no masses, no lymphadenopathy, thyroid nonpalpable.  Skin: Warm and dry, no rashes. Cardiac: Regular rate and rhythm, no murmurs rubs or gallops, no lower extremity edema.  Respiratory: Clear to auscultation bilaterally. Not using accessory muscles, speaking in full sentences.  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  Impression and Recommendations:    Multifactorial low back pain Kathleen [name] has had multiple interventions, multiple SI joint injections under ultrasound guidance have only provided minimal relief. She did proceed with dorsal ramus and lateral branch blocks which provided some temporary relief but has not proceeded with SI joint RFA. She's had bilateral L4-L5 facet joint injections that have provided fantastic relief of her axial facetogenic type pain. She also had an L4-L5 interlaminar epidural on the right that provided good relief of her axial discogenic-type pain, she really doesn't have anything overtly radicular. We have not yet proceeded with facet medial branch blocks in preparation for radiofrequency ablation. In the meantime she's been doing physical therapy, we have done gabapentin at a dose that she can tolerate which has provided some relief, initially she was doing tramadol twice a day, I'm going to increase this to 3 times per day. I added Cymbalta 30 mg at the last visit because she does have some allodynia and myofascial type symptoms. She has not yet had a second opinion from Dr. Claudette Laws. We did get a new MRI and a  3 Tesla [facility], I could see the L4-L5 facet arthritis more clearly.  I do think this is her principal pain generator. Before we proceed with any further interventions I would like the opinion from interventional/medical pain management.  I spent 25 minutes with this patient, greater than 50% was  face-to-face time counseling regarding the above diagnoses ___________________________________________ Ihor Austinhomas J. Benjamin Stainhekkekandam, M.D., ABFM., CAQSM. Primary Care and Sports Medicine [facility]  Adjunct Instructor of Family Medicine  [employer]

## 2017-03-27 ENCOUNTER — Ambulatory Visit: Payer: Federal, State, Local not specified - PPO | Admitting: Physical Medicine & Rehabilitation

## 2017-03-31 ENCOUNTER — Other Ambulatory Visit: Payer: Self-pay | Admitting: Sports Medicine

## 2017-03-31 DIAGNOSIS — M47816 Spondylosis without myelopathy or radiculopathy, lumbar region: Secondary | ICD-10-CM

## 2017-04-02 ENCOUNTER — Encounter: Payer: Federal, State, Local not specified - PPO | Attending: Physical Medicine & Rehabilitation

## 2017-04-02 ENCOUNTER — Ambulatory Visit: Payer: Federal, State, Local not specified - PPO | Admitting: Physical Medicine & Rehabilitation

## 2017-04-02 ENCOUNTER — Encounter: Payer: Self-pay | Admitting: Physical Medicine & Rehabilitation

## 2017-04-02 VITALS — BP 127/77 | HR 68 | Resp 14

## 2017-04-02 DIAGNOSIS — G8929 Other chronic pain: Secondary | ICD-10-CM | POA: Diagnosis not present

## 2017-04-02 DIAGNOSIS — M47816 Spondylosis without myelopathy or radiculopathy, lumbar region: Secondary | ICD-10-CM | POA: Diagnosis not present

## 2017-04-02 DIAGNOSIS — M7918 Myalgia, other site: Secondary | ICD-10-CM | POA: Diagnosis not present

## 2017-04-02 NOTE — Progress Notes (Signed)
Subjective:    Patient ID: Kathleen [name], female    DOB: 04/17/1950, 67 y.o.   MRN: [id number] Consult requested by Dr. Karie Schwalbe for evaluation of interventional pain management options for patient's chronic low back pain HPI  67 year old female with chief complaint of chronic low back pain and is mainly right-sided.  Patient complains of both gluteus pain as well as low back pain.  She cannot tell me which one is worse although currently the gluteus pain seems to be more problematic.  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 [facility]. She has undergone physical therapy and has an ongoing treatments with a physical therapist She also works out at a gym.  She has been started on Cymbalta 30 mg/day for symptoms of allodynia and myofascial pain. She has an MRI of the lumbar spine EXAM: MRI LUMBAR SPINE WITHOUT CONTRAST  TECHNIQUE: Multiplanar, multisequence MR imaging of the lumbar spine was performed. No intravenous contrast was administered.  COMPARISON:  01/30/2015  FINDINGS: Segmentation:  Standard.  Alignment:  2 mm anterolisthesis of L4 on L5 and L5 on S1.  Vertebrae: No fracture, evidence of discitis, or bone lesion. Bilateral L5 pars interarticularis defect.  Conus medullaris and cauda equina: Conus extends to the T12 level. Conus and cauda equina appear normal.  Paraspinal and other soft tissues: No paraspinal abnormality.  Disc levels:  Disc spaces: Disc desiccation throughout the lumbar spine. Disc heights are maintained.  T12-L1: No significant disc bulge. No evidence of neural foraminal stenosis. No central canal stenosis.  L1-L2: No significant disc bulge. No evidence of neural foraminal stenosis. No central canal stenosis.  L2-L3: Minimal broad-based disc bulge. Mild bilateral facet arthropathy. No evidence of  neural foraminal stenosis. No central canal stenosis.  L3-L4: Mild broad-based disc bulge. Mild bilateral facet arthropathy. No evidence of neural foraminal stenosis. No central canal stenosis.  L4-L5: Mild broad-based disc bulge with a central disc protrusion. Bilateral lateral recess stenosis. Moderate bilateral facet arthropathy. No evidence of neural foraminal stenosis. No central canal stenosis.  L5-S1: Broad-based disc bulge. Bilateral mild foraminal narrowing. No central canal stenosis.  IMPRESSION: 1. At L4-5 there is a mild broad-based disc bulge with a central disc protrusion. Bilateral lateral recess stenosis. Moderate bilateral facet arthropathy. 2. No acute injury of the lumbar spine.   Electronically Signed   By: Elige KoHetal  Patel   On: 02/21/2017 14:22  I reviewed this MRI as well and have also noted some lumbar facet arthropathy at L5-S1.  She is undergone ultrasound-guided sacroiliac injections without significant relief. Underwent L4-5 translaminar injection on 06/22/2015 with resolution of radicular symptoms in the right lower limb.  Lumbar intra-articular facet injections were performed bilaterally on 09/06/2015, 12/10/2015, 07/20/2016, [facility] these injections lasted a couple months. [facility] note 12/11/2016 makes reference to trials of L5 dorsal ramus S1-S2 lateral branch blocks without consistent improvement of her pain. Do not see any documentation of L3-L4 medial branch and L5 dorsal ramus injections under fluoroscopic guidance. Pain Inventory Average Pain 6 Pain Right Now 5 My pain is sharp, dull and stabbing  In the last 24 hours, has pain interfered with the following? General activity 4 Relation with others 5 Enjoyment of life 5 What TIME of day is your pain at its worst? evening Sleep (in general) Good  Pain is worse with: walking, bending, sitting and  inactivity Pain improves with: rest, heat/ice, pacing  activities and medication Relief from Meds: 5  Mobility walk without assistance how many minutes can you walk? 30-45 ability to climb steps?  yes do you drive?  yes  Function not employed: date last employed 2007 I need assistance with the following:  household duties Do you have any goals in this area?  yes  Neuro/Psych weakness tingling  Prior Studies new visit  Physicians involved in your care new visit   History reviewed. No pertinent family history. Social History   Socioeconomic History  . Marital status: Married    Spouse name: None  . Number of children: None  . Years of education: None  . Highest education level: None  Social Needs  . Financial resource strain: None  . Food insecurity - worry: None  . Food insecurity - inability: None  . Transportation needs - medical: None  . Transportation needs - non-medical: None  Occupational History  . None  Tobacco Use  . Smoking status: Never Smoker  . Smokeless tobacco: Never Used  Substance and Sexual Activity  . Alcohol use: None  . Drug use: None  . Sexual activity: None  Other Topics Concern  . None  Social History Narrative  . None   History reviewed. No pertinent surgical history. History reviewed. No pertinent past medical history. BP 127/77 (BP Location: Left Arm, Patient Position: Sitting, Cuff Size: Normal)   Pulse 68   Resp 14   SpO2 98%   Opioid Risk Score:   Fall Risk Score:  `1  Depression screen PHQ 2/9  Depression screen [facility] 2/9 04/02/2017 10/26/2016  Decreased Interest 0 0  Down, Depressed, Hopeless 0 0  PHQ - 2 Score 0 0    Review of Systems  HENT: Negative.   Eyes: Negative.   Respiratory: Negative.   Cardiovascular: Negative.   Gastrointestinal: Negative.   Endocrine: Negative.   Genitourinary: Negative.   Musculoskeletal: Positive for arthralgias and back pain.  Skin: Negative.   Allergic/Immunologic: Negative.   Neurological: Positive for weakness.       Tingling    Hematological: Negative.   Psychiatric/Behavioral: Negative.   All other systems reviewed and are negative.      Objective:   Physical Exam  Constitutional: She is oriented to person, place, and time. She appears well-developed and well-nourished. No distress.  HENT:  Head: Normocephalic and atraumatic.  Eyes: Conjunctivae and EOM are normal. Pupils are equal, round, and reactive to light.  Neck: Normal range of motion.  Cardiovascular: Normal rate, regular rhythm and normal heart sounds. Exam reveals no friction rub.  No murmur heard. Pulmonary/Chest: Effort normal and breath sounds normal. No respiratory distress. She has no wheezes.  Abdominal: Soft. Bowel sounds are normal. She exhibits no distension. There is no tenderness.  Musculoskeletal:  Lumbar spine has normal range of motion flexion extension lateral bending and rotation.  Patient has more pain with extension than she does with flexion Negative straight leg raising Tenderness palpation over the gluteus medius area bilaterally but not over the gluteus maximus area.  Minimal tenderness over the greater trochanter of the hip   Neurological: She is alert and oriented to person, place, and time. She has normal strength. She displays no tremor. She exhibits normal muscle tone. Coordination normal.  Reflex Scores:      Tricep reflexes are 2+ on the right side and 2+ on the left side.      Bicep reflexes are 2+ on the right  side and 2+ on the left side.      Brachioradialis reflexes are 2+ on the right side and 2+ on the left side.      Patellar reflexes are 2+ on the right side and 2+ on the left side.      Achilles reflexes are 2+ on the right side and 2+ on the left side. 5/5 strength bilateral deltoid, bicep, tricep, grip, hip flexor, knee extensor, ankle dorsiflexor Patient is able to Toe Walk and Heel Walk.  Skin: She is not diaphoretic.  Psychiatric: She has a normal mood and affect. Her behavior is normal. Thought  content normal.  Nursing note and vitals reviewed.         Assessment & Plan:  1.  Chronic low back pain multifactorial, her buttocks pain is most likely myofascial, she has been appropriately doing physical therapy, she may benefit from  Acupuncture.  In regards to her lumbar pain, it is mostly at the L4-5 levels worse on the right side. Would recommend right L3-L4 medial branch and right L5 dorsal ramus injections under fluoroscopic guidance.  If she has 50% relief on 2 occasions then would recommend radiofrequency at the same levels. Discussed this with the patient and her husband.  They will think about this.   We discussed that intra-articular facet injections are not used very frequently anymore because of the short duration of response.

## 2017-04-02 NOTE — Patient Instructions (Signed)
Recommend Acupuncture for gluteus pain if insurance pays, I can do If not rec [facility]  Please call to schedule Lumbar medial branch block L3-4 , L5 dorsal ramus

## 2017-04-13 ENCOUNTER — Encounter: Payer: Self-pay | Admitting: Physical Medicine & Rehabilitation

## 2017-04-13 ENCOUNTER — Ambulatory Visit: Payer: Federal, State, Local not specified - PPO | Admitting: Physical Medicine & Rehabilitation

## 2017-04-13 VITALS — BP 136/76 | HR 73

## 2017-04-13 DIAGNOSIS — M47816 Spondylosis without myelopathy or radiculopathy, lumbar region: Secondary | ICD-10-CM

## 2017-04-13 MED ORDER — DICLOFENAC SODIUM 1 % TD GEL: 1.0000 "application " | Freq: Four times a day (QID) | TRANSDERMAL | 3 refills | Status: DC

## 2017-04-13 NOTE — Progress Notes (Signed)
Subjective:    Patient ID: Kathleen [name], female    DOB: [birth year], 67 y.o.   MRN: [id number]  HPI 67 year old female with history of chronic right-sided low back pain.  She does have some increasing pain on the left side as well.  She was seen in initial consultation approximately 2 weeks ago.  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  She has had prior lumbar facet intra-articular injections which were helpful at that level.  These were done bilaterally. She has had no significant improvements after sacroiliac injections under fluoroscopic guidance. She has had no new changes in her symptomatology she has specific questions about medial branch blocks as well as radiofrequency ablation. Pain Inventory Average Pain 5 Pain Right Now 6 My pain is sharp, dull and aching  In the last 24 hours, has pain interfered with the following? General activity 5 Relation with others 4 Enjoyment of life 5 What TIME of day is your pain at its worst? evening Sleep (in general) Fair  Pain is worse with: bending, inactivity and some activites Pain improves with: heat/ice Relief from Meds: 5  Mobility ability to climb steps?  yes do you drive?  yes  Function retired  Neuro/Psych No problems in this area  Prior Studies Any changes since last visit?  no  Physicians involved in your care Any changes since last visit?  no   No family history on file. Social History   Socioeconomic History  . Marital status: Married    Spouse name: Not on file  . Number of children: Not on file  . Years of education: Not on file  . Highest education level: Not on file  Social Needs  . Financial resource strain: Not on file  . Food insecurity - worry: Not on file  . Food insecurity - inability: Not on file  . Transportation needs - medical: Not on file  . Transportation needs - non-medical: Not on file  Occupational History  . Not on file  Tobacco Use  .  Smoking status: Never Smoker  . Smokeless tobacco: Never Used  Substance and Sexual Activity  . Alcohol use: Not on file  . Drug use: Not on file  . Sexual activity: Not on file  Other Topics Concern  . Not on file  Social History Narrative  . Not on file   No past surgical history on file. No past medical history on file. There were no vitals taken for this visit.  Opioid Risk Score:   Fall Risk Score:  `1  Depression screen PHQ 2/9  Depression screen [facility] 2/9 04/02/2017 10/26/2016  Decreased Interest 0 0  Down, Depressed, Hopeless 0 0  PHQ - 2 Score 0 0     Review of Systems  Constitutional: Negative.   HENT: Negative.   Eyes: Negative.   Respiratory: Negative.   Cardiovascular: Negative.   Gastrointestinal: Negative.   Endocrine: Negative.   Genitourinary: Negative.   Musculoskeletal: Negative.   Skin: Negative.   Allergic/Immunologic: Negative.   Neurological: Negative.   Hematological: Negative.   Psychiatric/Behavioral: Negative.   All other systems reviewed and are negative.      Objective:   Physical Exam  Constitutional: She is oriented to person, place, and time. She appears well-developed and well-nourished. No distress.  HENT:  Head: Normocephalic and atraumatic.  Eyes: Conjunctivae and EOM are normal. Pupils are equal, round, and reactive to light.  Musculoskeletal:  Mild tenderness palpation in the right gluteus  medius area.  Lumbar range of motion is full with flexion and and extension however extension is accompanied by pain in the lower lumbar area mostly toward the right side.  Neurological: She is alert and oriented to person, place, and time.  Gait is normal Lower extremity strength is normal  Skin: She is not diaphoretic.  Psychiatric: She has a normal mood and affect. Her behavior is normal. Judgment and thought content normal.  Nursing note and vitals reviewed.         Assessment & Plan:  1.  Lumbar spondylosis without myelopathy.   She has had good relief short-term with lumbar intra-articular facet injections at L4-L5.  Recommend lumbar medial branch blocks to block both the L4-5 as well as L5-S1 1 level given her gluteal radiation.  She has had bilateral pain right greater than left therefore will do bilateral injections.  We discussed that threshold for second medial branch block would be at least a 50% relief on the first set of injections. We discussed using diazepam prior to the procedure she stated that she thinks she may have an extra tablet at home from a MRI.  If not she will call us. We discussed the need for a driver.  We discussed timing of injections as well as the fact that radiofrequency ablation is done unilaterally and 1 month apart. We will schedule bilateral L3-L4 medial branch and L5 dorsal ramus injection under fluoroscopic guidance in approximately 2 weeks

## 2017-04-13 NOTE — Patient Instructions (Signed)
Test blocks next visit looking for at least 50% relief

## 2017-04-25 ENCOUNTER — Telehealth: Payer: Self-pay | Admitting: Physical Medicine & Rehabilitation

## 2017-04-25 NOTE — Telephone Encounter (Signed)
Patient is due to have an injection with Dr. Wynn BankerKirsteins on 04/27/17, and right now patient is feeling better and may not need the injection.  Patient will come into visit and speak with him first.  Told patient we would leave it on for the injection just in case she does need it.

## 2017-04-27 ENCOUNTER — Encounter: Payer: Federal, State, Local not specified - PPO | Attending: Physical Medicine & Rehabilitation

## 2017-04-27 ENCOUNTER — Ambulatory Visit: Payer: Federal, State, Local not specified - PPO | Admitting: Physical Medicine & Rehabilitation

## 2017-04-27 ENCOUNTER — Encounter: Payer: Self-pay | Admitting: Physical Medicine & Rehabilitation

## 2017-04-27 VITALS — BP 121/74 | HR 68

## 2017-04-27 DIAGNOSIS — M47816 Spondylosis without myelopathy or radiculopathy, lumbar region: Secondary | ICD-10-CM | POA: Insufficient documentation

## 2017-04-27 DIAGNOSIS — G8929 Other chronic pain: Secondary | ICD-10-CM | POA: Diagnosis present

## 2017-04-27 DIAGNOSIS — M7918 Myalgia, other site: Secondary | ICD-10-CM | POA: Diagnosis present

## 2017-04-27 NOTE — Addendum Note (Signed)
Addended by: Erick ColaceKIRSTEINS, Shritha Bresee E on: 04/27/2017 03:54 PM   Modules accepted: Level of Service

## 2017-04-27 NOTE — Progress Notes (Signed)
Subjective:    Patient ID: Kathleen [name], female    DOB: Mar 10, 1950, 67 y.o.   MRN: [id number]  HPI 67 year old female with history of chronic right-sided low back pain.  MRI shows no nerve root compression or spinal stenosis.  She does have lumbar spondylosis.  Her pain had been averaging 5-6/10 but over the last week has been averaging about 3/10 Patient was originally scheduled for lumbar medial branch blocks but would like to discuss this given her improvement in pain.  She has not changed anything in terms of her workout no medication changes. Pain Inventory Average Pain 5 Pain Right Now 3 My pain is constant, sharp, dull and aching  In the last 24 hours, has pain interfered with the following? General activity 2 Relation with others 0 Enjoyment of life 2 What TIME of day is your pain at its worst? evening Sleep (in general) Good  Pain is worse with: walking and sitting Pain improves with: heat/ice, therapy/exercise and medication Relief from Meds: 5  Mobility walk without assistance ability to climb steps?  yes do you drive?  yes  Function retired  Neuro/Psych No problems in this area  Prior Studies Any changes since last visit?  no  Physicians involved in your care Any changes since last visit?  no   No family history on file. Social History   Socioeconomic History  . Marital status: Married    Spouse name: None  . Number of children: None  . Years of education: None  . Highest education level: None  Social Needs  . Financial resource strain: None  . Food insecurity - worry: None  . Food insecurity - inability: None  . Transportation needs - medical: None  . Transportation needs - non-medical: None  Occupational History  . None  Tobacco Use  . Smoking status: Never Smoker  . Smokeless tobacco: Never Used  Substance and Sexual Activity  . Alcohol use: None  . Drug use: None  . Sexual activity: None  Other Topics Concern  . None  Social  History Narrative  . None   No past surgical history on file. No past medical history on file. BP 121/74   Pulse 68   SpO2 98%   Opioid Risk Score:   Fall Risk Score:  `1  Depression screen PHQ 2/9  Depression screen [facility] 2/9 04/02/2017 10/26/2016  Decreased Interest 0 0  Down, Depressed, Hopeless 0 0  PHQ - 2 Score 0 0     Review of Systems  Constitutional: Negative.   HENT: Negative.   Eyes: Negative.   Respiratory: Negative.   Cardiovascular: Negative.   Gastrointestinal: Negative.   Endocrine: Negative.   Genitourinary: Negative.   Musculoskeletal: Negative.   Skin: Negative.   Allergic/Immunologic: Negative.   Neurological: Negative.   Hematological: Negative.   Psychiatric/Behavioral: Negative.   All other systems reviewed and are negative.      Objective:   Physical Exam  Constitutional: She is oriented to person, place, and time. She appears well-developed and well-nourished. No distress.  HENT:  Head: Normocephalic and atraumatic.  Eyes: Conjunctivae and EOM are normal. Pupils are equal, round, and reactive to light.  Neck: Normal range of motion.  Musculoskeletal:  No tenderness palpation in the lumbar paraspinals. Negative straight leg raising She does have pain with lumbar extension on the right side of her low back.  She has pain on the left side with left lateral bending but not on the right side. She has no  pain with hip internal/external rotation No pain with knee range of motion.  Neurological: She is alert and oriented to person, place, and time. Gait normal.  Reflex Scores:      Patellar reflexes are 2+ on the right side and 2+ on the left side.      Achilles reflexes are 2+ on the right side and 2+ on the left side. Motor strength is 5/5 bilateral hip flexor knee extensor ankle dorsiflexor.  Skin: She is not diaphoretic.  Psychiatric: She has a normal mood and affect.  Nursing note and vitals reviewed.         Assessment & Plan:  1.   Lumbar spondylosis without evidence of myelopathy or radiculopathy.  Overall she is doing better over the last week.  She is taking a trip in April and would like to have procedure done prior to that time.  We discussed that with a mild level of pain, it is difficult to assess the efficacy of the injection.  Therefore we will reschedule in 2 weeks.  If her pain levels maintain at a 3-4/10 or below, I would recommend waiting until pain levels are consistently in the moderate range at least.

## 2017-04-27 NOTE — Patient Instructions (Signed)
Please call to cancel if pain in low back is mild

## 2017-05-14 ENCOUNTER — Ambulatory Visit: Payer: Federal, State, Local not specified - PPO

## 2017-05-14 ENCOUNTER — Ambulatory Visit: Payer: Federal, State, Local not specified - PPO | Admitting: Physical Medicine & Rehabilitation

## 2017-05-19 ENCOUNTER — Other Ambulatory Visit: Payer: Self-pay | Admitting: Sports Medicine

## 2017-05-29 ENCOUNTER — Ambulatory Visit (HOSPITAL_BASED_OUTPATIENT_CLINIC_OR_DEPARTMENT_OTHER): Payer: Federal, State, Local not specified - PPO | Admitting: Physical Medicine & Rehabilitation

## 2017-05-29 ENCOUNTER — Encounter: Payer: Self-pay | Admitting: Physical Medicine & Rehabilitation

## 2017-05-29 ENCOUNTER — Encounter: Payer: Federal, State, Local not specified - PPO | Attending: Physical Medicine & Rehabilitation

## 2017-05-29 VITALS — BP 113/73 | HR 62 | Resp 14 | Ht 63.0 in | Wt 108.0 lb

## 2017-05-29 DIAGNOSIS — M7918 Myalgia, other site: Secondary | ICD-10-CM | POA: Insufficient documentation

## 2017-05-29 DIAGNOSIS — G8929 Other chronic pain: Secondary | ICD-10-CM | POA: Insufficient documentation

## 2017-05-29 DIAGNOSIS — M533 Sacrococcygeal disorders, not elsewhere classified: Secondary | ICD-10-CM | POA: Diagnosis not present

## 2017-05-29 DIAGNOSIS — M47816 Spondylosis without myelopathy or radiculopathy, lumbar region: Secondary | ICD-10-CM | POA: Insufficient documentation

## 2017-05-29 NOTE — Patient Instructions (Signed)
Lateral planks to target quadratus lumborum Also do lat pulldowns 3 x per week

## 2017-05-29 NOTE — Progress Notes (Signed)
Subjective:    Patient ID: Kathleen [name], female    DOB: [birth year], 67 y.o.   MRN: [id number]  HPI 67 year old female with history of chronic right-sided low back pain.  MRI shows no nerve root compression or spinal stenosis.  She does have lumbar spondylosis. Patient states she has migratory symptoms.  She has pain in the right buttocks area.  Her therapist tells her that is over the SI joint.  She has pain in her lumbar spine as well but this is not consistent on a daily basis.  She occasionally has left buttock pain as well as hip pain. She has no pain traveling down her legs to her feet at the current time.  She has bilateral knee pain worse on the left side with history of osteoarthritis diagnosed by MRI 01/30/2015  She is planning a trip which will involve quite a bit of sitting.  Her right buttocks pain does increase with sitting  Pain Inventory Average Pain 5 Pain Right Now 3 My pain is intermittent, sharp and dull  In the last 24 hours, has pain interfered with the following? General activity 6 Relation with others 6 Enjoyment of life 5 What TIME of day is your pain at its worst? evening Sleep (in general) Good  Pain is worse with: sitting and some activites Pain improves with: rest, heat/ice, therapy/exercise and medication Relief from Meds: 6  Mobility walk without assistance how many minutes can you walk? 60 ability to climb steps?  yes do you drive?  yes Do you have any goals in this area?  yes  Function not employed: date last employed .  Neuro/Psych No problems in this area  Prior Studies Any changes since last visit?  no  Physicians involved in your care Any changes since last visit?  no   History reviewed. No pertinent family history. Social History   Socioeconomic History  . Marital status: Married    Spouse name: Not on file  . Number of children: Not on file  . Years of education: Not on file  . Highest education level: Not on file    Occupational History  . Not on file  Social Needs  . Financial resource strain: Not on file  . Food insecurity:    Worry: Not on file    Inability: Not on file  . Transportation needs:    Medical: Not on file    Non-medical: Not on file  Tobacco Use  . Smoking status: Never Smoker  . Smokeless tobacco: Never Used  Substance and Sexual Activity  . Alcohol use: Not on file  . Drug use: Not on file  . Sexual activity: Not on file  Lifestyle  . Physical activity:    Days per week: Not on file    Minutes per session: Not on file  . Stress: Not on file  Relationships  . Social connections:    Talks on phone: Not on file    Gets together: Not on file    Attends religious service: Not on file    Active member of club or organization: Not on file    Attends meetings of clubs or organizations: Not on file    Relationship status: Not on file  Other Topics Concern  . Not on file  Social History Narrative  . Not on file   History reviewed. No pertinent surgical history. History reviewed. No pertinent past medical history. BP 113/73   Pulse 62   Resp 14   Ht 5'  3" (1.6 m)   Wt 108 lb (49 kg)   SpO2 98%   BMI 19.13 kg/m   Opioid Risk Score:   Fall Risk Score:  `1  Depression screen PHQ 2/9  [facility] 04/02/2017 10/26/2016  Decreased Interest 0 0  Down, Depressed, Hopeless 0 0  PHQ - 2 Score 0 0    Review of Systems  Constitutional: Negative.   HENT: Negative.   Eyes: Negative.   Respiratory: Negative.   Cardiovascular: Negative.   Gastrointestinal: Negative.   Endocrine: Negative.   Genitourinary: Negative.   Musculoskeletal: Positive for arthralgias and back pain.  Skin: Negative.   Allergic/Immunologic: Negative.   Neurological: Negative.   Hematological: Negative.   Psychiatric/Behavioral: Negative.   All other systems reviewed and are negative.      Objective:   Physical Exam  Constitutional: She is oriented to person, place, and time.  She appears well-developed and well-nourished. No distress.  HENT:  Head: Normocephalic and atraumatic.  Eyes: Pupils are equal, round, and reactive to light. Conjunctivae and EOM are normal.  Neck: Normal range of motion.  Musculoskeletal:  Patient has reduced hip external rotation bilaterally.  Internal rotation is mildly limited.  Positive Faber's on the right side producing pain in the right sacroiliac area. There is mild tenderness to palpation right PSIS area.  Also tenderness over the left gluteus maximus and gluteus medius area there is mild bilateral tenderness over the greater trochanters of the hip. No pain with knee range of motion bilaterally  Neurological: She is alert and oriented to person, place, and time.  Motor strength is 5/5 bilateral hip flexor knee extensor ankle dorsiflexor bilaterally Gait is without evidence of toe drag or knee instability.  No antalgia noted.  Skin: She is not diaphoretic.  Nursing note and vitals reviewed.         Assessment & Plan:  1.  Chronic low back buttock and hip pain.  Symptoms do fluctuate.  She has several pain generators.  Today she has evidence of myofascial pain in the gluteal musculature. 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 In addition she likely has mild trochanteric bursitis or "gluteus medius syndrome" bilaterally. We reviewed her exercise program.  Recommended the addition of latissimus dorsi exercises as additional stabilizator, she will go to the gym 3 times a week to work on this Also recommend sideplates to work on quadratus lumborum In terms of her sacroiliac dysfunction will set her up for right sacroiliac injection to help in the short-term.  Patient has had sacroiliac injection without fluoroscopic guidance in the past which was of equivocal benefit.  We discussed that it is difficult to actually enter the SI jt without fluoroscopic guidance.   Over half of the 25 min visit was  spent counseling and coordinating care.  2.  Left knee osteoarthritis using knee orthosis.  Quad strengthening recommended No evidence of effusion, no recommendation for knee injection at the current time

## 2017-06-12 ENCOUNTER — Encounter: Payer: Self-pay | Admitting: Physical Medicine & Rehabilitation

## 2017-06-12 ENCOUNTER — Ambulatory Visit (HOSPITAL_BASED_OUTPATIENT_CLINIC_OR_DEPARTMENT_OTHER): Payer: Federal, State, Local not specified - PPO | Admitting: Physical Medicine & Rehabilitation

## 2017-06-12 VITALS — BP 125/73 | HR 66 | Ht 64.0 in | Wt 108.0 lb

## 2017-06-12 DIAGNOSIS — M47816 Spondylosis without myelopathy or radiculopathy, lumbar region: Secondary | ICD-10-CM | POA: Diagnosis not present

## 2017-06-12 DIAGNOSIS — M533 Sacrococcygeal disorders, not elsewhere classified: Secondary | ICD-10-CM | POA: Diagnosis not present

## 2017-06-12 NOTE — Progress Notes (Signed)
  PROCEDURE RECORD [facility]   Name: Kathleen [name] DOB:[birth year] MRN: [id number]  Date:06/12/2017  Physician: Claudette LawsAndrew Kirsteins, MD    Nurse/CMA: Ashanti Littles, CMA   Allergies:  Allergies  Allergen Reactions  . Clindamycin/Lincomycin Nausea Only    Consent Signed: Yes.    Is patient diabetic? No.  CBG today?   Pregnant: No. LMP: No LMP recorded. Patient is postmenopausal. (age 65-55)  Anticoagulants: no Anti-inflammatory: no Antibiotics: no  Procedure: right sacroiliac steroid injection  Position: Prone Start Time: 12:30pm  End Time: 12:34pm  Fluoro Time: 9s  RN/CMA Julius Boniface, CMA Andrienne Havener, CMA    Time 11:55am 12:38pm    BP 125/73 136/82  `  Pulse 66 68    Respirations 14 14    O2 Sat 99 99    S/S 6 6    Pain Level 4/10 2/10     D/C home with friend, patient A & O X 3, D/C instructions reviewed, and sits independently.

## 2017-06-15 ENCOUNTER — Other Ambulatory Visit: Payer: Self-pay | Admitting: Sports Medicine

## 2017-06-15 DIAGNOSIS — M47816 Spondylosis without myelopathy or radiculopathy, lumbar region: Secondary | ICD-10-CM

## 2017-06-28 ENCOUNTER — Encounter: Payer: Self-pay | Admitting: Physical Medicine & Rehabilitation

## 2017-06-28 NOTE — Progress Notes (Signed)

## 2017-07-03 NOTE — Progress Notes (Signed)

## 2017-07-09 ENCOUNTER — Encounter: Payer: Self-pay | Admitting: Physical Medicine & Rehabilitation

## 2017-07-09 ENCOUNTER — Encounter: Payer: Federal, State, Local not specified - PPO | Attending: Physical Medicine & Rehabilitation

## 2017-07-09 ENCOUNTER — Ambulatory Visit: Payer: Federal, State, Local not specified - PPO | Admitting: Physical Medicine & Rehabilitation

## 2017-07-09 VITALS — BP 132/69 | HR 66 | Resp 14 | Ht 63.0 in | Wt 114.0 lb

## 2017-07-09 DIAGNOSIS — G8929 Other chronic pain: Secondary | ICD-10-CM | POA: Diagnosis present

## 2017-07-09 DIAGNOSIS — M47816 Spondylosis without myelopathy or radiculopathy, lumbar region: Secondary | ICD-10-CM | POA: Insufficient documentation

## 2017-07-09 DIAGNOSIS — M7918 Myalgia, other site: Secondary | ICD-10-CM | POA: Insufficient documentation

## 2017-07-09 DIAGNOSIS — M533 Sacrococcygeal disorders, not elsewhere classified: Secondary | ICD-10-CM

## 2017-07-09 NOTE — Progress Notes (Signed)
Subjective:    Patient ID: Kathleen [name], female    DOB: 1950/10/14, 67 y.o.   MRN: [id number]  HPI 67 year old female with right sided low back pain.  She has had temporary relief with both lumbar facet medial branch blocks targeting the L4-5 L5-S1 facet joints as well as with right sacroiliac injection.  Sacroiliac injection gave at least a 50% relief for about 5 days.  She went on the trip was doing a lot of sitting and her pain became aggravated once again. Patient is exercising on a daily basis walks at least 20 minutes a day in addition she does stretching and core strengthening exercises for about an hour a day.  She has not started her lateral flanks yet.  We also discussed other stretching exercises for the gluteus and piriformis musculature.  Pain Inventory Average Pain 6 Pain Right Now 6 My pain is intermittent, sharp, dull and aching  In the last 24 hours, has pain interfered with the following? General activity 5 Relation with others 5 Enjoyment of life 5 What TIME of day is your pain at its worst? evening Sleep (in general) Fair  Pain is worse with: walking, bending, sitting, inactivity and standing Pain improves with: rest, heat/ice and therapy/exercise Relief from Meds: 5  Mobility walk without assistance how many minutes can you walk? 60-120 ability to climb steps?  yes do you drive?  yes  Function not employed: date last employed .  Neuro/Psych No problems in this area  Prior Studies Any changes since last visit?  no  Physicians involved in your care Any changes since last visit?  no   History reviewed. No pertinent family history. Social History   Socioeconomic History  . Marital status: Married    Spouse name: Not on file  . Number of children: Not on file  . Years of education: Not on file  . Highest education level: Not on file  Occupational History  . Not on file  Social Needs  . Financial resource strain: Not on file  . Food  insecurity:    Worry: Not on file    Inability: Not on file  . Transportation needs:    Medical: Not on file    Non-medical: Not on file  Tobacco Use  . Smoking status: Never Smoker  . Smokeless tobacco: Never Used  Substance and Sexual Activity  . Alcohol use: Not on file  . Drug use: Not on file  . Sexual activity: Not on file  Lifestyle  . Physical activity:    Days per week: Not on file    Minutes per session: Not on file  . Stress: Not on file  Relationships  . Social connections:    Talks on phone: Not on file    Gets together: Not on file    Attends religious service: Not on file    Active member of club or organization: Not on file    Attends meetings of clubs or organizations: Not on file    Relationship status: Not on file  Other Topics Concern  . Not on file  Social History Narrative  . Not on file   History reviewed. No pertinent surgical history. History reviewed. No pertinent past medical history. BP 132/69 (BP Location: Right Arm, Patient Position: Sitting, Cuff Size: Normal)   Pulse 66   Resp 14   Ht  (1.6 m)   Wt 114 lb (51.7 kg)   SpO2 97%   BMI 20.19 kg/m   Opioid  Risk Score:   Fall Risk Score:  `1  Depression screen PHQ 2/9  Depression screen [facility] 2/9 04/02/2017 10/26/2016  Decreased Interest 0 0  Down, Depressed, Hopeless 0 0  PHQ - 2 Score 0 0    Review of Systems  Constitutional: Negative.   HENT: Negative.   Eyes: Negative.   Respiratory: Negative.   Cardiovascular: Negative.   Gastrointestinal: Negative.   Endocrine: Negative.   Genitourinary: Negative.   Musculoskeletal: Positive for arthralgias and back pain.  Skin: Negative.   Allergic/Immunologic: Negative.   Neurological: Negative.   Hematological: Negative.   Psychiatric/Behavioral: Negative.        Objective:   Physical Exam  Constitutional: She appears well-developed and well-nourished. No distress.  HENT:  Head: Normocephalic and atraumatic.  Eyes: Pupils  are equal, round, and reactive to light. EOM are normal.  Neck: Normal range of motion.  Skin: She is not diaphoretic.  Psychiatric: She has a normal mood and affect.  Nursing note and vitals reviewed.  Patient has tenderness over the right PSIS mild tenderness at the lumbosacral junction on the right side. Lumbar range of motion is full with flexion extension lateral bending and rotation Negative straight leg raising Lower extremity Gait is normal       Assessment & Plan:  #1.  Chronic right-sided low back and buttock pain.  As discussed with patient this is a combination between sacroiliac dysfunction, lumbar spondylosis without myelopathy as well as some tightness in the gluteal musculature and possibly piriformis. Recommend that she continues her core strengthening and stretching program.  She has taken a hiatus due to travel. Have discussed another hip external rotator stretching exercise Lateral planks  We discussed other interventional procedures such as radiofrequency neurotomy which can be done in the lumbar as well as sacroiliac areas. She will follow-up in 2 months and we will assess how she is coming along with her exercise program.

## 2017-07-12 ENCOUNTER — Other Ambulatory Visit: Payer: Self-pay | Admitting: Sports Medicine

## 2017-07-12 DIAGNOSIS — M47816 Spondylosis without myelopathy or radiculopathy, lumbar region: Secondary | ICD-10-CM

## 2017-07-12 NOTE — Telephone Encounter (Signed)
Gabapentin : was written with 0 refills are you ok for me to refill?

## 2017-07-16 ENCOUNTER — Other Ambulatory Visit: Payer: Self-pay | Admitting: Sports Medicine

## 2017-07-24 ENCOUNTER — Other Ambulatory Visit: Payer: Self-pay | Admitting: Sports Medicine

## 2017-07-24 DIAGNOSIS — M47816 Spondylosis without myelopathy or radiculopathy, lumbar region: Secondary | ICD-10-CM

## 2017-07-25 ENCOUNTER — Telehealth: Payer: Self-pay

## 2017-07-25 MED ORDER — GABAPENTIN 600 MG PO TABS: 600.0000 mg | ORAL_TABLET | Freq: Every day | ORAL | 3 refills | Status: DC

## 2017-07-25 NOTE — Telephone Encounter (Signed)
Called pt concerning some questions she had concerning her Gabapentin.  On patient's med list, we only have that she is taking Gabapentin 100 mg: 2 capsules in AM, and 2 at lunch.  RX for her Gabapentin  has been taken off of her med list, but she states she is taking 2 tablets of this at night time and is needing a refill for 3 months because she is leaving town for about a month.  Dr T, can you please review and advise if refill is ok?

## 2017-07-25 NOTE — Telephone Encounter (Signed)
I went ahead and refilled it.  She takes various doses through the day which is fine.

## 2017-07-25 NOTE — Telephone Encounter (Signed)
Left pt msg advising her that RX sent to pharmacy. Call back info left in case of any question/concerns/issues with RX.

## 2017-07-25 NOTE — Addendum Note (Signed)
Addended by: Monica Becton on: 07/25/2017 09:42 AM   Modules accepted: Orders

## 2017-08-15 ENCOUNTER — Encounter: Payer: Self-pay | Admitting: Sports Medicine

## 2017-08-15 ENCOUNTER — Other Ambulatory Visit: Payer: Self-pay | Admitting: Sports Medicine

## 2017-08-15 DIAGNOSIS — M47816 Spondylosis without myelopathy or radiculopathy, lumbar region: Secondary | ICD-10-CM

## 2017-08-15 MED ORDER — MELOXICAM 15 MG PO TABS
ORAL_TABLET | ORAL | 3 refills | Status: DC
Start: 2017-08-15 — End: 2017-12-20

## 2017-08-15 MED ORDER — DULOXETINE HCL 30 MG PO CPEP: ORAL_CAPSULE | ORAL | 3 refills | Status: DC

## 2017-09-11 ENCOUNTER — Encounter: Payer: Self-pay | Admitting: Physical Medicine & Rehabilitation

## 2017-09-11 ENCOUNTER — Encounter: Payer: Medicare Other | Attending: Physical Medicine & Rehabilitation

## 2017-09-11 ENCOUNTER — Other Ambulatory Visit: Payer: Self-pay

## 2017-09-11 ENCOUNTER — Ambulatory Visit (HOSPITAL_BASED_OUTPATIENT_CLINIC_OR_DEPARTMENT_OTHER): Payer: Medicare Other | Admitting: Physical Medicine & Rehabilitation

## 2017-09-11 VITALS — BP 124/76 | HR 65 | Ht 63.5 in | Wt 112.4 lb

## 2017-09-11 DIAGNOSIS — G8929 Other chronic pain: Secondary | ICD-10-CM | POA: Diagnosis present

## 2017-09-11 DIAGNOSIS — M47816 Spondylosis without myelopathy or radiculopathy, lumbar region: Secondary | ICD-10-CM | POA: Insufficient documentation

## 2017-09-11 DIAGNOSIS — M533 Sacrococcygeal disorders, not elsewhere classified: Secondary | ICD-10-CM | POA: Diagnosis not present

## 2017-09-11 DIAGNOSIS — M7918 Myalgia, other site: Secondary | ICD-10-CM | POA: Diagnosis present

## 2017-09-11 NOTE — Progress Notes (Signed)
Subjective:    Patient ID: Kathleen [name], female    DOB: April 22, 1950, 67 y.o.   MRN: [id number]  HPI  67 year old female with chronic right-sided low back buttock pain.  The pain also radiates down to the knee and into the pretibial area but not into the foot.  This is not associated with numbness or tingling.  She has no progressive weakness.  No bowel or bladder dysfunction. She has been on vacation for a month and did not receive any physical therapy.  She has now resumed physical therapy.  Right buttocks pain has improved by > 50% with right sacroiliac injections Patient has not had sacroiliac nerve blocks.  Diagnosed with Sjogren sees rheumatology, started on medication for dry mouth.  Also on hydroxychloroquine    Pain Inventory Average Pain 6 Pain Right Now 5 My pain is constant, dull and stabbing  In the last 24 hours, has pain interfered with the following? General activity 5 Relation with others 4 Enjoyment of life 5 What TIME of day is your pain at its worst? evening Sleep (in general) Good  Pain is worse with: walking, sitting, inactivity and standing Pain improves with: rest, heat/ice and therapy/exercise Relief from Meds: 5  Mobility walk without assistance ability to climb steps?  yes do you drive?  yes  Function not employed: date last employed n/a  Neuro/Psych No problems in this area  Prior Studies Any changes since last visit?  no  Physicians involved in your care dentist   No family history on file. Social History   Socioeconomic History  . Marital status: Married    Spouse name: Not on file  . Number of children: Not on file  . Years of education: Not on file  . Highest education level: Not on file  Occupational History  . Not on file  Social Needs  . Financial resource strain: Not on file  . Food insecurity:    Worry: Not on file    Inability: Not on file  . Transportation needs:    Medical: Not on file    Non-medical: Not on  file  Tobacco Use  . Smoking status: Never Smoker  . Smokeless tobacco: Never Used  Substance and Sexual Activity  . Alcohol use: Not on file  . Drug use: Not on file  . Sexual activity: Not on file  Lifestyle  . Physical activity:    Days per week: Not on file    Minutes per session: Not on file  . Stress: Not on file  Relationships  . Social connections:    Talks on phone: Not on file    Gets together: Not on file    Attends religious service: Not on file    Active member of club or organization: Not on file    Attends meetings of clubs or organizations: Not on file    Relationship status: Not on file  Other Topics Concern  . Not on file  Social History Narrative  . Not on file   No past surgical history on file. No past medical history on file. BP 124/76   Pulse 65   Ht 5' 3.5" (1.613 m)   Wt 112 lb 6.4 oz (51 kg)   SpO2 97%   BMI 19.60 kg/m   Opioid Risk Score:   Fall Risk Score:  `1  Depression screen PHQ 2/9  Depression screen [facility] 2/9 09/11/2017 04/02/2017 10/26/2016  Decreased Interest 0 0 0  Down, Depressed, Hopeless 0 0 0  PHQ - 2 Score 0 0 0   Review of Systems  Constitutional: Negative.   HENT: Negative.   Eyes: Negative.   Respiratory: Negative.   Cardiovascular: Negative.   Gastrointestinal: Negative.   Endocrine: Negative.   Genitourinary: Negative.   Musculoskeletal: Negative.   Skin: Negative.   Allergic/Immunologic: Negative.   Neurological: Negative.   Hematological: Negative.   Psychiatric/Behavioral: Negative.   All other systems reviewed and are negative.      Objective:   Physical Exam  Constitutional: She appears well-developed and well-nourished. No distress.  HENT:  Head: Normocephalic and atraumatic.  Eyes: Pupils are equal, round, and reactive to light. EOM are normal.  Neck: Normal range of motion.  Musculoskeletal:  Positive Faber's right sacroiliac area Negative straight leg raising Normal lumbar range of motion.    Neurological:  Motor strength 5/5 bilateral hip flexion extensor ankle but flexor No sensory deficits in the lower extremities.  Skin: Skin is warm and dry. She is not diaphoretic.  Psychiatric: She has a normal mood and affect. Her behavior is normal. Judgment and thought content normal.  Nursing note and vitals reviewed.         Assessment & Plan:  #1.  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  Discussed other conservative treatment options.  In addition of physical therapy she may benefit from chiropractic adjustment of the right sacroiliac joint. Will trial L5 dorsal ramus, S1-S2-S3 lateral branch blocks if no relief with chiropractic care.

## 2017-09-11 NOTE — Patient Instructions (Addendum)
[facility]. [facility] [address] [place], [place] [id number] [phone]   L5 dorsal ramus , S1-2-3 lateral branch blocks to denervate the sacroiliac

## 2017-10-07 ENCOUNTER — Other Ambulatory Visit: Payer: Self-pay | Admitting: Sports Medicine

## 2017-10-07 DIAGNOSIS — M47816 Spondylosis without myelopathy or radiculopathy, lumbar region: Secondary | ICD-10-CM

## 2017-10-15 ENCOUNTER — Telehealth: Payer: Self-pay | Admitting: Physical Medicine & Rehabilitation

## 2017-10-15 NOTE — Telephone Encounter (Signed)
Have clinical ?'s regarding injections that Dr Kirtland BouchardK discussed with her at last visit.  Thanks, Rosezella FloridaLisa M

## 2017-10-16 NOTE — Telephone Encounter (Signed)
Left VM that I was returning her call and to answer questions.

## 2017-10-17 NOTE — Telephone Encounter (Signed)
I spoke with Ms Kathleen [name] and she has decided to schedule the appt for the L5 dorsal ramus lateral branch blocks.

## 2017-10-22 NOTE — Progress Notes (Signed)
[facility] [address] [place], [place] [address] Phone: [phone] Subjective:     I Ronelle Nigh am serving as a Neurosurgeon for Dr. Antoine Primas.   CC: Presenting with long-standing neck, knee and back pain  [id number]  Kathleen [name] is a 67 y.o. female coming in with complaint of left knee and back pain. States that she sees a PT regularly for her pain. 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 Worse on the right starting on the left. Patellar tendinosis. Wears a knee  Brace. History of mensicus tear. Has had some injections. ADL are becoming more difficult. Has pain that shoots down the right leg anteriorly and stops at the ankle. Wrist is also painful. Pain radiates to the thumb. Achy pain. Slightly swollen.  Onset- Chronic Location- Knee and back Duration-  Character- Achy tightness in the back, knee pain is sharp, SI joint pain is sharp Aggravating factors- sitting, twisting Reliving factors-  Therapies tried- Gabapentin  Severity-8 out of 10  Patient has seen numerous providers previously.  Has had significant work-up including MRI.  Last MRI was from December 2018.  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  Has had numerous facet injections previously.  Patient is now seeing pain management and has had left sacral injection with minimal results so far.  Possible recent diagnosis of Sjogren's syndrome as well     History reviewed. No pertinent past medical history. History reviewed. No pertinent surgical history. Social History   Socioeconomic History  . Marital status: Married    Spouse name: Not on file  . Number of children: Not on file  . Years of education: Not on file  . Highest education level: Not on file  Occupational History  . Not on file  Social Needs  . Financial resource strain: Not on file  . Food insecurity:    Worry: Not on file    Inability:  Not on file  . Transportation needs:    Medical: Not on file    Non-medical: Not on file  Tobacco Use  . Smoking status: Never Smoker  . Smokeless tobacco: Never Used  Substance and Sexual Activity  . Alcohol use: Not on file  . Drug use: Not on file  . Sexual activity: Not on file  Lifestyle  . Physical activity:    Days per week: Not on file    Minutes per session: Not on file  . Stress: Not on file  Relationships  . Social connections:    Talks on phone: Not on file    Gets together: Not on file    Attends religious service: Not on file    Active member of club or organization: Not on file    Attends meetings of clubs or organizations: Not on file    Relationship status: Not on file  Other Topics Concern  . Not on file  Social History Narrative  . Not on file   Allergies  Allergen Reactions  . Clindamycin/Lincomycin Nausea Only   History reviewed. No pertinent family history.  Current Outpatient Medications (Endocrine & Metabolic):  .  levothyroxine (LEVOXYL) 75 MCG tablet, Take 75 mcg by mouth daily before breakfast.   Current Outpatient Medications (Respiratory):  .  fexofenadine (ALLEGRA) 180 MG tablet, Take 180 mg by mouth daily. Marland Kitchen  FLUTICASONE PROPIONATE, INHAL, IN, Inhale into the lungs. .  montelukast (SINGULAIR) 10 MG tablet, Take 10 mg  by mouth at bedtime.  Current Outpatient Medications (Analgesics):  .  meloxicam (MOBIC) 15 MG tablet, TAKE 1 TABLET BY MOUTH EVERY MORNING WITH BREAKFAST FOR 2 WEEKS, THAN TAKE DAILY AS NEEDED FOR PAIN .  traMADol (ULTRAM) 50 MG tablet, Take 1 tablet (50 mg total) by mouth 3 (three) times daily as needed.   Current Outpatient Medications (Other):  Marland Kitchen  Alpha-Lipoic Acid 300 MG CAPS,  .  Ascorbic Acid (VITAMIN C) 100 MG tablet, Take 100 mg by mouth daily. Marland Kitchen  CEVIMELINE HCL PO,  .  Coenzyme Q10 (COQ10) 200 MG CAPS, Take by mouth. .  diazepam (VALIUM) 5 MG tablet, Take 1 tab PO 1 hour before procedure or imaging. .   diclofenac sodium (VOLTAREN) 1 % GEL, Apply 1 application topically 4 (four) times daily. .  DULoxetine (CYMBALTA) 30 MG capsule, TAKE 1 CAPSULE(30 MG) BY MOUTH DAILY .  gabapentin (NEURONTIN) 100 MG capsule, TAKE 2 CAPSULES BY MOUTH EVERY MORNING AND TAKE 2 CAPSULES AT LUNCH .  gabapentin (NEURONTIN) 600 MG tablet, Take 1 tablet (600 mg total) by mouth at bedtime. .  Lactobacillus Rhamnosus, GG, (CULTURELLE) CAPS, Take by mouth. .  MELATONIN PO, Take by mouth. .  Omega-3 Fatty Acids (FISH OIL) 1000 MG CAPS, Take by mouth. Marland Kitchen  omeprazole (PRILOSEC) 40 MG capsule, Take 40 mg by mouth daily. Marland Kitchen  zolpidem (AMBIEN) 10 MG tablet, Take 10 mg by mouth at bedtime as needed for sleep. .  Vitamin D, Ergocalciferol, (DRISDOL) 50000 units CAPS capsule, Take 1 capsule (50,000 Units total) by mouth every 7 (seven) days.    Past medical history, social, surgical and family history all reviewed in electronic medical record.  No pertanent information unless stated regarding to the chief complaint.   Review of Systems:  No headache, visual changes, nausea, vomiting, diarrhea, constipation, dizziness, abdominal pain, skin rash, fevers, chills, night sweats, weight loss, swollen lymph nodes, body aches, joint swelling, muscle aches, chest pain, shortness of breath, mood changes.   Objective  Blood pressure (!) 142/72, pulse 70, height 5' 3.5" (1.613 m), weight 115 lb (52.2 kg), SpO2 98 %.    General: No apparent distress alert and oriented x3 mood and affect normal, dressed appropriately.  HEENT: Pupils equal, extraocular movements intact  Respiratory: Patient's speak in full sentences and does not appear short of breath  Cardiovascular: No lower extremity edema, non tender, no erythema  Skin: Warm dry intact with no signs of infection or rash on extremities or on axial skeleton.  Abdomen: Soft nontender  Neuro: Cranial nerves II through XII are intact, neurovascularly intact in all extremities with 2+ DTRs  and 2+ pulses.  Lymph: No lymphadenopathy of posterior or anterior cervical chain or axillae bilaterally.  Gait mild antalgic.  MSK:  tender with full range of motion and good stability and symmetric strength and tone of shoulders, elbows, wrist, hip, and ankles bilaterally.  Back Exam:  Inspection: Loss of lordosis with mild degenerative scoliosis Motion: Flexion 45 deg, Extension 25 deg, Side Bending to 35 deg bilaterally,  Rotation to 30 deg bilaterally  SLR laying: Negative  XSLR laying: Negative  Palpable tenderness: Tender to palpation the paraspinal musculature lumbar spine right greater than left. FABER: Severe tenderness on the right. Sensory change: Gross sensation intact to all lumbar and sacral dermatomes.  Reflexes: 2+ at both patellar tendons, 2+ at achilles tendons, Babinski's downgoing.  Strength at foot  Plantar-flexion: 5/5 Dorsi-flexion: 5/5 Eversion: 5/5 Inversion: 5/5  Leg strength  Quad: 5/5  Hamstring: 5/5 Hip flexor: 5/5 Hip abductors: 4/5 but symmetric Gait unremarkable.  [id number]; 15 additional minutes spent for Therapeutic exercises as stated in above notes.  This included exercises focusing on stretching, strengthening, with significant focus on eccentric aspects.   Long term goals include an improvement in range of motion, strength, endurance as well as avoiding reinjury. Patient's frequency would include in 1-2 times a day, 3-5 times a week for a duration of 6-12 weeks.Low back exercises that included:  Pelvic tilt/bracing instruction to focus on control of the pelvic girdle and lower abdominal muscles  Glute strengthening exercises, focusing on proper firing of the glutes without engaging the low back muscles Proper stretching techniques for maximum relief for the hamstrings, hip flexors, low back and some rotation where tolerated    Proper technique shown and discussed handout in great detail with ATC.  All questions were discussed and answered.     Impression  and Recommendations:     This case required medical decision making of moderate complexity. The above documentation has been reviewed and is accurate and complete Judi SaaZachary M Hulen Mandler, DO       Note: This dictation was prepared with Dragon dictation along with smaller phrase technology. Any transcriptional errors that result from this process are unintentional.

## 2017-10-23 ENCOUNTER — Ambulatory Visit (INDEPENDENT_AMBULATORY_CARE_PROVIDER_SITE_OTHER): Payer: Medicare Other | Admitting: Family Medicine

## 2017-10-23 ENCOUNTER — Encounter: Payer: Self-pay | Admitting: Family Medicine

## 2017-10-23 DIAGNOSIS — M47816 Spondylosis without myelopathy or radiculopathy, lumbar region: Secondary | ICD-10-CM

## 2017-10-23 DIAGNOSIS — G5701 Lesion of sciatic nerve, right lower limb: Secondary | ICD-10-CM | POA: Diagnosis not present

## 2017-10-23 MED ORDER — VITAMIN D (ERGOCALCIFEROL) 1.25 MG (50000 UNIT) PO CAPS
50000.0000 [IU] | ORAL_CAPSULE | ORAL | 0 refills | Status: DC
Start: 2017-10-23 — End: 2018-01-11

## 2017-10-23 NOTE — Patient Instructions (Addendum)
Good to see you  Piriformis syndrome  Ice 20 minutes 2 times daily. Usually after activity and before bed. Exercises 3 times a week.   Once weekly vitamin D for 12 weeks Turmeric 500mg  daily  Tart cherry extract any dose at night Lets see how the injection does as well  Continue with the PT  See me again in 4 weeks

## 2017-10-23 NOTE — Assessment & Plan Note (Signed)
Patient does have significant facet arthropathy.  I do believe the patient has some muscle weakness.  Discussed with patient at great length about different medications he can drive.  Do not feel the patient is a candidate for manipulation at this moment.  We discussed protein supplementation, over-the-counter medications, icing regimen, gabapentin which patient has taken in the past.  Patient also has swelling signs of some piriformis and may need to consider the possibility of injections at follow-up.  Follow-up again in 4 to 6 weeks patient has had thorough work-up at this time and no advanced imaging is necessary

## 2017-10-23 NOTE — Assessment & Plan Note (Signed)

## 2017-11-13 ENCOUNTER — Encounter: Payer: Medicare Other | Attending: Physical Medicine & Rehabilitation

## 2017-11-13 ENCOUNTER — Ambulatory Visit (HOSPITAL_BASED_OUTPATIENT_CLINIC_OR_DEPARTMENT_OTHER): Payer: Medicare Other | Admitting: Physical Medicine & Rehabilitation

## 2017-11-13 ENCOUNTER — Other Ambulatory Visit: Payer: Self-pay

## 2017-11-13 DIAGNOSIS — M7918 Myalgia, other site: Secondary | ICD-10-CM | POA: Insufficient documentation

## 2017-11-13 DIAGNOSIS — G8929 Other chronic pain: Secondary | ICD-10-CM | POA: Diagnosis not present

## 2017-11-13 DIAGNOSIS — M47816 Spondylosis without myelopathy or radiculopathy, lumbar region: Secondary | ICD-10-CM | POA: Diagnosis not present

## 2017-11-13 DIAGNOSIS — M533 Sacrococcygeal disorders, not elsewhere classified: Secondary | ICD-10-CM

## 2017-11-13 NOTE — Progress Notes (Signed)
  PROCEDURE RECORD [facility]   Name: Kathleen [name] DOB:1950/12/05 MRN: [id number]  Date:11/13/2017  Physician: Claudette LawsAndrew Kirsteins, MD    Nurse/CMA: Leroy KennedyLee,CMA  Allergies:  Allergies  Allergen Reactions  . Clindamycin/Lincomycin Nausea Only    Consent Signed: Yes.    Is patient diabetic? No.  CBG today?   Pregnant: No. LMP: No LMP recorded. Patient is postmenopausal. (age 65-55)  Anticoagulants: no Anti-inflammatory: yes (Meloxicam) Antibiotics: no  Procedure: Right Lateral Branch Block Position: Prone Start Time: 2:07pm End Time:2:13pm  Fluoro Time: 28  RN/CMA Ellana Kawa, CMA Devone Tousley,CMA    Time 1:46pm 2:20pm    BP 121/72 127/75    Pulse 65 63    Respirations 14 14    O2 Sat 96 99    S/S 6 6    Pain Level 5/10 1/10     D/C home with husband, patient A & O X 3, D/C instructions reviewed, and sits independently.

## 2017-11-13 NOTE — Progress Notes (Signed)
L5 dorsal ramus S1-S2-S3 lateral branch blocks under fluoroscopic guidance Right side  Informed consent was obtained after describing risks and benefits of the procedure with patient these include bleeding bruising and infection.  He elects to proceed and has given written consent.  Patient placed prone on fluoroscopy table Betadine prep sterile drape a 25-gauge 1.5 inch needle was used to anesthetize skin and subcu tissue with 1% lidocaine 1 cc into each of 4 sites.  Then a 22-gauge 3.5" needle was inserted under fluoroscopic guidance for starting the S1 SAP sacral ala junction.  Bone contact made.  Isovue 200 x 0.5 mL demonstrated no intravascular uptake then 0.5 mL of 2% lidocaine was injected.  Then the lateral aspect of the S1, S2, S3 foramen was targeted.  Bone contact made out, Isovue-200 times 0.5 mL demonstrated no nerve root or intravascular uptake within 0.5 mL of 2% lidocaine solution was injected after negative drawback for blood.  Patient tolerated procedure well.  Postinjection instructions given. 

## 2017-11-13 NOTE — Patient Instructions (Signed)
We blocked the nerves to the right sacroiliac joint.  Please track your % pain relief over the next 24-48hr and bring me the results at next visit

## 2017-11-27 ENCOUNTER — Ambulatory Visit (HOSPITAL_BASED_OUTPATIENT_CLINIC_OR_DEPARTMENT_OTHER): Payer: Medicare Other | Admitting: Physical Medicine & Rehabilitation

## 2017-11-27 ENCOUNTER — Encounter: Payer: Medicare Other | Attending: Physical Medicine & Rehabilitation

## 2017-11-27 ENCOUNTER — Encounter: Payer: Self-pay | Admitting: Physical Medicine & Rehabilitation

## 2017-11-27 VITALS — BP 116/71 | HR 67 | Ht 63.0 in | Wt 113.4 lb

## 2017-11-27 DIAGNOSIS — M7918 Myalgia, other site: Secondary | ICD-10-CM | POA: Diagnosis present

## 2017-11-27 DIAGNOSIS — M533 Sacrococcygeal disorders, not elsewhere classified: Secondary | ICD-10-CM

## 2017-11-27 DIAGNOSIS — G8929 Other chronic pain: Secondary | ICD-10-CM

## 2017-11-27 DIAGNOSIS — M47816 Spondylosis without myelopathy or radiculopathy, lumbar region: Secondary | ICD-10-CM | POA: Diagnosis present

## 2017-11-27 NOTE — Progress Notes (Signed)
[facility] [address] [place], [place] [address] Phone: [phone] Subjective:   Bruce Donath, am serving as a scribe for Dr. Antoine Primas.    CC: Back pain  [id number]  Kathleen [name] is a 67 y.o. female coming in with complaint of back pain. She has had a block in her SI joint. Is going to proceed with ablation following one more test injection that she will schedule next week. Continues to have good days and bad days but is always worse near the end of the day.   Does continue to wear a knee brace for her knee pain. Knee seems to be getting worse with activity. Patient woke up one day with her knee bothering her. Pain was behind patella which is new for her. Pain did subside after a couple of days.  Patient states actually today feeling much better though.    No past medical history on file. No past surgical history on file. Social History   Socioeconomic History  . Marital status: Married    Spouse name: Not on file  . Number of children: Not on file  . Years of education: Not on file  . Highest education level: Not on file  Occupational History  . Not on file  Social Needs  . Financial resource strain: Not on file  . Food insecurity:    Worry: Not on file    Inability: Not on file  . Transportation needs:    Medical: Not on file    Non-medical: Not on file  Tobacco Use  . Smoking status: Never Smoker  . Smokeless tobacco: Never Used  Substance and Sexual Activity  . Alcohol use: Not on file  . Drug use: Not on file  . Sexual activity: Not on file  Lifestyle  . Physical activity:    Days per week: Not on file    Minutes per session: Not on file  . Stress: Not on file  Relationships  . Social connections:    Talks on phone: Not on file    Gets together: Not on file    Attends religious service: Not on file    Active member of club or organization: Not on file    Attends meetings of clubs or organizations: Not on file     Relationship status: Not on file  Other Topics Concern  . Not on file  Social History Narrative  . Not on file   Allergies  Allergen Reactions  . Clindamycin/Lincomycin Nausea Only   No family history on file.  Current Outpatient Medications (Endocrine & Metabolic):  .  levothyroxine (LEVOXYL) 75 MCG tablet, Take 75 mcg by mouth daily before breakfast.   Current Outpatient Medications (Respiratory):  .  fexofenadine (ALLEGRA) 180 MG tablet, Take 180 mg by mouth daily. Marland Kitchen  FLUTICASONE PROPIONATE, INHAL, IN, Inhale into the lungs. .  montelukast (SINGULAIR) 10 MG tablet, Take 10 mg by mouth at bedtime.  Current Outpatient Medications (Analgesics):  .  meloxicam (MOBIC) 15 MG tablet, TAKE 1 TABLET BY MOUTH EVERY MORNING WITH BREAKFAST FOR 2 WEEKS, THAN TAKE DAILY AS NEEDED FOR PAIN .  traMADol (ULTRAM) 50 MG tablet, Take 1 tablet (50 mg total) by mouth 3 (three) times daily as needed.   Current Outpatient Medications (Other):  Marland Kitchen  Alpha-Lipoic Acid 300 MG CAPS,  .  Ascorbic Acid (VITAMIN C) 100 MG tablet, Take 100 mg by mouth daily. Marland Kitchen  CEVIMELINE HCL PO,  .  Coenzyme Q10 (  COQ10) 200 MG CAPS, Take by mouth. .  diazepam (VALIUM) 5 MG tablet, Take 1 tab PO 1 hour before procedure or imaging. .  diclofenac sodium (VOLTAREN) 1 % GEL, Apply 1 application topically 4 (four) times daily. .  DULoxetine (CYMBALTA) 30 MG capsule, TAKE 1 CAPSULE(30 MG) BY MOUTH DAILY .  gabapentin (NEURONTIN) 100 MG capsule, TAKE 2 CAPSULES BY MOUTH EVERY MORNING AND TAKE 2 CAPSULES AT LUNCH .  gabapentin (NEURONTIN) 600 MG tablet, Take 1 tablet (600 mg total) by mouth at bedtime. .  Lactobacillus Rhamnosus, GG, (CULTURELLE) CAPS, Take by mouth. .  MELATONIN PO, Take by mouth. .  Omega-3 Fatty Acids (FISH OIL) 1000 MG CAPS, Take by mouth. Marland Kitchen  omeprazole (PRILOSEC) 40 MG capsule, Take 40 mg by mouth daily. .  Vitamin D, Ergocalciferol, (DRISDOL) 50000 units CAPS capsule, Take 1 capsule (50,000 Units total)  by mouth every 7 (seven) days. Marland Kitchen  zolpidem (AMBIEN) 10 MG tablet, Take 10 mg by mouth at bedtime as needed for sleep.    Past medical history, social, surgical and family history all reviewed in electronic medical record.  No pertanent information unless stated regarding to the chief complaint.   Review of Systems:  No headache, visual changes, nausea, vomiting, diarrhea, constipation, dizziness, abdominal pain, skin rash, fevers, chills, night sweats, weight loss, swollen lymph nodes, , chest pain, shortness of breath, mood changes.  Positive muscle aches, joint swelling, body aches  Objective  Blood pressure 102/76, pulse 65, height 5\' 3"  (1.6 m), weight 113 lb (51.3 kg), SpO2 98 %.    General: No apparent distress alert and oriented x3 mood and affect normal, dressed appropriately.  HEENT: Pupils equal, extraocular movements intact  Respiratory: Patient's speak in full sentences and does not appear short of breath  Cardiovascular: No lower extremity edema, non tender, no erythema  Skin: Warm dry intact with no signs of infection or rash on extremities or on axial skeleton.  Abdomen: Soft nontender  Neuro: Cranial nerves II through XII are intact, neurovascularly intact in all extremities with 2+ DTRs and 2+ pulses.  Lymph: No lymphadenopathy of posterior or anterior cervical chain or axillae bilaterally.  Gait antalgic MSK:  Non tender with full range of motion and good stability and symmetric strength and tone of shoulders, elbows, wrist, hip, and ankles bilaterally.  Arthritic changes of multiple joints.  Left knee exam fairly unremarkable except for some mild lateral tracking of the kneecap and some tightness of the patella tendon.  My exam shows loss of lordosis.  Patient does have tenderness to palpation in all the paraspinal musculature of the lumbar spine.  Mild tenderness changes noted.  Tenderness over the sacroiliac joint as well as the piriformis bilaterally right greater  than left.  Positive Pearlean Brownie.  Significant tightness with straight leg test.    Impression and Recommendations:     This case required medical decision making of moderate complexity. The above documentation has been reviewed and is accurate and complete Judi Saa, DO       Note: This dictation was prepared with Dragon dictation along with smaller phrase technology. Any transcriptional errors that result from this process are unintentional.

## 2017-11-27 NOTE — Progress Notes (Signed)
Subjective:    Patient ID: Kathleen [name], female    DOB: 1950/10/14, 67 y.o.   MRN: [id number]  HPI Right hip , buttocks pain are main c/os. >50% relief from Right L5 dorsal ramus, Right S1,2,3 lateral branch blocks under fluoro guidance  Pain Inventory Average Pain 6 Pain Right Now 2 My pain is intermittent, sharp, stabbing and aching  In the last 24 hours, has pain interfered with the following? General activity 5 Relation with others 6 Enjoyment of life 5 What TIME of day is your pain at its worst? evening Sleep (in general) Good  Pain is worse with: walking, sitting, inactivity, standing and some activites Pain improves with: rest, heat/ice and therapy/exercise Relief from Meds: 5  Mobility walk without assistance ability to climb steps?  yes do you drive?  yes  Function Do you have any goals in this area?  no  Neuro/Psych No problems in this area  Prior Studies Any changes since last visit?  no  Physicians involved in your care Any changes since last visit?  no   No family history on file. Social History   Socioeconomic History  . Marital status: Married    Spouse name: Not on file  . Number of children: Not on file  . Years of education: Not on file  . Highest education level: Not on file  Occupational History  . Not on file  Social Needs  . Financial resource strain: Not on file  . Food insecurity:    Worry: Not on file    Inability: Not on file  . Transportation needs:    Medical: Not on file    Non-medical: Not on file  Tobacco Use  . Smoking status: Never Smoker  . Smokeless tobacco: Never Used  Substance and Sexual Activity  . Alcohol use: Not on file  . Drug use: Not on file  . Sexual activity: Not on file  Lifestyle  . Physical activity:    Days per week: Not on file    Minutes per session: Not on file  . Stress: Not on file  Relationships  . Social connections:    Talks on phone: Not on file    Gets together: Not on file    Attends religious service: Not on file    Active member of club or organization: Not on file    Attends meetings of clubs or organizations: Not on file    Relationship status: Not on file  Other Topics Concern  . Not on file  Social History Narrative  . Not on file   No past surgical history on file. No past medical history on file. BP 116/71   Pulse 67   Ht 5\' 3"  (1.6 m)   Wt 113 lb 6.4 oz (51.4 kg)   SpO2 98%   BMI 20.09 kg/m   Opioid Risk Score:   Fall Risk Score:  `1  Depression screen PHQ 2/9  Depression screen [facility] 2/9 11/13/2017 09/11/2017 04/02/2017 10/26/2016  Decreased Interest 0 0 0 0  Down, Depressed, Hopeless 0 0 0 0  PHQ - 2 Score 0 0 0 0     Review of Systems  Constitutional: Negative.   HENT: Negative.   Eyes: Negative.   Respiratory: Negative.   Cardiovascular: Negative.   Gastrointestinal: Negative.   Endocrine: Negative.   Genitourinary: Negative.   Musculoskeletal: Positive for arthralgias and myalgias.  Skin: Negative.   Allergic/Immunologic: Negative.   Neurological: Negative.   Hematological: Negative.   Psychiatric/Behavioral:  Negative.   All other systems reviewed and are negative.      Objective:   Physical Exam  Constitutional: She is oriented to person, place, and time. She appears well-developed and well-nourished. No distress.  HENT:  Head: Normocephalic and atraumatic.  Eyes: Pupils are equal, round, and reactive to light. EOM are normal.  Musculoskeletal: Normal range of motion.  Neurological: She is alert and oriented to person, place, and time.  Skin: She is not diaphoretic.  Nursing note and vitals reviewed.  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  Chronic right sided low back pain. THe  pt has facet arthropathy on and had short term relief with Lumbar medial branch blocks The pt also had intra articular Sacroiliac injections on 2 occasions and one set of L5 dorsal ramus, S1,2,3 lateral branch blocks with good short term relief. Her pain has persisted despite PT, OTC analgesics, tramadol, gabapentin, averages 6/10 and interferes with ADLS and mobility Will repeat  L5 dorsal ramus, S1,2,3 lateral branch blocks and proceed with radiofrequency neurotomy of those same levels if >50% short term relief  Long discussion regarding RF procedure as well as other potential diagnoses such as piriformis syndrome.  Over half of the 25 min visit was spent counseling and coordinating care.

## 2017-11-28 ENCOUNTER — Ambulatory Visit (INDEPENDENT_AMBULATORY_CARE_PROVIDER_SITE_OTHER): Payer: Medicare Other | Admitting: Family Medicine

## 2017-11-28 ENCOUNTER — Encounter: Payer: Self-pay | Admitting: Family Medicine

## 2017-11-28 VITALS — BP 102/76 | HR 65 | Ht 63.0 in | Wt 113.0 lb

## 2017-11-28 DIAGNOSIS — M7652 Patellar tendinitis, left knee: Secondary | ICD-10-CM

## 2017-11-28 DIAGNOSIS — M47816 Spondylosis without myelopathy or radiculopathy, lumbar region: Secondary | ICD-10-CM | POA: Diagnosis not present

## 2017-11-28 DIAGNOSIS — G5701 Lesion of sciatic nerve, right lower limb: Secondary | ICD-10-CM

## 2017-11-28 NOTE — Assessment & Plan Note (Signed)
Stable at the moment.  Continue to monitor.  Follow-up again if any worsening symptoms.

## 2017-11-28 NOTE — Patient Instructions (Signed)
Good to see you  Sorry not a lot better We can always consider a piriformis injection or SI joint.  I think you ar eon the right track though  If stopping one of my medicines stop one at a time Ice si your friend.  Send me a message if you have questions  And see me when you need me

## 2017-11-28 NOTE — Assessment & Plan Note (Signed)
Patient continues to have chronic back pain.  Was seen multiple providers for this.  Did not feel comfortable making significant changes at this time.  Encourage patient to continue with the injections at this point and I think will be beneficial.  We did discuss the possibility of the piriformis injections and patient has elected to continue with the course that she is on at this moment.  We discussed icing regimen other over-the-counter medications.  As long as patient is well she can follow-up with me as needed.  Spent  25 minutes with patient face-to-face and had greater than 50% of counseling including as described above in assessment and plan.

## 2017-12-06 ENCOUNTER — Encounter: Payer: Self-pay | Admitting: Physical Medicine & Rehabilitation

## 2017-12-06 ENCOUNTER — Ambulatory Visit (HOSPITAL_BASED_OUTPATIENT_CLINIC_OR_DEPARTMENT_OTHER): Payer: Medicare Other | Admitting: Physical Medicine & Rehabilitation

## 2017-12-06 VITALS — BP 148/79 | HR 61 | Ht 63.0 in | Wt 114.0 lb

## 2017-12-06 DIAGNOSIS — M533 Sacrococcygeal disorders, not elsewhere classified: Secondary | ICD-10-CM | POA: Diagnosis not present

## 2017-12-06 DIAGNOSIS — M47816 Spondylosis without myelopathy or radiculopathy, lumbar region: Secondary | ICD-10-CM

## 2017-12-06 MED ORDER — DIAZEPAM 5 MG PO TABS: ORAL_TABLET | ORAL | 0 refills | Status: DC

## 2017-12-06 NOTE — Progress Notes (Signed)
  PROCEDURE RECORD [facility]   Name: Kathleen [name] DOB:February 14, 1951 MRN: [id number]  Date:12/06/2017  Physician: Claudette Laws, MD    Nurse/CMA: Bright, CMA  Allergies:  Allergies  Allergen Reactions  . Clindamycin/Lincomycin Nausea Only    Consent Signed: Yes.    Is patient diabetic? No.  CBG today? NA  Pregnant: No. LMP: No LMP recorded. Patient is postmenopausal. (age 65-55)  Anticoagulants: no Anti-inflammatory: yes (meloxicam) Antibiotics: no  Procedure: right L5 dorsal ramus, S1-3 lateral branch block  Position: Prone   Start Time: 140pm End Time: 148pm Fluoro Time: 29s  RN/CMA Kathleen [name], CMA Bright, CMA    Time 1:05pm 150pm    BP 148/79 153/78    Pulse 60 60    Respirations 16 16    O2 Sat 98 98    S/S 6 6    Pain Level 5/10 0/10     D/C home with husband, patient A & O X 3, D/C instructions reviewed, and sits independently.

## 2017-12-06 NOTE — Progress Notes (Signed)
L5 dorsal ramus S1-S2-S3 lateral branch blocks under fluoroscopic guidance Right side  Informed consent was obtained after describing risks and benefits of the procedure with patient these include bleeding bruising and infection.  He elects to proceed and has given written consent.  Patient placed prone on fluoroscopy table Betadine prep sterile drape a 25-gauge 1.5 inch needle was used to anesthetize skin and subcu tissue with 1% lidocaine 1 cc into each of 4 sites.  Then a 22-gauge 3.5" needle was inserted under fluoroscopic guidance for starting the S1 SAP sacral ala junction.  Bone contact made.  Isovue 200 x 0.5 mL demonstrated no intravascular uptake then 0.5 mL of 2% lidocaine was injected.  Then the lateral aspect of the S1, S2, S3 foramen was targeted.  Bone contact made out, Isovue-200 times 0.5 mL demonstrated no nerve root or intravascular uptake within 0.5 mL of 2% lidocaine solution was injected after negative drawback for blood.  Patient tolerated procedure well.  Postinjection instructions given. 

## 2017-12-20 ENCOUNTER — Other Ambulatory Visit: Payer: Self-pay | Admitting: Sports Medicine

## 2017-12-20 DIAGNOSIS — M47816 Spondylosis without myelopathy or radiculopathy, lumbar region: Secondary | ICD-10-CM

## 2017-12-20 NOTE — Telephone Encounter (Signed)
Message from patient

## 2017-12-24 ENCOUNTER — Telehealth: Payer: Self-pay | Admitting: *Deleted

## 2017-12-24 NOTE — Telephone Encounter (Signed)
I spoke with patient about her upcoming procedure. She has agreed to cancel the appt and call back to reschedule RF when pain flares up

## 2017-12-25 ENCOUNTER — Ambulatory Visit: Payer: Medicare Other | Admitting: Physical Medicine & Rehabilitation

## 2018-01-01 ENCOUNTER — Ambulatory Visit (INDEPENDENT_AMBULATORY_CARE_PROVIDER_SITE_OTHER): Payer: Medicare Other | Admitting: Sports Medicine

## 2018-01-01 DIAGNOSIS — M722 Plantar fascial fibromatosis: Secondary | ICD-10-CM

## 2018-01-01 NOTE — Assessment & Plan Note (Signed)
New set of custom orthotics as above, I did size 12's and then trimmed them down, I think she would do well with a size 11. We did have the plane down the forefoot so that her toe box was not too tight.

## 2018-01-01 NOTE — Progress Notes (Signed)
    Patient was fitted for a : standard, cushioned, semi-rigid orthotic. The orthotic was heated and afterward the patient stood on the orthotic blank positioned on the orthotic stand. The patient was positioned in subtalar neutral position and 10 degrees of ankle dorsiflexion in a weight bearing stance. After completion of molding, a stable base was applied to the orthotic blank. The blank was ground to a stable position for weight bearing. Size: 12 Base: White EVA Additional Posting and Padding: None The patient ambulated these, and they were very comfortable.  I spent 40 minutes with this patient, greater than 50% was face-to-face time counseling regarding the below diagnosis.  ___________________________________________ Demaya Hardge J. Mesiah Manzo, M.D., ABFM., CAQSM. Primary Care and Sports Medicine [facility]  Adjunct Instructor of Family Medicine  [employer]   

## 2018-01-11 ENCOUNTER — Other Ambulatory Visit: Payer: Self-pay | Admitting: Family Medicine

## 2018-01-11 ENCOUNTER — Other Ambulatory Visit: Payer: Self-pay | Admitting: Sports Medicine

## 2018-01-11 DIAGNOSIS — M47816 Spondylosis without myelopathy or radiculopathy, lumbar region: Secondary | ICD-10-CM

## 2018-01-15 ENCOUNTER — Encounter: Payer: Self-pay | Admitting: Sports Medicine

## 2018-01-15 ENCOUNTER — Ambulatory Visit (INDEPENDENT_AMBULATORY_CARE_PROVIDER_SITE_OTHER): Payer: Medicare Other | Admitting: Sports Medicine

## 2018-01-15 DIAGNOSIS — M722 Plantar fascial fibromatosis: Secondary | ICD-10-CM | POA: Diagnosis not present

## 2018-01-15 NOTE — Progress Notes (Signed)
    Patient was fitted for a : standard, cushioned, semi-rigid orthotic. The orthotic was heated and afterward the patient stood on the orthotic blank positioned on the orthotic stand. The patient was positioned in subtalar neutral position and 10 degrees of ankle dorsiflexion in a weight bearing stance. After completion of molding, a stable base was applied to the orthotic blank. The blank was ground to a stable position for weight bearing. Size: 8 Base: [facility] Additional Posting and Padding: None The patient ambulated these, and they were very comfortable.  I spent 40 minutes with this patient, greater than 50% was face-to-face time counseling regarding the below diagnosis specifically use and anticipatory guidance regarding orthotics.  ___________________________________________ Ihor Austinhomas J. Benjamin Stainhekkekandam, M.D., ABFM., CAQSM. Primary Care and Sports Medicine [facility]  Adjunct Instructor of Family Medicine  [employer]

## 2018-01-15 NOTE — Assessment & Plan Note (Signed)
New set of custom orthotics as above. 

## 2018-01-21 ENCOUNTER — Other Ambulatory Visit: Payer: Self-pay | Admitting: Physical Medicine & Rehabilitation

## 2018-01-21 NOTE — Telephone Encounter (Signed)
Recieved electronic medication refill request for diclofenac gel for this patient, no mention in previous notes to use or continue to use this medication.  Please advise on usage.

## 2018-01-28 ENCOUNTER — Encounter: Payer: Medicare Other | Attending: Physical Medicine & Rehabilitation

## 2018-01-28 ENCOUNTER — Encounter: Payer: Self-pay | Admitting: Physical Medicine & Rehabilitation

## 2018-01-28 ENCOUNTER — Ambulatory Visit (HOSPITAL_BASED_OUTPATIENT_CLINIC_OR_DEPARTMENT_OTHER): Payer: Medicare Other | Admitting: Physical Medicine & Rehabilitation

## 2018-01-28 ENCOUNTER — Other Ambulatory Visit: Payer: Self-pay | Admitting: Sports Medicine

## 2018-01-28 VITALS — BP 139/76 | HR 75 | Resp 14 | Ht 63.0 in | Wt 112.0 lb

## 2018-01-28 DIAGNOSIS — M533 Sacrococcygeal disorders, not elsewhere classified: Secondary | ICD-10-CM | POA: Diagnosis not present

## 2018-01-28 DIAGNOSIS — M7918 Myalgia, other site: Secondary | ICD-10-CM | POA: Insufficient documentation

## 2018-01-28 DIAGNOSIS — G8929 Other chronic pain: Secondary | ICD-10-CM

## 2018-01-28 DIAGNOSIS — M47816 Spondylosis without myelopathy or radiculopathy, lumbar region: Secondary | ICD-10-CM | POA: Insufficient documentation

## 2018-01-28 NOTE — Progress Notes (Signed)
  PROCEDURE RECORD [facility]   Name: Kathleen [name] DOB:07-08-1950 MRN: [id number]  Date:01/28/2018  Physician: Claudette LawsAndrew Neel Buffone, MD    Nurse/CMA: Wessling, CMA  Allergies:  Allergies  Allergen Reactions  . Clindamycin/Lincomycin Nausea Only    Consent Signed: Yes.    Is patient diabetic? No.  CBG today?   Pregnant: No. LMP: No LMP recorded. Patient is postmenopausal. (age 65-55)  Anticoagulants: no Anti-inflammatory: no Antibiotics: no  Procedure: right L5, S1-3 radiofrequency  Position: Prone Start Time  3:15pm       End Time: 3:38pm  Fluoro Time: 30s  RN/CMA Wessling, CMA Wessling, CMA    Time 2:50pm 3:"56pm    BP 139/76 135/75    Pulse 75 69    Respirations 14 14    O2 Sat 98 97    S/S 6 6    Pain Level 5/10 1/10     D/C home with husband, patient A & O X 3, D/C instructions reviewed, and sits independently.      Sacroiliac radio frequency ablation under fluoroscopic guidance This consists of L4 medial branch, L5 dorsal ramus radio frequency ablation plus neuro lysis of S1-S2 -S3 dorsal rami  Indication is sacroiliac pain which has improved temporarily on 2 or more occasions by at least 50% following sacroiliac intra-articular injection and/or L5 dorsal ramus, S1,2,3 lateral branch blocks under fluoro guidance under fluoroscopic guidance. Pain interferes with self-care and mobility and has failed to respond to conservative measures.  Informed consent was obtained after discussing risks and benefits of the procedure with the patient these include bleeding bruising and infection temporary or permanent paralysis. The patient elects to proceed and has given written consent.  Patient placed prone on fluoroscopy table. Area marked and prepped with Betadine. Fluoroscopic images utilized to guide needle. 25-gauge 1.5 inch needle was used to anesthetize 5 injection points with 2 cc of 1% lidocaine each. Then a 20-gauge 10 cm RF  needle with a 10 mm curved active tip was inserted under fluoroscopic guidance first Harding the S1 SA P./sacral ala junction, bone contact made and confirmed with lateral imaging. Sensory stim at 50 Hz followed by motor stimulation at 2 Hz confirm proper needle location followed by injection of one cc of a solution containing 4 mg dexamethasone and 4 ML of 1% lidocaine MPF. Then the L5 SEP transverse process junction was targeted. Bone contact was made. Needle tip confirmed on lateral imaging. Sensory stimulation at 50 Hz followed by motor stimulation at 2 Hz confirm upper needle location followed by injection of one ML of the dexamethasone/lidocaine solution. Radio frequency 80C for 80 seconds was performed. Then the lateral aspect of the S1, S2 and S3 sacral foramina were targeted. Bone contact made. Sensory stim at 50 Hz followed by motor stim at 2 Hz confirm proper needle location. One ML of the dexamethasone/lidocaine solution was injected into each of 3 sites and radio frequency ablation 80C for 90 seconds was performed. Patient tolerated procedure well. Post procedure instructions given

## 2018-01-28 NOTE — Patient Instructions (Signed)

## 2018-02-06 NOTE — Telephone Encounter (Signed)
Urgent message from patient.

## 2018-03-14 ENCOUNTER — Other Ambulatory Visit: Payer: Self-pay | Admitting: Sports Medicine

## 2018-03-14 MED ORDER — MELOXICAM 15 MG PO TABS: 15.0000 mg | ORAL_TABLET | Freq: Every day | ORAL | 3 refills | Status: DC | PRN

## 2018-04-01 ENCOUNTER — Encounter: Payer: Self-pay | Admitting: Physical Medicine & Rehabilitation

## 2018-04-01 ENCOUNTER — Other Ambulatory Visit: Payer: Self-pay

## 2018-04-01 ENCOUNTER — Ambulatory Visit (HOSPITAL_BASED_OUTPATIENT_CLINIC_OR_DEPARTMENT_OTHER): Payer: Medicare Other | Admitting: Physical Medicine & Rehabilitation

## 2018-04-01 ENCOUNTER — Encounter: Payer: Medicare Other | Attending: Physical Medicine & Rehabilitation

## 2018-04-01 VITALS — BP 115/65 | HR 70 | Ht 63.0 in | Wt 115.8 lb

## 2018-04-01 DIAGNOSIS — M7918 Myalgia, other site: Secondary | ICD-10-CM | POA: Diagnosis present

## 2018-04-01 DIAGNOSIS — M47816 Spondylosis without myelopathy or radiculopathy, lumbar region: Secondary | ICD-10-CM | POA: Diagnosis not present

## 2018-04-01 DIAGNOSIS — G8929 Other chronic pain: Secondary | ICD-10-CM | POA: Insufficient documentation

## 2018-04-01 NOTE — Progress Notes (Signed)
Subjective:    Patient ID: Kathleen [name], female    DOB: [birth year], 68 y.o.   MRN: [id number]  HPI Right L5 RF, S1,2,3 lateral branch RF 12/2 Post procedural recovery 4-6 wks Has more facet pain on right side the patient feels like the pain is a little bit higher than the sacroiliac pain that she has experienced in the past. No falls or any new trauma. Pain Inventory Average Pain 6 Pain Right Now 1 My pain is intermittent, sharp, dull and aching  In the last 24 hours, has pain interfered with the following? General activity 5 Relation with others 4 Enjoyment of life 5 What TIME of day is your pain at its worst? morning Sleep (in general) Good  Pain is worse with: sitting, inactivity and standing Pain improves with: rest, heat/ice and therapy/exercise Relief from Meds: 5  Mobility walk without assistance how many minutes can you walk? 60 ability to climb steps?  yes do you drive?  yes  Function retired  Neuro/Psych No problems in this area  Prior Studies Any changes since last visit?  no  Physicians involved in your care Any changes since last visit?  no   No family history on file. Social History   Socioeconomic History  . Marital status: Married    Spouse name: Not on file  . Number of children: Not on file  . Years of education: Not on file  . Highest education level: Not on file  Occupational History  . Not on file  Social Needs  . Financial resource strain: Not on file  . Food insecurity:    Worry: Not on file    Inability: Not on file  . Transportation needs:    Medical: Not on file    Non-medical: Not on file  Tobacco Use  . Smoking status: Never Smoker  . Smokeless tobacco: Never Used  Substance and Sexual Activity  . Alcohol use: Not on file  . Drug use: Not on file  . Sexual activity: Not on file  Lifestyle  . Physical activity:    Days per week: Not on file    Minutes per session: Not on file  . Stress: Not on file    Relationships  . Social connections:    Talks on phone: Not on file    Gets together: Not on file    Attends religious service: Not on file    Active member of club or organization: Not on file    Attends meetings of clubs or organizations: Not on file    Relationship status: Not on file  Other Topics Concern  . Not on file  Social History Narrative  . Not on file   No past surgical history on file. No past medical history on file. BP 115/65   Pulse 70   Ht 5\' 3"  (1.6 m)   Wt 115 lb 12.8 oz (52.5 kg)   SpO2 96%   BMI 20.51 kg/m   Opioid Risk Score:   Fall Risk Score:  `1  Depression screen PHQ 2/9  Depression screen [facility] 2/9 04/01/2018 11/13/2017 09/11/2017 04/02/2017 10/26/2016  Decreased Interest 0 0 0 0 0  Down, Depressed, Hopeless 0 0 0 0 0  PHQ - 2 Score 0 0 0 0 0    Review of Systems  Constitutional: Negative.   HENT: Negative.   Eyes: Negative.   Respiratory: Negative.   Cardiovascular: Negative.   Gastrointestinal: Negative.   Endocrine: Negative.   Genitourinary: Negative.  Musculoskeletal: Negative.   Skin: Negative.   Allergic/Immunologic: Negative.   Neurological: Negative.   Hematological: Negative.   Psychiatric/Behavioral: Negative.   All other systems reviewed and are negative.      Objective:   Physical Exam Vitals signs and nursing note reviewed.  Constitutional:      Appearance: Normal appearance.  HENT:     Head: Normocephalic.     Nose: Nose normal.     Mouth/Throat:     Mouth: Mucous membranes are dry.  Neurological:     Mental Status: She is alert.    Patient has mild tenderness palpation at L4 and L5 paraspinals.  This is on the right side only.  She has normal spine range of motion however she has pain with right lateral bending.        Assessment & Plan:  1.  Chronic right-sided low back pain after postprocedural pain had subsided, the right sacroiliac pain has improved.  The patient is now more aware of the right facet  mediated pain. Would recommend right L3 right L4 medial branch blocks L5 dorsal ramus injection under fluoroscopic guidance. Discussed with patient agrees with plan We will see her back in 2 weeks.

## 2018-04-15 ENCOUNTER — Encounter: Payer: Self-pay | Admitting: Physical Medicine & Rehabilitation

## 2018-04-15 ENCOUNTER — Ambulatory Visit (HOSPITAL_BASED_OUTPATIENT_CLINIC_OR_DEPARTMENT_OTHER): Payer: Medicare Other | Admitting: Physical Medicine & Rehabilitation

## 2018-04-15 DIAGNOSIS — M47816 Spondylosis without myelopathy or radiculopathy, lumbar region: Secondary | ICD-10-CM | POA: Diagnosis not present

## 2018-04-15 NOTE — Patient Instructions (Signed)

## 2018-04-15 NOTE — Progress Notes (Signed)
Right lumbar L3, L4 medial branch blocks and L5 dorsal ramus injection under fluoroscopic guidance  Indication: Right Lumbar pain which is not relieved by medication management or other conservative care and interfering with self-care and mobility.  Informed consent was obtained after describing risks and benefits of the procedure with the patient, this includes bleeding, bruising, infection, paralysis and medication side effects. The patient wishes to proceed and has given written consent. The patient was placed in a prone position. The lumbar area was marked and prepped with Betadine. One ML of 1% lidocaine was injected into each of 3 areas into the skin and subcutaneous tissue. Then a 22-gauge 3.5 in spinal needle was inserted targeting the junction of the Right S1 superior articular process and sacral ala junction. Needle was advanced under fluoroscopic guidance. Bone contact was made.Isovue 200 was injected x0.5 mL demonstrating no intravascular uptake. Then a solution containing 2% MPF lidocaine was injected x0.5 mL. Then the Right L5 superior articular process in transverse process junction was targeted. Bone contact was made.Isovue 200 was injected x0.5 mL demonstrating no intravascular uptake. Then a solution containing 2% MPF lidocaine was injected x0.5 mL. Then the Right L4 superior articular process in transverse process junction was targeted. Bone contact was made. Isovue 200 was injected x0.5 mL demonstrating no intravascular uptake. Then a solution containing2% MPF lidocaine was injected x0.5 mL Patient tolerated procedure well. Post procedure instructions were given. Please refer to post procedure form. 

## 2018-04-15 NOTE — Progress Notes (Signed)
  PROCEDURE RECORD [facility]   Name: Kathleen [name] DOB:[birth year] MRN: [id number]  Date:04/15/2018  Physician: Claudette Laws, MD    Nurse/CMA: Nedra Hai, CMA  Allergies:  Allergies  Allergen Reactions  . Clindamycin/Lincomycin Nausea Only    Consent Signed: Yes.    Is patient diabetic? No.  CBG today?   Pregnant: No. LMP: No LMP recorded. Patient is postmenopausal. (age 66-55)  Anticoagulants: no Anti-inflammatory: no Antibiotics: no  Procedure: Right L3-5 Medial Branch Block Position: Prone Start Time: 3:36pm End Time:3:42pm Fluoro Time: 22  RN/CMA Nedra Hai, CMA Hermine Feria, CMA    Time 3:28pm 3:46pm    BP 144/80 125/79    Pulse 64 69    Respirations 16 16    O2 Sat 98 98    S/S 6/6 6/6    Pain Level 5/10 2/10     D/C home with husband, patient A & O X 3, D/C instructions reviewed, and sits independently.

## 2018-04-19 ENCOUNTER — Other Ambulatory Visit: Payer: Self-pay | Admitting: Physical Medicine & Rehabilitation

## 2018-04-22 ENCOUNTER — Other Ambulatory Visit: Payer: Self-pay | Admitting: *Deleted

## 2018-04-22 DIAGNOSIS — M47816 Spondylosis without myelopathy or radiculopathy, lumbar region: Secondary | ICD-10-CM

## 2018-04-22 MED ORDER — GABAPENTIN 100 MG PO CAPS: ORAL_CAPSULE | ORAL | 0 refills | Status: DC

## 2018-04-25 ENCOUNTER — Other Ambulatory Visit: Payer: Self-pay | Admitting: Sports Medicine

## 2018-04-25 DIAGNOSIS — M47816 Spondylosis without myelopathy or radiculopathy, lumbar region: Secondary | ICD-10-CM

## 2018-04-25 NOTE — Telephone Encounter (Signed)
Chart says PCP is Dr. Leonette Most?

## 2018-05-16 ENCOUNTER — Ambulatory Visit: Payer: Medicare Other | Admitting: Physical Medicine & Rehabilitation

## 2018-07-02 ENCOUNTER — Encounter: Payer: Medicare Other | Attending: Physical Medicine & Rehabilitation | Admitting: Physical Medicine & Rehabilitation

## 2018-07-02 ENCOUNTER — Encounter: Payer: Self-pay | Admitting: Physical Medicine & Rehabilitation

## 2018-07-02 ENCOUNTER — Other Ambulatory Visit: Payer: Self-pay

## 2018-07-02 DIAGNOSIS — M47816 Spondylosis without myelopathy or radiculopathy, lumbar region: Secondary | ICD-10-CM

## 2018-07-02 DIAGNOSIS — G8929 Other chronic pain: Secondary | ICD-10-CM | POA: Insufficient documentation

## 2018-07-02 DIAGNOSIS — M533 Sacrococcygeal disorders, not elsewhere classified: Secondary | ICD-10-CM | POA: Diagnosis not present

## 2018-07-02 DIAGNOSIS — M7918 Myalgia, other site: Secondary | ICD-10-CM | POA: Insufficient documentation

## 2018-07-02 NOTE — Progress Notes (Signed)
Subjective:  Consent for Web ex visit  Patient ID: Kathleen [name], female    DOB: [birth year], 68 y.o.   MRN: [id number] Duration of Web ex HPI   68 year old female with chronic right-sided low back pain.  She has had greater than 50% short-term relief with right sacroiliac injection under fluoroscopic guidance, intra-articular.  The patient also had greater than 50% relief with right L5 dorsal ramus right S1-S2-S3 lateral branch blocks. The patient then underwent radiofrequency ablation of the right sacroiliac joint in December 29th, 2019.  She had approximately 1 month of post procedure soreness.  She had improvement in the right-sided below the belt pain.  She then noted increased pain in the lumbar area on the right side above the belt.  She underwent right-sided L3-L4 medial branch and L5 dorsal ramus injection under fluoroscopic guidance on 04/15/2018. Preinjection 5/10 Post injection 2/10   Pain Inventory Average Pain 5 Pain Right Now 6 My pain is intermittent, sharp and dull  In the last 24 hours, has pain interfered with the following? General activity 5 Relation with others 5 Enjoyment of life 5 What TIME of day is your pain at its worst? morning and evening Sleep (in general) Good  Pain is worse with: some activites Pain improves with: therapy/exercise and stretching and walking Relief from Meds: 7  Mobility walk without assistance ability to climb steps?  yes do you drive?  yes  Function retired  Neuro/Psych tingling  Prior Studies Any changes since last visit?  no  Physicians involved in your care Any changes since last visit?  no   No family history on file. Social History   Socioeconomic History  . Marital status: Married    Spouse name: Not on file  . Number of children: Not on file  . Years of education: Not on file  . Highest education level: Not on file  Occupational History  . Not on file  Social Needs  . Financial resource strain:  Not on file  . Food insecurity:    Worry: Not on file    Inability: Not on file  . Transportation needs:    Medical: Not on file    Non-medical: Not on file  Tobacco Use  . Smoking status: Never Smoker  . Smokeless tobacco: Never Used  Substance and Sexual Activity  . Alcohol use: Not on file  . Drug use: Not on file  . Sexual activity: Not on file  Lifestyle  . Physical activity:    Days per week: Not on file    Minutes per session: Not on file  . Stress: Not on file  Relationships  . Social connections:    Talks on phone: Not on file    Gets together: Not on file    Attends religious service: Not on file    Active member of club or organization: Not on file    Attends meetings of clubs or organizations: Not on file    Relationship status: Not on file  Other Topics Concern  . Not on file  Social History Narrative  . Not on file   No past surgical history on file. No past medical history on file. There were no vitals taken for this visit.  Opioid Risk Score:   Fall Risk Score:  `1  Depression screen PHQ 2/9  Depression screen [facility] 2/9 07/02/2018 04/01/2018 11/13/2017 09/11/2017 04/02/2017 10/26/2016  Decreased Interest 0 0 0 0 0 0  Down, Depressed, Hopeless 0 0 0 0 0  0  PHQ - 2 Score 0 0 0 0 0 0   Review of Systems  Constitutional: Negative.   HENT: Negative.   Eyes: Negative.   Respiratory: Negative.   Cardiovascular: Negative.   Gastrointestinal: Negative.   Endocrine: Negative.   Genitourinary: Negative.   Musculoskeletal: Positive for back pain.  Skin: Negative.   Allergic/Immunologic: Negative.   Neurological:       Tingling  Hematological: Negative.   Psychiatric/Behavioral: Negative.   All other systems reviewed and are negative.      Objective:   Physical Exam Vitals signs and nursing note reviewed.  Constitutional:      Appearance: Normal appearance.  HENT:     Head: Normocephalic.     Mouth/Throat:     Mouth: Mucous membranes are moist.   Eyes:     Extraocular Movements: Extraocular movements intact.     Conjunctiva/sclera: Conjunctivae normal.     Pupils: Pupils are equal, round, and reactive to light.  Neurological:     Mental Status: She is alert and oriented to person, place, and time.  Psychiatric:        Mood and Affect: Mood normal.        Behavior: Behavior normal.        Thought Content: Thought content normal.        Judgment: Judgment normal.    Remainder of  examination was deferred secondary to WebEx visit.       Assessment & Plan:  1.  Chronic right-sided low back and buttock pain.  Good relief with right sacroiliac intra-articular injection as well as L5 dorsal ramus S1 is to S3 lateral branch blocks.  The patient also had relief with sacroiliac ablation of the same nerves however duration of response was only 2 to 3 months.  We discussed that she may be a good candidate for coolief, cooled radiofrequency to allow for larger lesion size.  We discussed that this clinic does not possess this equipment. Pt will do further research on this, would be happy to place referral if needed F/u prn

## 2018-07-22 ENCOUNTER — Other Ambulatory Visit: Payer: Self-pay | Admitting: Sports Medicine

## 2018-07-22 DIAGNOSIS — M47816 Spondylosis without myelopathy or radiculopathy, lumbar region: Secondary | ICD-10-CM

## 2018-07-25 ENCOUNTER — Telehealth: Payer: Self-pay | Admitting: Sports Medicine

## 2018-07-25 DIAGNOSIS — M47816 Spondylosis without myelopathy or radiculopathy, lumbar region: Secondary | ICD-10-CM

## 2018-07-25 MED ORDER — GABAPENTIN 100 MG PO CAPS: ORAL_CAPSULE | ORAL | 0 refills | Status: DC

## 2018-07-25 NOTE — Telephone Encounter (Signed)
Called patient and let her know Dr. Karie Schwalbe has sent a short term supply of the Gabapentin and will need a follow up as Doxemity or Telephone within a week. Please call patient to schedule.

## 2018-07-25 NOTE — Telephone Encounter (Signed)
Patient called and is out of the 100 mg Gabapentin and wants to know if you could send a refill to [facility] in [place]. Last OV was 12/2017. Please advise.

## 2018-07-25 NOTE — Telephone Encounter (Signed)
I will send a short course, we need a virtual visit.

## 2018-07-25 NOTE — Telephone Encounter (Signed)
I called pt and left her a message to call the office and schedule a virtual F/U with Dr.Thekkekandam for next week

## 2018-07-30 ENCOUNTER — Other Ambulatory Visit: Payer: Self-pay | Admitting: Sports Medicine

## 2018-07-30 DIAGNOSIS — M47816 Spondylosis without myelopathy or radiculopathy, lumbar region: Secondary | ICD-10-CM

## 2018-07-30 MED ORDER — DULOXETINE HCL 30 MG PO CPEP: ORAL_CAPSULE | ORAL | 0 refills | Status: DC

## 2018-07-31 ENCOUNTER — Encounter: Payer: Self-pay | Admitting: Sports Medicine

## 2018-07-31 ENCOUNTER — Ambulatory Visit (INDEPENDENT_AMBULATORY_CARE_PROVIDER_SITE_OTHER): Payer: Medicare Other | Admitting: Sports Medicine

## 2018-07-31 DIAGNOSIS — M47816 Spondylosis without myelopathy or radiculopathy, lumbar region: Secondary | ICD-10-CM

## 2018-07-31 MED ORDER — GABAPENTIN 100 MG PO CAPS: ORAL_CAPSULE | ORAL | 3 refills | Status: DC

## 2018-07-31 NOTE — Progress Notes (Signed)
Virtual Visit via WebEx/MyChart   I connected with  Kathleen [name]  on 07/31/18 via WebEx/MyChart/Doximity Video and verified that I am speaking with the correct person using two identifiers.   I discussed the limitations, risks, security and privacy concerns of performing an evaluation and management service by WebEx/MyChart/Doximity Video, including the higher likelihood of inaccurate diagnosis and treatment, and the availability of in person appointments.  We also discussed the likely need of an additional face to face encounter for complete and high quality delivery of care.  I also discussed with the patient that there may be a patient responsible charge related to this service. The patient expressed understanding and wishes to proceed.  Provider location is either at home or medical facility. Patient location is at their home, different from provider location. People involved in care of the patient during this telehealth encounter were myself, my nurse/medical assistant, and my front office/scheduling team member.  Subjective:    CC: Follow-up  HPI: I have not seen Kathleen [name] in some time, she has low back pain, traced to the SI joints, currently undergoing interventional treatment with pain management.  She does need refills on duloxetine, gabapentin.  Otherwise happy with how things are going.  I reviewed the past medical history, family history, social history, surgical history, and allergies today and no changes were needed.  Please see the problem list section below in epic for further details.  Past Medical History: No past medical history on file. Past Surgical History: No past surgical history on file. Social History: Social History   Socioeconomic History  . Marital status: Married    Spouse name: Not on file  . Number of children: Not on file  . Years of education: Not on file  . Highest education level: Not on file  Occupational History  . Not on file  Social Needs   . Financial resource strain: Not on file  . Food insecurity:    Worry: Not on file    Inability: Not on file  . Transportation needs:    Medical: Not on file    Non-medical: Not on file  Tobacco Use  . Smoking status: Never Smoker  . Smokeless tobacco: Never Used  Substance and Sexual Activity  . Alcohol use: Not on file  . Drug use: Not on file  . Sexual activity: Not on file  Lifestyle  . Physical activity:    Days per week: Not on file    Minutes per session: Not on file  . Stress: Not on file  Relationships  . Social connections:    Talks on phone: Not on file    Gets together: Not on file    Attends religious service: Not on file    Active member of club or organization: Not on file    Attends meetings of clubs or organizations: Not on file    Relationship status: Not on file  Other Topics Concern  . Not on file  Social History Narrative  . Not on file   Family History: No family history on file. Allergies: Allergies  Allergen Reactions  . Clindamycin/Lincomycin Nausea Only   Medications: See med rec.  Review of Systems: No fevers, chills, night sweats, weight loss, chest pain, or shortness of breath.   Objective:    General: Speaking full sentences, no audible heavy breathing.  Sounds alert and appropriately interactive.  Appears well.  Face symmetric.  Extraocular movements intact.  Pupils equal and round.  No nasal flaring or accessory  muscle use visualized.  No other physical exam performed due to the non-physical nature of this visit.  Impression and Recommendations:    Multifactorial low back pain Notnamed is now working with Dr. Wynn Banker with SI joint radiofrequency ablation, they are discussing cryoablation with another provider. I am happy to refill her gabapentin, duloxetine as needed. Return to see me as needed.  I discussed the above assessment and treatment plan with the patient. The patient was provided an opportunity to ask questions and all  were answered. The patient agreed with the plan and demonstrated an understanding of the instructions.   The patient was advised to call back or seek an in-person evaluation if the symptoms worsen or if the condition fails to improve as anticipated.   I provided 25 minutes of non-face-to-face time during this encounter, 15 minutes of additional time was needed to gather information, review chart, records, communicate/coordinate with staff remotely, troubleshooting the multiple errors that we get every time when trying to do video calls through the electronic medical record, WebEx, and Doximity, restart the encounter multiple times due to instability of the software, as well as complete documentation.   ___________________________________________ Kathleen [name]. Benjamin Stain, M.D., ABFM., CAQSM. Primary Care and Sports Medicine [facility]  Adjunct Professor of Family Medicine  [employer]

## 2018-07-31 NOTE — Assessment & Plan Note (Signed)
Kathleen [name] is now working with Dr. Wynn Banker with SI joint radiofrequency ablation, they are discussing cryoablation with another provider. I am happy to refill her gabapentin, duloxetine as needed. Return to see me as needed.

## 2018-09-16 ENCOUNTER — Ambulatory Visit: Payer: Medicare Other | Admitting: Physical Medicine & Rehabilitation

## 2018-09-20 ENCOUNTER — Encounter: Payer: Medicare Other | Attending: Physical Medicine & Rehabilitation | Admitting: Physical Medicine & Rehabilitation

## 2018-09-20 ENCOUNTER — Other Ambulatory Visit: Payer: Self-pay

## 2018-09-20 ENCOUNTER — Encounter: Payer: Self-pay | Admitting: Physical Medicine & Rehabilitation

## 2018-09-20 VITALS — BP 129/78 | HR 65 | Temp 97.7°F | Ht 63.0 in | Wt 114.6 lb

## 2018-09-20 DIAGNOSIS — M7918 Myalgia, other site: Secondary | ICD-10-CM | POA: Diagnosis present

## 2018-09-20 DIAGNOSIS — G8929 Other chronic pain: Secondary | ICD-10-CM | POA: Diagnosis not present

## 2018-09-20 DIAGNOSIS — M47816 Spondylosis without myelopathy or radiculopathy, lumbar region: Secondary | ICD-10-CM | POA: Diagnosis not present

## 2018-09-20 DIAGNOSIS — M533 Sacrococcygeal disorders, not elsewhere classified: Secondary | ICD-10-CM

## 2018-09-20 NOTE — Patient Instructions (Signed)
Please call if you would like to schedule a repeat Right Sacroiliac RF

## 2018-09-20 NOTE — Progress Notes (Signed)
Subjective:    Patient ID: Kathleen [name], female    DOB: 06/11/1950, 68 y.o.   MRN: [id number]  HPI  68 year old female who is referred by sports medicine for the evaluation of right sided low back and buttock pain.  She has had good relief on a temporary basis with sacroiliac injections under fluoroscopic guidance.  She subsequently underwent nerve blocks to the sacroiliac i.e. L5 dorsal ramus S1-S2-S3 lateral branch blocks with good short-term pain relief followed by radiofrequency ablation of the right sacroiliac on February 24, 2018 Below the belt pain has been increased over the last month.  After her sacroiliac pain subsided she started noticing right-sided lumbar pain  She underwent right-sided L3-L4 medial branch and L5 dorsal ramus injection under fluoroscopic guidance on 04/15/2018. Preinjection 5/10 Post injection 2/10  This pain has subsided and has not recurred  The patient is asking about piriformis injections Dry needling to piriformis slighlly help with pain but the therapist did not think it was worth continuing.  We discussed that trigger point injection of the piriformis muscle can give some short-term pain relief, other options including botulinum toxin injections of piriformis nerve have variable results and are not covered by insurance. Pain Inventory Average Pain 6 Pain Right Now 4 My pain is sharp, dull, stabbing and aching  In the last 24 hours, has pain interfered with the following? General activity 5 Relation with others 5 Enjoyment of life 5 What TIME of day is your pain at its worst? morning and evening Sleep (in general) Fair  Pain is worse with: bending and inactivity Pain improves with: rest and therapy/exercise Relief from Meds: 5  Mobility walk without assistance ability to climb steps?  yes do you drive?  yes  Function retired  Neuro/Psych No problems in this area  Prior Studies Any changes since last visit?  no  Physicians  involved in your care Any changes since last visit?  no   No family history on file. Social History   Socioeconomic History  . Marital status: Married    Spouse name: Not on file  . Number of children: Not on file  . Years of education: Not on file  . Highest education level: Not on file  Occupational History  . Not on file  Social Needs  . Financial resource strain: Not on file  . Food insecurity    Worry: Not on file    Inability: Not on file  . Transportation needs    Medical: Not on file    Non-medical: Not on file  Tobacco Use  . Smoking status: Never Smoker  . Smokeless tobacco: Never Used  Substance and Sexual Activity  . Alcohol use: Not on file  . Drug use: Not on file  . Sexual activity: Not on file  Lifestyle  . Physical activity    Days per week: Not on file    Minutes per session: Not on file  . Stress: Not on file  Relationships  . Social Herbalist on phone: Not on file    Gets together: Not on file    Attends religious service: Not on file    Active member of club or organization: Not on file    Attends meetings of clubs or organizations: Not on file    Relationship status: Not on file  Other Topics Concern  . Not on file  Social History Narrative  . Not on file   No past surgical history on file. No  past medical history on file. BP 129/78   Pulse 65   Temp 97.7 F (36.5 C)   Ht 5\' 3"  (1.6 m)   Wt 114 lb 9.6 oz (52 kg)   SpO2 97%   BMI 20.30 kg/m   Opioid Risk Score:   Fall Risk Score:  `1  Depression screen PHQ 2/9  [facility] 07/02/2018 04/01/2018 11/13/2017 09/11/2017 04/02/2017 10/26/2016  Decreased Interest 0 0 0 0 0 0  Down, Depressed, Hopeless 0 0 0 0 0 0  PHQ - 2 Score 0 0 0 0 0 0     Review of Systems  Constitutional: Negative.   HENT: Negative.   Eyes: Negative.   Respiratory: Negative.   Cardiovascular: Negative.   Gastrointestinal: Negative.   Endocrine: Negative.   Genitourinary: Negative.    Musculoskeletal: Negative.   Skin: Negative.   Allergic/Immunologic: Negative.   Neurological: Negative.   Hematological: Negative.   Psychiatric/Behavioral: Negative.   All other systems reviewed and are negative.      Objective:   Physical Exam Vitals signs and nursing note reviewed.  Constitutional:      Appearance: Normal appearance.  HENT:     Head: Normocephalic and atraumatic.  Eyes:     Extraocular Movements: Extraocular movements intact.     Conjunctiva/sclera: Conjunctivae normal.     Pupils: Pupils are equal, round, and reactive to light.  Musculoskeletal:     Right hip: She exhibits decreased range of motion.     Left hip: She exhibits normal range of motion.     Lumbar back: She exhibits tenderness. She exhibits normal range of motion and no deformity.     Comments: Negative Faber's on the left side Positive Faber's on the right side at the sacroiliac area, there is also tightness in the hip adductors  Negative straight leg raising  Skin:    General: Skin is warm and dry.  Neurological:     Mental Status: She is alert.     Comments: Motor strength is 5/5 bilateral hip flexor knee extensor ankle dorsiflexor. Gait without evidence of toe drag or knee instability           Assessment & Plan:  1.  Right sacroiliac pain she has good documented response to sacroiliac radiofrequency procedure, i.e. L5 dorsal ramus S1-S2-S3 lateral branch neurotomies.  She had a typical 6838-month relief of symptoms.  We discussed that cooled radiofrequency may give her a longer duration of response although it is difficult to estimate the exact duration.  We discussed that this clinic does not have the cooled radiofrequency equipment but 1 of her former physicians does have this in the office.  [facility] and ask whether she would have to go through sacroiliac nerve blocks prior to repeating a sacroiliac radiofrequency procedure using the  cooled RF.  Patient states she will call back if she elects to repeat the standard RF for the right L5 dorsal ramus, right S1-S2-S3 lateral branch radiofrequency neurotomy  2.  Right lumbar spondylosis which has responded for prolonged period of time with right L3-L4 medial branch and right L5 blocks.  At this point she does not need to repeat these.

## 2018-09-26 LAB — HM COLONOSCOPY

## 2018-10-08 ENCOUNTER — Other Ambulatory Visit (HOSPITAL_COMMUNITY): Payer: Self-pay | Admitting: *Deleted

## 2018-10-09 ENCOUNTER — Other Ambulatory Visit: Payer: Self-pay

## 2018-10-09 ENCOUNTER — Ambulatory Visit (HOSPITAL_COMMUNITY)
Admission: RE | Admit: 2018-10-09 | Discharge: 2018-10-09 | Disposition: A | Discharge status: Home / Self Care | Payer: Medicare Other | Source: Ambulatory Visit | Attending: Obstetrics and Gynecology | Admitting: Obstetrics and Gynecology

## 2018-10-09 DIAGNOSIS — M810 Age-related osteoporosis without current pathological fracture: Secondary | ICD-10-CM | POA: Diagnosis present

## 2018-10-09 MED ORDER — ZOLEDRONIC ACID 5 MG/100ML IV SOLN: 5.0000 mg | Freq: Once | INTRAVENOUS | Status: DC

## 2018-10-09 MED ORDER — ZOLEDRONIC ACID 5 MG/100ML IV SOLN
INTRAVENOUS | Status: AC
  Administered 2018-10-09: 5 mg
  Filled 2018-10-09: qty 100

## 2018-10-09 NOTE — Discharge Instructions (Signed)

## 2018-10-31 ENCOUNTER — Other Ambulatory Visit: Payer: Self-pay | Admitting: Sports Medicine

## 2018-10-31 ENCOUNTER — Encounter: Payer: Self-pay | Admitting: Sports Medicine

## 2018-10-31 DIAGNOSIS — M47816 Spondylosis without myelopathy or radiculopathy, lumbar region: Secondary | ICD-10-CM

## 2018-10-31 MED ORDER — DULOXETINE HCL 30 MG PO CPEP: ORAL_CAPSULE | ORAL | 0 refills | Status: DC

## 2018-11-27 ENCOUNTER — Encounter: Payer: Self-pay | Admitting: Sports Medicine

## 2018-11-27 DIAGNOSIS — M47816 Spondylosis without myelopathy or radiculopathy, lumbar region: Secondary | ICD-10-CM

## 2018-11-27 MED ORDER — GABAPENTIN 100 MG PO CAPS: ORAL_CAPSULE | ORAL | 3 refills | Status: DC

## 2018-11-29 DIAGNOSIS — M810 Age-related osteoporosis without current pathological fracture: Secondary | ICD-10-CM | POA: Insufficient documentation

## 2019-01-28 ENCOUNTER — Other Ambulatory Visit: Payer: Self-pay | Admitting: *Deleted

## 2019-01-28 DIAGNOSIS — M47816 Spondylosis without myelopathy or radiculopathy, lumbar region: Secondary | ICD-10-CM

## 2019-01-28 MED ORDER — DULOXETINE HCL 30 MG PO CPEP: ORAL_CAPSULE | ORAL | 0 refills | Status: DC

## 2019-03-07 ENCOUNTER — Other Ambulatory Visit: Payer: Self-pay | Admitting: Sports Medicine

## 2019-03-07 MED ORDER — MELOXICAM 15 MG PO TABS: 15.0000 mg | ORAL_TABLET | Freq: Every day | ORAL | 3 refills | Status: DC | PRN

## 2019-03-24 ENCOUNTER — Other Ambulatory Visit: Payer: Self-pay | Admitting: Sports Medicine

## 2019-03-24 DIAGNOSIS — M47816 Spondylosis without myelopathy or radiculopathy, lumbar region: Secondary | ICD-10-CM

## 2019-03-24 MED ORDER — GABAPENTIN 100 MG PO CAPS: ORAL_CAPSULE | ORAL | 3 refills | Status: DC

## 2019-03-28 ENCOUNTER — Encounter: Payer: Self-pay | Admitting: Physical Medicine & Rehabilitation

## 2019-03-28 ENCOUNTER — Other Ambulatory Visit: Payer: Self-pay

## 2019-03-28 ENCOUNTER — Encounter: Payer: Medicare Other | Attending: Physical Medicine & Rehabilitation | Admitting: Physical Medicine & Rehabilitation

## 2019-03-28 VITALS — BP 135/78 | HR 70 | Temp 97.7°F | Wt 111.0 lb

## 2019-03-28 DIAGNOSIS — M533 Sacrococcygeal disorders, not elsewhere classified: Secondary | ICD-10-CM | POA: Diagnosis present

## 2019-03-28 DIAGNOSIS — G8929 Other chronic pain: Secondary | ICD-10-CM | POA: Diagnosis present

## 2019-03-28 NOTE — Patient Instructions (Addendum)
You had a radio frequency procedure today This was done to alleviate joint pain in your lumbar area We injected lidocaine which is a local anesthetic.  You may experience soreness at the injection sites. You may also experienced some irritation of the nerves that were heated I'm recommending ice for 30 minutes every 2 hours as needed for the next 24-48 hours   

## 2019-03-28 NOTE — Progress Notes (Signed)
  PROCEDURE RECORD [facility]   Name: Maxie Debose DOB:[birth year] MRN: [id number]  Date:03/28/2019  Physician: Claudette Laws, MD    Nurse/CMA: Jaysen Wey, CMA  Allergies:  Allergies  Allergen Reactions  . Clindamycin/Lincomycin Nausea Only    Consent Signed: Yes.    Is patient diabetic? No.  CBG today?   Pregnant: No. LMP: No LMP recorded. Patient is postmenopausal. (age 69-55)  Anticoagulants: no Anti-inflammatory: no Antibiotics: no  Procedure: right L5 dorsal ramus, S1-3 lateral branch radiofrequency  Position: Prone Start Time: 11:47am  End Time: 12:05pm  Fluoro Time: 43s  RN/CMA Damali Broadfoot, CMA Aedin Jeansonne, CMA    Time 11:30am 12:15am    BP 135/78 154/79    Pulse 70 67    Respirations 14 14    O2 Sat 98 96    S/S 6 6    Pain Level 4/10      D/C home with husband, patient A & O X 3, D/C instructions reviewed, and sits independently.

## 2019-03-28 NOTE — Progress Notes (Signed)
RIght Sacroiliac radio frequency ablation under fluoroscopic guidance This consists of L5 dorsal ramus radio frequency ablation plus neuro lysis of S1-S2 -S3 lateral branches  Indication is sacroiliac pain which has improved temporarily onby at least 50% following sacroiliac intra-articular injection and L5 , S1,2,3 Lateral branch blocks under fluoroscopic guidance. Pain interferes with self-care and mobility and has failed to respond to conservative measures.  Informed consent was obtained after discussing risks and benefits of the procedure with the patient these include bleeding bruising and infection temporary or permanent paralysis. The patient elects to proceed and has given written consent.  Patient placed prone on fluoroscopy table. Area marked and prepped with Betadine. Fluoroscopic images utilized to guide needle. 25-gauge 1.5 inch needle was used to anesthetize 4 injection points with 2 cc of 1% lidocaine each. Then a 18-gauge 10 cm RF needle with a 10 mm curved active tip was inserted under fluoroscopic guidance first targeting the S1 SA P./sacral ala junction, bone contact made and confirmed with lateral imaging.  motor stimulation at 2 Hz confirm proper needle location followed by injection of one cc of a solution containing   1% lidocaine MPF. Radio frequency 80C for 80 seconds was performed. Then the inferolateral aspect of the S1, S2 and lateral aspect of S3 sacral foramina were targeted. Bone contact made.  motor stim at 2 Hz confirm proper needle location. One ML of the /lidocaine solution was injected into each of 3 sites and radio frequency ablation 80C for 90 seconds was performed. Patient tolerated procedure well. Post procedure instructions given

## 2019-04-25 ENCOUNTER — Encounter: Payer: Medicare Other | Attending: Physical Medicine & Rehabilitation | Admitting: Physical Medicine & Rehabilitation

## 2019-04-25 ENCOUNTER — Encounter: Payer: Self-pay | Admitting: Physical Medicine & Rehabilitation

## 2019-04-25 ENCOUNTER — Other Ambulatory Visit: Payer: Self-pay | Admitting: Sports Medicine

## 2019-04-25 ENCOUNTER — Other Ambulatory Visit: Payer: Self-pay

## 2019-04-25 VITALS — BP 146/79 | HR 66 | Temp 97.5°F | Ht 63.0 in | Wt 112.0 lb

## 2019-04-25 DIAGNOSIS — G8929 Other chronic pain: Secondary | ICD-10-CM | POA: Insufficient documentation

## 2019-04-25 DIAGNOSIS — M533 Sacrococcygeal disorders, not elsewhere classified: Secondary | ICD-10-CM | POA: Diagnosis not present

## 2019-04-25 DIAGNOSIS — M47816 Spondylosis without myelopathy or radiculopathy, lumbar region: Secondary | ICD-10-CM

## 2019-04-25 MED ORDER — DULOXETINE HCL 30 MG PO CPEP: ORAL_CAPSULE | ORAL | 1 refills | Status: DC

## 2019-04-25 NOTE — Patient Instructions (Signed)
Please call if Right lower extremity pain increases

## 2019-04-25 NOTE — Progress Notes (Signed)
Subjective:    Patient ID: Kathleen [name], female    DOB: [birth year], 69 y.o.   MRN: [id number]  HPI 69 year old female with history of lumbar spondylosis as well as sacroiliac pain affecting primarily the right side. She underwent right L5 dorsal ramus right S1 is to S3 lateral branch blocks as well as radiofrequency neurotomy on 03/28/2019.  She has experienced good relief of her usual pain.  She had some hypersensitivity over the area that was injected for a couple weeks but this has subsided.  She has been able to ambulate on uneven surfaces without aggravating her pain  Pain Inventory Average Pain 3 Pain Right Now 3 My pain is intermittent, stabbing and tingling  In the last 24 hours, has pain interfered with the following? General activity 1 Relation with others 1 Enjoyment of life 1 What TIME of day is your pain at its worst? morning, evening Sleep (in general) Good  Pain is worse with: bending, sitting, inactivity and standing Pain improves with: rest, heat/ice, therapy/exercise and medication Relief from Meds: 6  Mobility walk without assistance how many minutes can you walk? 120 ability to climb steps?  yes do you drive?  yes Do you have any goals in this area?  yes  Function retired  Neuro/Psych No problems in this area  Prior Studies Any changes since last visit?  no  Physicians involved in your care Any changes since last visit?  no   History reviewed. No pertinent family history. Social History   Socioeconomic History  . Marital status: Married    Spouse name: Not on file  . Number of children: Not on file  . Years of education: Not on file  . Highest education level: Not on file  Occupational History  . Not on file  Tobacco Use  . Smoking status: Never Smoker  . Smokeless tobacco: Never Used  Substance and Sexual Activity  . Alcohol use: Not on file  . Drug use: Not on file  . Sexual activity: Not on file  Other Topics Concern  . Not on  file  Social History Narrative  . Not on file   Social Determinants of Health   Financial Resource Strain:   . Difficulty of Paying Living Expenses: Not on file  Food Insecurity:   . Worried About Programme researcher, broadcasting/film/video in the Last Year: Not on file  . Ran Out of Food in the Last Year: Not on file  Transportation Needs:   . Lack of Transportation (Medical): Not on file  . Lack of Transportation (Non-Medical): Not on file  Physical Activity:   . Days of Exercise per Week: Not on file  . Minutes of Exercise per Session: Not on file  Stress:   . Feeling of Stress : Not on file  Social Connections:   . Frequency of Communication with Friends and Family: Not on file  . Frequency of Social Gatherings with Friends and Family: Not on file  . Attends Religious Services: Not on file  . Active Member of Clubs or Organizations: Not on file  . Attends Banker Meetings: Not on file  . Marital Status: Not on file   History reviewed. No pertinent surgical history. History reviewed. No pertinent past medical history. BP (!) 146/79   Pulse 66   Temp (!) 97.5 F (36.4 C)   Ht 5\' 3"  (1.6 m)   Wt 112 lb (50.8 kg)   SpO2 97%   BMI 19.84 kg/m   Opioid  Risk Score:   Fall Risk Score:  `1  Depression screen PHQ 2/9  Depression screen [facility] 2/9 07/02/2018 04/01/2018 11/13/2017 09/11/2017 04/02/2017 10/26/2016  Decreased Interest 0 0 0 0 0 0  Down, Depressed, Hopeless 0 0 0 0 0 0  PHQ - 2 Score 0 0 0 0 0 0    Review of Systems  Constitutional: Negative.   HENT: Negative.   Eyes: Negative.   Respiratory: Negative.   Cardiovascular: Negative.   Gastrointestinal: Negative.   Endocrine: Negative.   Genitourinary: Negative.   Musculoskeletal: Negative.   Skin: Negative.   Neurological: Negative.   Hematological: Negative.   Psychiatric/Behavioral: Negative.   All other systems reviewed and are negative.      Objective:   Physical Exam Vitals and nursing note reviewed.   Constitutional:      Appearance: Normal appearance.  Eyes:     Extraocular Movements: Extraocular movements intact.     Conjunctiva/sclera: Conjunctivae normal.     Pupils: Pupils are equal, round, and reactive to light.  Musculoskeletal:     Comments: Mild tenderness right PSIS area no tenderness over the gluteus area no tenderness over the lumbar paraspinal.  She has good lumbar flexion as well as lumbar extension and lateral bending bilaterally.  Neurological:     General: No focal deficit present.     Mental Status: She is alert and oriented to person, place, and time.  Psychiatric:        Mood and Affect: Mood normal.        Behavior: Behavior normal.        Thought Content: Thought content normal.           Assessment & Plan:  #1.  Right sacroiliac pain improved after radiofrequency neurotomy right L5 dorsal ramus right S1-S2-S3 lateral branches.  As discussed with the patient difficult to predict the duration of the response to the procedure.  We will see her back in approximately 4 more months.  At that time if she is having recurrence of pain we could schedule for a repeat procedure the following month.  We will continue with her home exercise program as well as her physical therapy visits

## 2019-07-23 ENCOUNTER — Other Ambulatory Visit: Payer: Self-pay | Admitting: Sports Medicine

## 2019-07-23 DIAGNOSIS — M47816 Spondylosis without myelopathy or radiculopathy, lumbar region: Secondary | ICD-10-CM

## 2019-07-23 MED ORDER — GABAPENTIN 100 MG PO CAPS: ORAL_CAPSULE | ORAL | 3 refills | Status: DC

## 2019-07-26 ENCOUNTER — Other Ambulatory Visit: Payer: Self-pay | Admitting: Sports Medicine

## 2019-07-26 DIAGNOSIS — M47816 Spondylosis without myelopathy or radiculopathy, lumbar region: Secondary | ICD-10-CM

## 2019-07-26 MED ORDER — GABAPENTIN 600 MG PO TABS: ORAL_TABLET | ORAL | 3 refills | Status: DC

## 2019-08-19 ENCOUNTER — Ambulatory Visit: Payer: Medicare Other | Admitting: Physical Medicine & Rehabilitation

## 2019-08-22 ENCOUNTER — Encounter: Payer: Medicare Other | Attending: Physical Medicine & Rehabilitation | Admitting: Physical Medicine & Rehabilitation

## 2019-08-22 ENCOUNTER — Encounter: Payer: Self-pay | Admitting: Physical Medicine & Rehabilitation

## 2019-08-22 ENCOUNTER — Other Ambulatory Visit: Payer: Self-pay

## 2019-08-22 VITALS — BP 125/75 | HR 63 | Temp 97.3°F | Ht 63.0 in | Wt 113.0 lb

## 2019-08-22 DIAGNOSIS — G8929 Other chronic pain: Secondary | ICD-10-CM | POA: Insufficient documentation

## 2019-08-22 DIAGNOSIS — M533 Sacrococcygeal disorders, not elsewhere classified: Secondary | ICD-10-CM | POA: Insufficient documentation

## 2019-08-22 NOTE — Patient Instructions (Signed)
Please make appt for radiofrequency repeat in August

## 2019-08-22 NOTE — Progress Notes (Signed)
Subjective:    Patient ID: Kathleen [name], female    DOB: 1950-09-21, 69 y.o.   MRN: [id number]  HPI   RIght sacroiliac RF neurotomy helped until ~84mo ago, gradually wearing off, worse in last 5 days.  Pain does not interfere with sleep   Still goes to PT every 2 wks, work on IT band and manual therapy for back  Pain Inventory Average Pain 6 Pain Right Now 7 My pain is constant, sharp and tingling  In the last 24 hours, has pain interfered with the following? General activity 5 Relation with others 5 Enjoyment of life 4 What TIME of day is your pain at its worst? morning, evening Sleep (in general) Good  Pain is worse with: sitting, inactivity and standing Pain improves with: rest, heat/ice, therapy/exercise and injections Relief from Meds: 5  Mobility walk without assistance  Function retired  Neuro/Psych No problems in this area  Prior Studies Any changes since last visit?  no  Physicians involved in your care Any changes since last visit?  no   History reviewed. No pertinent family history. Social History   Socioeconomic History  . Marital status: Married    Spouse name: Not on file  . Number of children: Not on file  . Years of education: Not on file  . Highest education level: Not on file  Occupational History  . Not on file  Tobacco Use  . Smoking status: Never Smoker  . Smokeless tobacco: Never Used  Substance and Sexual Activity  . Alcohol use: Not on file  . Drug use: Not on file  . Sexual activity: Not on file  Other Topics Concern  . Not on file  Social History Narrative  . Not on file   Social Determinants of Health   Financial Resource Strain:   . Difficulty of Paying Living Expenses:   Food Insecurity:   . Worried About Programme researcher, broadcasting/film/video in the Last Year:   . Barista in the Last Year:   Transportation Needs:   . Freight forwarder (Medical):   Marland Kitchen Lack of Transportation (Non-Medical):   Physical Activity:   . Days  of Exercise per Week:   . Minutes of Exercise per Session:   Stress:   . Feeling of Stress :   Social Connections:   . Frequency of Communication with Friends and Family:   . Frequency of Social Gatherings with Friends and Family:   . Attends Religious Services:   . Active Member of Clubs or Organizations:   . Attends Banker Meetings:   Marland Kitchen Marital Status:    History reviewed. No pertinent surgical history. History reviewed. No pertinent past medical history. BP 125/75   Pulse 63   Temp (!) 97.3 F (36.3 C)   Ht 5\' 3"  (1.6 m)   Wt 113 lb (51.3 kg)   SpO2 98%   BMI 20.02 kg/m   Opioid Risk Score:   Fall Risk Score:  `1  Depression screen PHQ 2/9  Depression screen [facility] 2/9 07/02/2018 04/01/2018 11/13/2017 09/11/2017 04/02/2017 10/26/2016  Decreased Interest 0 0 0 0 0 0  Down, Depressed, Hopeless 0 0 0 0 0 0  PHQ - 2 Score 0 0 0 0 0 0    Review of Systems  Constitutional: Negative.   HENT: Negative.   Eyes: Negative.   Respiratory: Negative.   Cardiovascular: Negative.   Gastrointestinal: Negative.   Endocrine: Negative.   Genitourinary: Negative.   Musculoskeletal: Positive  for back pain.  Skin: Negative.   Allergic/Immunologic: Negative.   Neurological: Negative.   Hematological: Negative.   Psychiatric/Behavioral: Negative.   All other systems reviewed and are negative.      Objective:   Physical Exam Vitals and nursing note reviewed.  Constitutional:      Appearance: Normal appearance.  Eyes:     General: No scleral icterus.       Right eye: No discharge.        Left eye: No discharge.  Musculoskeletal:        General: Tenderness present. No swelling or deformity.     Comments: Lumbar range of motion normal for flexion extension lateral bending and rotation.  There is some pain and stiffness with lateral bending and rotation.  Skin:    General: Skin is warm and dry.  Neurological:     Mental Status: She is alert and oriented to person, place,  and time.     Motor: Motor function is intact.  Psychiatric:        Mood and Affect: Mood normal.        Behavior: Behavior normal.        Thought Content: Thought content normal.        Judgment: Judgment normal.           Assessment & Plan:  #1.  Right sacroiliac pain, chronic improved after sacroiliac radiofrequency neurotomy performed on 03/28/2019.  She had excellent results for at least 4 months and fairly good results for an additional month she is starting to feel her usual pain creep back.  We will schedule for repeat procedure not before the end of July. We discussed that we will be using standard RF.  In the future there is a cooled RF option if duration of effect is shorter than prior.  We also discussed that current symptoms seem more consistent with sacroiliac than with lumbar facet issues although there is some overlap.

## 2019-09-23 ENCOUNTER — Other Ambulatory Visit: Payer: Self-pay | Admitting: Obstetrics and Gynecology

## 2019-09-23 DIAGNOSIS — R928 Other abnormal and inconclusive findings on diagnostic imaging of breast: Secondary | ICD-10-CM

## 2019-09-26 ENCOUNTER — Ambulatory Visit
Admission: RE | Admit: 2019-09-26 | Discharge: 2019-09-26 | Disposition: A | Discharge status: Home / Self Care | Payer: Medicare Other | Source: Ambulatory Visit | Attending: Obstetrics and Gynecology | Admitting: Obstetrics and Gynecology

## 2019-09-26 ENCOUNTER — Ambulatory Visit: Payer: Medicare Other

## 2019-09-26 ENCOUNTER — Other Ambulatory Visit: Payer: Self-pay

## 2019-09-26 DIAGNOSIS — R928 Other abnormal and inconclusive findings on diagnostic imaging of breast: Secondary | ICD-10-CM

## 2019-10-10 ENCOUNTER — Encounter: Payer: Medicare Other | Attending: Physical Medicine & Rehabilitation | Admitting: Physical Medicine & Rehabilitation

## 2019-10-10 ENCOUNTER — Other Ambulatory Visit: Payer: Self-pay

## 2019-10-10 ENCOUNTER — Encounter: Payer: Self-pay | Admitting: Physical Medicine & Rehabilitation

## 2019-10-10 VITALS — BP 137/77 | HR 68 | Temp 98.9°F | Ht 63.0 in | Wt 111.0 lb

## 2019-10-10 DIAGNOSIS — G8929 Other chronic pain: Secondary | ICD-10-CM | POA: Insufficient documentation

## 2019-10-10 DIAGNOSIS — M533 Sacrococcygeal disorders, not elsewhere classified: Secondary | ICD-10-CM

## 2019-10-10 NOTE — Patient Instructions (Addendum)
You had a radio frequency procedure today This was done to alleviate joint pain in your lumbar area We injected lidocaine which is a local anesthetic.  You may experience soreness at the injection sites. You may also experienced some irritation of the nerves that were heated I'm recommending ice for 30 minutes every 2 hours as needed for the next 24-48 hours   

## 2019-10-10 NOTE — Progress Notes (Signed)
  PROCEDURE RECORD [facility]   Name: Kathleen [name] DOB:[birth year] MRN: [id number]  Date:10/10/2019  Physician: Claudette Laws, MD    Nurse/CMA: Wessling, CMA   Allergies:  Allergies  Allergen Reactions  . Clindamycin/Lincomycin Nausea Only    Consent Signed: Yes.    Is patient diabetic? No.  CBG today?   Pregnant: No. LMP: No LMP recorded. Patient is postmenopausal. (age 69-55)  Anticoagulants: no Anti-inflammatory: no Antibiotics: no  Procedure: right sacroiliac radiofrequency ablation  Position: Prone Start Time: 10:32 am  End Time: 10:45am  Fluoro Time: 29s   RN/CMA Wessling, CMA Wessling, CMA    Time 10:18am 10:50am    BP 137/77 149/77    Pulse 68 66    Respirations 14 14    O2 Sat 98 98    S/S 6 6    Pain Level 5/10 0/10     D/C home with husband Kathleen [name], patient A & O X 3, D/C instructions reviewed, and sits independently.      RIght Sacroiliac radio frequency ablation under fluoroscopic guidance This consists of L5 dorsal ramus radio frequency ablation plus neuro lysis of S1-S2 -S3 lateral branches  Indication is sacroiliac pain which has improved temporarily onby at least 50% following sacroiliac intra-articular injection and L5 , S1,2,3 Lateral branch blocks under fluoroscopic guidance. Pain interferes with self-care and mobility and has failed to respond to conservative measures.  Informed consent was obtained after discussing risks and benefits of the procedure with the patient these include bleeding bruising and infection temporary or permanent paralysis. The patient elects to proceed and has given written consent.  Patient placed prone on fluoroscopy table. Area marked and prepped with Betadine. Fluoroscopic images utilized to guide needle. 25-gauge 1.5 inch needle was used to anesthetize 4 injection points with 2 cc of 1% lidocaine each. Then a 18-gauge 10 cm RF needle with a 10 mm curved active tip was inserted  under fluoroscopic guidance first targeting the S1 SA P./sacral ala junction, bone contact made and confirmed with lateral imaging.  motor stimulation at 2 Hz confirm proper needle location followed by injection of one cc of a solution containing   1% lidocaine MPF. Radio frequency 80C for 80 seconds was performed. Then the inferolateral aspect of the S1, S2 and lateral aspect of S3 sacral foramina were targeted. Bone contact made.  motor stim at 2 Hz confirm proper needle location. One ML of the /lidocaine solution was injected into each of 3 sites and radio frequency ablation 80C for 90 seconds was performed. Patient tolerated procedure well. Post procedure instructions given

## 2019-10-20 ENCOUNTER — Other Ambulatory Visit: Payer: Self-pay | Admitting: Sports Medicine

## 2019-10-20 DIAGNOSIS — M47816 Spondylosis without myelopathy or radiculopathy, lumbar region: Secondary | ICD-10-CM

## 2019-10-21 ENCOUNTER — Other Ambulatory Visit: Payer: Self-pay | Admitting: Sports Medicine

## 2019-10-21 DIAGNOSIS — M47816 Spondylosis without myelopathy or radiculopathy, lumbar region: Secondary | ICD-10-CM

## 2019-10-31 ENCOUNTER — Other Ambulatory Visit: Payer: Self-pay | Admitting: Sports Medicine

## 2019-10-31 DIAGNOSIS — M47816 Spondylosis without myelopathy or radiculopathy, lumbar region: Secondary | ICD-10-CM

## 2019-11-08 DIAGNOSIS — M47816 Spondylosis without myelopathy or radiculopathy, lumbar region: Secondary | ICD-10-CM

## 2019-11-10 ENCOUNTER — Other Ambulatory Visit: Payer: Self-pay | Admitting: *Deleted

## 2019-11-10 DIAGNOSIS — M47816 Spondylosis without myelopathy or radiculopathy, lumbar region: Secondary | ICD-10-CM

## 2019-11-10 MED ORDER — DULOXETINE HCL 30 MG PO CPEP: ORAL_CAPSULE | ORAL | 0 refills | Status: DC

## 2019-11-24 ENCOUNTER — Other Ambulatory Visit: Payer: Self-pay | Admitting: Sports Medicine

## 2019-11-24 ENCOUNTER — Other Ambulatory Visit: Payer: Self-pay

## 2019-11-24 ENCOUNTER — Ambulatory Visit (INDEPENDENT_AMBULATORY_CARE_PROVIDER_SITE_OTHER): Payer: Medicare Other | Admitting: Sports Medicine

## 2019-11-24 ENCOUNTER — Encounter: Payer: Self-pay | Admitting: Sports Medicine

## 2019-11-24 DIAGNOSIS — M47816 Spondylosis without myelopathy or radiculopathy, lumbar region: Secondary | ICD-10-CM | POA: Diagnosis not present

## 2019-11-24 DIAGNOSIS — M722 Plantar fascial fibromatosis: Secondary | ICD-10-CM | POA: Diagnosis not present

## 2019-11-24 MED ORDER — DULOXETINE HCL 30 MG PO CPEP: ORAL_CAPSULE | ORAL | 3 refills | Status: DC

## 2019-11-24 MED ORDER — GABAPENTIN 100 MG PO CAPS: ORAL_CAPSULE | ORAL | 3 refills | Status: DC

## 2019-11-24 NOTE — Assessment & Plan Note (Signed)
Kathleen [name] returns, she is working with Dr. Wynn Banker, she did have an SI joint radiofrequency ablation with some relief, they are discussion car ablation now. Refilling gabapentin, Cymbalta, return to see me in 1 year, she can also get these prescribed by her PCP if she desires.

## 2019-11-24 NOTE — Progress Notes (Addendum)
° ° °  Procedures performed today:    None.  Independent interpretation of notes and tests performed by another provider:   None.  Brief History, Exam, Impression, and Recommendations:    Multifactorial low back pain [place] returns, she is working with Dr. Wynn Banker, she did have an SI joint radiofrequency ablation with some relief, they are discussion car ablation now. Refilling gabapentin, Cymbalta, return to see me in 1 year, she can also get these prescribed by her PCP if she desires.    ___________________________________________ [name]. Benjamin Stain, M.D., ABFM., CAQSM. Primary Care and Sports Medicine [facility]  Adjunct Instructor of Family Medicine  [employer]

## 2019-11-24 NOTE — Addendum Note (Signed)
Addended by: Monica Becton on: 11/24/2019 04:47 PM   Modules accepted: Orders

## 2019-12-03 ENCOUNTER — Encounter: Payer: Medicare Other | Admitting: Family Medicine

## 2019-12-04 ENCOUNTER — Encounter: Payer: Self-pay | Admitting: Physical Medicine & Rehabilitation

## 2019-12-04 ENCOUNTER — Encounter: Payer: Medicare Other | Attending: Physical Medicine & Rehabilitation | Admitting: Physical Medicine & Rehabilitation

## 2019-12-04 ENCOUNTER — Other Ambulatory Visit: Payer: Self-pay

## 2019-12-04 VITALS — BP 125/69 | HR 67 | Temp 98.2°F | Ht 63.0 in | Wt 113.4 lb

## 2019-12-04 DIAGNOSIS — M47816 Spondylosis without myelopathy or radiculopathy, lumbar region: Secondary | ICD-10-CM | POA: Diagnosis present

## 2019-12-04 NOTE — Patient Instructions (Signed)
Need to Repeat Right L3,4,5 medial branch blocks and if >50% pain relief would proceed to radiofrequency once injection wears off.

## 2019-12-04 NOTE — Progress Notes (Signed)
Subjective:    Patient ID: Kathleen [name], female    DOB: 1950/06/07, 69 y.o.   MRN: [id number]  HPI 69 year old female with history of chronic right-sided low back pain.  She has had treatment with physical therapy as well as p.o. medications.  She has had greater than 50% relief of right lower back pain and buttock pain with sacroiliac injections under fluoroscopic guidance, right L5 dorsal ramus right S1 is to S3 lateral branch blocks as well as radiofrequency to the same nerves.  Most recently the right sacroiliac radiofrequency procedure was performed 10/10/2019.  No postprocedural complications.  She notes some increasing pain on the right side of the low back area.  She has discussed this with her physical therapist who feels this may be more facet joint related.  The last right L3-4-5 medial branch blocks were performed in January 2020.  She has had 1 set which have provided greater than 50% relief.  The patient has not undergone radiofrequency procedure to the same nerves.  The patient does not have any pain radiating into the leg below the knee.  She does have some posterior and lateral thigh pain right side greater than left side. She remains independent with all self-care and mobility. Pain Inventory Average Pain 6 Pain Right Now 6 My pain is constant, sharp, burning, dull, stabbing and tingling  In the last 24 hours, has pain interfered with the following? General activity 6 Relation with others 6 Enjoyment of life 5 What TIME of day is your pain at its worst? morning  and evening Sleep (in general) NA  Pain is worse with: bending, sitting, standing and some activites Pain improves with: rest, heat/ice, therapy/exercise, pacing activities and injections Relief from Meds: 2  No family history on file. Social History   Socioeconomic History  . Marital status: Married    Spouse name: Not on file  . Number of children: Not on file  . Years of education: Not on file  .  Highest education level: Not on file  Occupational History  . Not on file  Tobacco Use  . Smoking status: Never Smoker  . Smokeless tobacco: Never Used  Substance and Sexual Activity  . Alcohol use: Not on file  . Drug use: Not on file  . Sexual activity: Not on file  Other Topics Concern  . Not on file  Social History Narrative  . Not on file   Social Determinants of Health   Financial Resource Strain:   . Difficulty of Paying Living Expenses: Not on file  Food Insecurity:   . Worried About Programme researcher, broadcasting/film/video in the Last Year: Not on file  . Ran Out of Food in the Last Year: Not on file  Transportation Needs:   . Lack of Transportation (Medical): Not on file  . Lack of Transportation (Non-Medical): Not on file  Physical Activity:   . Days of Exercise per Week: Not on file  . Minutes of Exercise per Session: Not on file  Stress:   . Feeling of Stress : Not on file  Social Connections:   . Frequency of Communication with Friends and Family: Not on file  . Frequency of Social Gatherings with Friends and Family: Not on file  . Attends Religious Services: Not on file  . Active Member of Clubs or Organizations: Not on file  . Attends Banker Meetings: Not on file  . Marital Status: Not on file   No past surgical history on file.  No past surgical history on file. No past medical history on file. BP 125/69   Pulse 67   Temp 98.2 F (36.8 C)   Ht 5\' 3"  (1.6 m)   Wt 113 lb 6.4 oz (51.4 kg)   SpO2 98%   BMI 20.09 kg/m   Opioid Risk Score:   Fall Risk Score:  `1  Depression screen PHQ 2/9  Depression screen [facility] 2/9 12/04/2019 07/02/2018 04/01/2018 11/13/2017 09/11/2017 04/02/2017 10/26/2016  Decreased Interest 0 0 0 0 0 0 0  Down, Depressed, Hopeless 0 0 0 0 0 0 0  PHQ - 2 Score 0 0 0 0 0 0 0    Review of Systems  Constitutional: Negative.   HENT: Negative.   Eyes: Negative.   Respiratory: Negative.   Cardiovascular: Negative.   Gastrointestinal: Negative.    Endocrine: Negative.   Genitourinary: Negative.   Musculoskeletal: Negative.   Skin: Negative.   Allergic/Immunologic: Negative.   Neurological:       Tingling  Hematological: Negative.   Psychiatric/Behavioral: Negative.   All other systems reviewed and are negative.      Objective:   Physical Exam Vitals and nursing note reviewed.  Constitutional:      General: She is not in acute distress.    Appearance: She is normal weight.  Eyes:     Extraocular Movements: Extraocular movements intact.     Conjunctiva/sclera: Conjunctivae normal.     Pupils: Pupils are equal, round, and reactive to light.  Musculoskeletal:     Right lower leg: No edema.     Left lower leg: No edema.     Comments: Lumbar spine has normal range of motion flexion extension lateral bending and rotation.  Lateral bending toward the left provokes pain in the right lumbar area Faber's test is negative bilaterally Lateral compression test is normal bilaterally Pelvic compression test normal. Negative straight leg raising test  Skin:    General: Skin is warm and dry.  Neurological:     Mental Status: She is alert and oriented to person, place, and time.     Comments: Motor strength is 5/5 bilateral lower limbs Gait is normal  Psychiatric:        Mood and Affect: Mood normal.        Behavior: Behavior normal.           Assessment & Plan:  #1.  Chronic right-sided low back pain combination between right sacroiliac disorder as well as right facet mediated pain.  In addition she has a myofascial component as well. Sacroiliac provocative tests are negative.  Recent successful sacroiliac radiofrequency. Feel that her current complaints are more related to lumbar spondylosis at L4-5 and L5-S1 on the right side. We will schedule for repeat right L3-4-5 medial branch blocks under fluoroscopic guidance.  Discussed with patient agrees with plan

## 2019-12-25 ENCOUNTER — Other Ambulatory Visit: Payer: Self-pay

## 2019-12-25 ENCOUNTER — Encounter: Payer: Self-pay | Admitting: Physical Medicine & Rehabilitation

## 2019-12-25 ENCOUNTER — Encounter (HOSPITAL_BASED_OUTPATIENT_CLINIC_OR_DEPARTMENT_OTHER): Payer: Medicare Other | Admitting: Physical Medicine & Rehabilitation

## 2019-12-25 VITALS — BP 142/82 | HR 62 | Temp 98.5°F | Ht 63.0 in | Wt 114.0 lb

## 2019-12-25 DIAGNOSIS — M47816 Spondylosis without myelopathy or radiculopathy, lumbar region: Secondary | ICD-10-CM | POA: Diagnosis not present

## 2019-12-25 NOTE — Progress Notes (Signed)
  PROCEDURE RECORD [facility]   Name: Fredna Stricker DOB:1950/11/15 MRN: [id number]  Date:12/25/2019  Physician: Claudette Laws, MD    Nurse/CMA: Jemarion Roycroft, CMA  Allergies:  Allergies  Allergen Reactions  . Clindamycin/Lincomycin Nausea Only    Consent Signed: Yes.    Is patient diabetic? No.  CBG today?   Pregnant: No. LMP: No LMP recorded. Patient is postmenopausal. (age 69-55)  Anticoagulants: no Anti-inflammatory: yes (meloxicam) Antibiotics: no  Procedure: right L3,4,5 medial branch block  Position: Prone Start Time: 10:05am      End Time: 10:11am  Fluoro Time: 22s  RN/CMA Adanna Zuckerman, CMA Daren Yeagle, CMA    Time 9:45am 10:19am     BP 142/82 155/73    Pulse 62 63    Respirations 14 14    O2 Sat 98 98    S/S 6 6    Pain Level 4/10 0/10     D/C home with husband, patient A & O X 3, D/C instructions reviewed, and sits independently.

## 2019-12-25 NOTE — Progress Notes (Signed)
Right lumbar L3, L4 medial branch blocks and L5 dorsal ramus injection under fluoroscopic guidance  Indication: Right Lumbar pain which is not relieved by medication management or other conservative care and interfering with self-care and mobility.  Informed consent was obtained after describing risks and benefits of the procedure with the patient, this includes bleeding, bruising, infection, paralysis and medication side effects. The patient wishes to proceed and has given written consent. The patient was placed in a prone position. The lumbar area was marked and prepped with Betadine. One ML of 1% lidocaine was injected into each of 3 areas into the skin and subcutaneous tissue. Then a 22-gauge 3.5 in spinal needle was inserted targeting the junction of the Right S1 superior articular process and sacral ala junction. Needle was advanced under fluoroscopic guidance. Bone contact was made.Isovue 200 was injected x0.5 mL demonstrating no intravascular uptake. Then a solution containing 2% MPF lidocaine was injected x0.5 mL. Then the Right L5 superior articular process in transverse process junction was targeted. Bone contact was made.Isovue 200 was injected x0.5 mL demonstrating no intravascular uptake. Then a solution containing 2% MPF lidocaine was injected x0.5 mL. Then the Right L4 superior articular process in transverse process junction was targeted. Bone contact was made. Isovue 200 was injected x0.5 mL demonstrating no intravascular uptake. Then a solution containing2% MPF lidocaine was injected x0.5 mL Patient tolerated procedure well. Post procedure instructions were given. Please refer to post procedure form. 

## 2019-12-25 NOTE — Patient Instructions (Signed)

## 2020-03-10 ENCOUNTER — Other Ambulatory Visit: Payer: Self-pay | Admitting: Sports Medicine

## 2020-03-10 DIAGNOSIS — M47816 Spondylosis without myelopathy or radiculopathy, lumbar region: Secondary | ICD-10-CM

## 2020-03-10 MED ORDER — MELOXICAM 15 MG PO TABS: 15.0000 mg | ORAL_TABLET | Freq: Every day | ORAL | 3 refills | Status: DC | PRN

## 2020-03-10 MED ORDER — GABAPENTIN 600 MG PO TABS: ORAL_TABLET | ORAL | 3 refills | Status: DC

## 2020-03-10 MED ORDER — GABAPENTIN 100 MG PO CAPS
ORAL_CAPSULE | ORAL | 3 refills | Status: DC
Start: 2020-03-10 — End: 2021-12-19

## 2020-03-12 ENCOUNTER — Ambulatory Visit: Payer: Medicare Other | Admitting: Physical Medicine & Rehabilitation

## 2020-03-25 ENCOUNTER — Encounter: Payer: Medicare Other | Attending: Physical Medicine & Rehabilitation | Admitting: Physical Medicine & Rehabilitation

## 2020-03-25 ENCOUNTER — Encounter: Payer: Self-pay | Admitting: Physical Medicine & Rehabilitation

## 2020-03-25 ENCOUNTER — Other Ambulatory Visit: Payer: Self-pay

## 2020-03-25 VITALS — BP 145/78 | HR 64 | Temp 99.4°F | Ht 63.0 in | Wt 113.0 lb

## 2020-03-25 DIAGNOSIS — M47816 Spondylosis without myelopathy or radiculopathy, lumbar region: Secondary | ICD-10-CM | POA: Diagnosis present

## 2020-03-25 NOTE — Progress Notes (Signed)
Subjective:    Patient ID: Kathleen [name], female    DOB: 05-10-1950, 70 y.o.   MRN: [id number]  HPI Pt with low back pain and stiffness in am and  evening, pain gets to a moderate level.  The patient did do a walk 4 miles on New Year's Day at a slow speed.  She was quite happy that she did not aggravate her pain.  Pt did well with right L3-L4 medial branch blocks, right L5 dorsal ramus injection for 1 month , doing PT and stretching at home.  Also does planking, core strengthening  Right sacroiliac RF was about 1 year ago, the patient has not noted any increase in right sided buttock pain  The patient also notes occasional shooting, "sciatic" pain down both legs.  She has been told by PT that she has tight TF L but sometimes feels like there may be a different type of pain as well.  The patient has not noted any weakness in the legs no numbness or tingling.  No new bowel or bladder dysfunction    EXAM: MRI LUMBAR SPINE WITHOUT CONTRAST  TECHNIQUE: Multiplanar, multisequence MR imaging of the lumbar spine was performed. No intravenous contrast was administered.  COMPARISON:  01/30/2015  FINDINGS: Segmentation:  Standard.  Alignment:  2 mm anterolisthesis of L4 on L5 and L5 on S1.  Vertebrae: No fracture, evidence of discitis, or bone lesion. Bilateral L5 pars interarticularis defect.  Conus medullaris and cauda equina: Conus extends to the T12 level. Conus and cauda equina appear normal.  Paraspinal and other soft tissues: No paraspinal abnormality.  Disc levels:  Disc spaces: Disc desiccation throughout the lumbar spine. Disc heights are maintained.  T12-L1: No significant disc bulge. No evidence of neural foraminal stenosis. No central canal stenosis.  L1-L2: No significant disc bulge. No evidence of neural foraminal stenosis. No central canal stenosis.  L2-L3: Minimal broad-based disc bulge. Mild bilateral facet arthropathy. No evidence of neural  foraminal stenosis. No central canal stenosis.  L3-L4: Mild broad-based disc bulge. Mild bilateral facet arthropathy. No evidence of neural foraminal stenosis. No central canal stenosis.  L4-L5: Mild broad-based disc bulge with a central disc protrusion. Bilateral lateral recess stenosis. Moderate bilateral facet arthropathy. No evidence of neural foraminal stenosis. No central canal stenosis.  L5-S1: Broad-based disc bulge. Bilateral mild foraminal narrowing. No central canal stenosis.  IMPRESSION: 1. At L4-5 there is a mild broad-based disc bulge with a central disc protrusion. Bilateral lateral recess stenosis. Moderate bilateral facet arthropathy. 2. No acute injury of the lumbar spine.   Electronically Signed   By: Elige Ko   On: 02/21/2017 14:22  Pain Inventory Average Pain 4 Pain Right Now 3 My pain is dull and aching  In the last 24 hours, has pain interfered with the following? General activity 4 Relation with others 4 Enjoyment of life 3 What TIME of day is your pain at its worst? evening Sleep (in general) Good  Pain is worse with: bending, sitting, inactivity and standing Pain improves with: rest, heat/ice, therapy/exercise and injections Relief from Meds: 5  No family history on file. Social History   Socioeconomic History  . Marital status: Married    Spouse name: Not on file  . Number of children: Not on file  . Years of education: Not on file  . Highest education level: Not on file  Occupational History  . Not on file  Tobacco Use  . Smoking status: Never Smoker  . Smokeless tobacco: Never  Used  Substance and Sexual Activity  . Alcohol use: Not on file  . Drug use: Not on file  . Sexual activity: Not on file  Other Topics Concern  . Not on file  Social History Narrative  . Not on file   Social Determinants of Health   Financial Resource Strain: Not on file  Food Insecurity: Not on file  Transportation Needs: Not on file   Physical Activity: Not on file  Stress: Not on file  Social Connections: Not on file   No past surgical history on file. No past surgical history on file. No past medical history on file. BP (!) 145/78   Pulse 64   Temp 99.4 F (37.4 C)   Ht 5\' 3"  (1.6 m)   Wt 113 lb (51.3 kg)   SpO2 96%   BMI 20.02 kg/m   Opioid Risk Score:   Fall Risk Score:  `1  Depression screen PHQ 2/9  Depression screen [facility] 2/9 12/04/2019 07/02/2018 04/01/2018 11/13/2017 09/11/2017 04/02/2017 10/26/2016  Decreased Interest 0 0 0 0 0 0 0  Down, Depressed, Hopeless 0 0 0 0 0 0 0  PHQ - 2 Score 0 0 0 0 0 0 0    Review of Systems     Objective:   Physical Exam Vitals and nursing note reviewed.  HENT:     Head: Normocephalic and atraumatic.  Eyes:     Extraocular Movements: Extraocular movements intact.     Conjunctiva/sclera: Conjunctivae normal.     Pupils: Pupils are equal, round, and reactive to light.  Musculoskeletal:     Cervical back: Normal range of motion. No rigidity or tenderness.     Comments: Mild tenderness palpation right lumbar paraspinal area  Gaenslens: Negative on the left positive on right Sacral thrust (prone) : Negative Lateral compression: Negative FABER's: Negative Distraction (supine): Negative   Neurological:     General: No focal deficit present.     Mental Status: She is alert and oriented to person, place, and time.     Comments: Motor strength is 5/5 bilateral deltoid, bicep, tricep, grip, hip flexor, knee extensor, ankle dorsiflexor and plantar flexor Sensation intact to light touch bilateral upper and lower limbs Negative straight leg raising Ambulates without assistive device no evidence of toe drag or knee instability.  Psychiatric:        Mood and Affect: Mood normal.        Behavior: Behavior normal.   Right hand has multiple joint deformities at PIP and DIP most prominent at digits 2 and 3 Left hand has DIP deformity at digit 2, no pain with carpometacarpal  testing, no pain with Finkelstein's, negative Tinel's over the left wrist        Assessment & Plan:  #1.  Lumbar spondylosis without myelopathy the patient has had good temporary relief with right L3-L4 medial branch right L5 dorsal ramus injections on 2 occasions, greater than 50% for approximately 3 weeks.  We discussed radiofrequency ablation as a means of increasing duration of efficacy. The patient would like to think about this.  Another option in terms of getting her prepped for a trip to [place] would be to time the medial branch blocks shortly before she leaves.  The patient will call back to schedule  #2.  Right sacroiliac disorder, radiofrequency neurotomy done approximately 1 year ago is still effective, 1 sacroiliac provocative maneuver is mildly positive.  We will continue to monitor, do not recommend repeat at this time  3.  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

## 2020-03-25 NOTE — Patient Instructions (Addendum)
Voltaren gel 3-4 days per day

## 2020-03-30 DIAGNOSIS — G894 Chronic pain syndrome: Secondary | ICD-10-CM

## 2020-05-04 ENCOUNTER — Encounter: Payer: Self-pay | Admitting: Psychology

## 2020-05-04 ENCOUNTER — Encounter: Payer: Medicare Other | Attending: Physical Medicine & Rehabilitation | Admitting: Psychology

## 2020-05-04 ENCOUNTER — Telehealth: Payer: Self-pay | Admitting: Physical Medicine & Rehabilitation

## 2020-05-04 ENCOUNTER — Other Ambulatory Visit: Payer: Self-pay

## 2020-05-04 DIAGNOSIS — G8929 Other chronic pain: Secondary | ICD-10-CM | POA: Diagnosis not present

## 2020-05-04 DIAGNOSIS — M533 Sacrococcygeal disorders, not elsewhere classified: Secondary | ICD-10-CM | POA: Diagnosis present

## 2020-05-04 DIAGNOSIS — M47816 Spondylosis without myelopathy or radiculopathy, lumbar region: Secondary | ICD-10-CM | POA: Diagnosis not present

## 2020-05-04 DIAGNOSIS — G894 Chronic pain syndrome: Secondary | ICD-10-CM | POA: Insufficient documentation

## 2020-05-04 NOTE — Progress Notes (Addendum)
HEALTH BEHAVIORAL ASSESSMENT   DOS: 05/04/20   START TIME: 10:00 AM  END TIME:  12:00 PM   Subjective:    Patient ID: Kathleen [name] is a 70 y.o. female.  DOB: [birth year], 70 y.o.    MRN: [id number]  Chief Complaint:  HPI   Kathleen [name] is a 70 y.o. female who is referred by Dr. Wynn Banker (Physiatry) for health behavioral assessment in the context of lumbar spondylosis without myelopathy and sacroiliac pain affecting primarily the right side.  Review of medical records show patient underwent nerve blocks to the sacroiliac i.e. L5 dorsal ramus S1-S2-S3 lateral branch blocks with good short-term pain relief followed by radiofrequency ablation of the right sacroiliac (01/2018). Onset of right-sided lumbar pain occurred after sacroiliac pain subsided. Underwent right-sided L3-L4 medial branch and L5 dorsal ramus injection under fluoroscopic guidance (04/15/2018). Underwent right L5 dorsal ramus right S1 is to S3 lateral branch blocks as well as radiofrequency neurotomy on 03/28/2019; good relief of usual pain.  Described some hypersensitivity over injection site for a couple weeks that eventually subsided.  She has been able to ambulate on uneven surfaces without aggravating her pain.   The following portions of the patient's history were reviewed and updated as appropriate:   She  has no past medical history on file. She does not have any pertinent problems on file. She  has no past surgical history on file. Her family history is not on file. She  reports that she has never smoked. She has never used smokeless tobacco. No history on file for alcohol use and drug use. She has a current medication list which includes the following prescription(s): acetaminophen, alpha-lipoic acid, vitamin c, azelastine, calcium carbonate-vit d-min, cevimeline, coq10, diclofenac sodium, duloxetine, fexofenadine, gabapentin, gabapentin, hydroxychloroquine, ipratropium, levothyroxine, melatonin, meloxicam, fish  oil, omeprazole, restasis, vitamin d (ergocalciferol), and zolpidem. Current Outpatient Medications on File Prior to Visit  Medication Sig Dispense Refill  . acetaminophen (TYLENOL) 325 MG tablet Take 650 mg by mouth every 6 (six) hours as needed.    . Alpha-Lipoic Acid 300 MG CAPS     . Ascorbic Acid (VITAMIN C) 100 MG tablet Take 100 mg by mouth daily.    Marland Kitchen azelastine (ASTELIN) 0.1 % nasal spray Place 1 spray into both nostrils 2 (two) times daily.    . Calcium Carbonate-Vit D-Min (CALTRATE 600+D PLUS MINERALS PO) Take 1 tablet by mouth daily.    . cevimeline (EVOXAC) 30 MG capsule Take 1 capsule by mouth 3 (three) times daily.    . Coenzyme Q10 (COQ10) 200 MG CAPS Take by mouth.    . diclofenac sodium (VOLTAREN) 1 % GEL APPLY EXTERNALLY TO THE AFFECTED AREA FOUR TIMES DAILY 300 g 2  . DULoxetine (CYMBALTA) 30 MG capsule TAKE ONE CAPSULE BY MOUTH EVERY DAY 90 capsule 3  . fexofenadine (ALLEGRA) 180 MG tablet Take 180 mg by mouth daily.    Marland Kitchen gabapentin (NEURONTIN) 100 MG capsule TAKE 2 CAPSULES BY MOUTH EVERY MORNING AND 2 CAPSULES BY MOUTH AT LUNCH 360 capsule 3  . gabapentin (NEURONTIN) 600 MG tablet TAKE 1 TABLET(600 MG) BY MOUTH AT BEDTIME 90 tablet 3  . hydroxychloroquine (PLAQUENIL) 200 MG tablet Take 1 tablet by mouth daily.    Marland Kitchen ipratropium (ATROVENT) 0.06 % nasal spray Place into the nose.    . levothyroxine (SYNTHROID) 75 MCG tablet Take 75 mcg by mouth daily before breakfast.    . Melatonin 5 MG CAPS Take 1 tablet by mouth at bedtime.    Marland Kitchen  meloxicam (MOBIC) 15 MG tablet Take 1 tablet (15 mg total) by mouth daily as needed for pain. 90 tablet 3  . Omega-3 Fatty Acids (FISH OIL) 1000 MG CAPS Take by mouth.    Marland Kitchen omeprazole (PRILOSEC) 40 MG capsule Take 40 mg by mouth daily.    . RESTASIS 0.05 % ophthalmic emulsion 1 drop 2 (two) times daily.    . Vitamin D, Ergocalciferol, (DRISDOL) 1.25 MG (50000 UT) CAPS capsule TAKE 1 CAPSULE BY MOUTH EVERY 7 DAYS 12 capsule 0  . zolpidem (AMBIEN)  10 MG tablet Take 10 mg by mouth at bedtime as needed for sleep.     No current facility-administered medications on file prior to visit.   She is allergic to clindamycin/lincomycin.. Review of Systems  Objective:  Physical Exam Psychiatric:        Attention and Perception: Attention and perception normal.        Mood and Affect: Mood is anxious and depressed. Mood is not elated. Affect is not labile, blunt, flat, angry, tearful or inappropriate.        Speech: Speech normal.        Behavior: Behavior is agitated (mild when in pain), slowed (when in pain) and withdrawn (slightly due to pain). Behavior is not aggressive, hyperactive or combative. Behavior is cooperative.        Thought Content: Thought content normal.        Cognition and Memory: Cognition and memory normal.        Judgment: Judgment normal.   Imaging:  MRI Lumbar Spine without contrast on 02/21/2017 showed the following impressions (compared to 01/2015):  1. At L4-5 there is a mild broad-based disc bulge with a central disc protrusion. Bilateral lateral recess stenosis. Moderate bilateral facet arthropathy. 2. No acute injury of the lumbar spine.  Lab Review:  not applicable  Assessment:   Spondylosis without myelopathy or radiculopathy, lumbar region  Chronic right sacroiliac pain   Medical Necessity: Services to be provided are reasonable and medically necessary for the diagnosis and/or treatment of illness/injury in order to improve the function: Yes   Services to be provided are specific, effective and of a complexity and sophistication that requires the skills of a licensed psychologist or other qualified mental health professional: Yes  Services to be provided are of appropriate amount, duration and frequency within accepted standards of medical practice per the diagnosis: Yes  Is it anticipated that the patient will require more extensive therapy services, potentially over a longer period of time, than  typical for the condition being treated? No  Plan:   We scheduled patient for 8 biweekly therapy visits to work on building coping skills for managing chronic pain, promote functional improvement, and lessen impact on psychosocial functioning. Mindfulness based CBT for chronic pain was selected as primary mode of intervention. First therapy visit scheduled for 05/27/20

## 2020-05-04 NOTE — Telephone Encounter (Signed)
Patient would like to know if MBB or Radio Frequency should be procedure. She thought it was RF and Notes indicate MBB. Please advise

## 2020-05-04 NOTE — Telephone Encounter (Signed)
Next step is right L3-L4-L5 radiofrequency neurotomy

## 2020-05-05 ENCOUNTER — Telehealth: Payer: Self-pay | Admitting: Physical Medicine & Rehabilitation

## 2020-05-05 NOTE — Telephone Encounter (Signed)
2-3 wks

## 2020-05-05 NOTE — Telephone Encounter (Signed)
Patient would like to know how long does she have to wait to get SI injections after Radio Frequency.

## 2020-05-18 ENCOUNTER — Telehealth: Payer: Self-pay | Admitting: *Deleted

## 2020-05-18 NOTE — Telephone Encounter (Signed)
Kathleen [name] called to ask about (see her previous message in Mychart) having the SI RF at her next appt. She is wanting to have the SI done first and then 2 weeks later have the L3-4-5 RF. After speaking with Dr Wynn Banker, it will have to be the L3-4-5 RF unless she wants to come in and be re-evaluated for the SI. According to her last visit note he did not recommend the SI be done at this time.  She is going to think about what she is going to have done and call us back.  He can not do both procedures 2 weeks apart like she is proposing,

## 2020-05-27 ENCOUNTER — Encounter (HOSPITAL_BASED_OUTPATIENT_CLINIC_OR_DEPARTMENT_OTHER): Payer: Medicare Other | Admitting: Psychology

## 2020-05-27 ENCOUNTER — Other Ambulatory Visit: Payer: Self-pay

## 2020-05-27 ENCOUNTER — Encounter: Payer: Self-pay | Admitting: Psychology

## 2020-05-27 DIAGNOSIS — G894 Chronic pain syndrome: Secondary | ICD-10-CM

## 2020-05-27 DIAGNOSIS — M533 Sacrococcygeal disorders, not elsewhere classified: Secondary | ICD-10-CM

## 2020-05-27 DIAGNOSIS — M47816 Spondylosis without myelopathy or radiculopathy, lumbar region: Secondary | ICD-10-CM

## 2020-05-27 DIAGNOSIS — G8929 Other chronic pain: Secondary | ICD-10-CM

## 2020-05-27 NOTE — Progress Notes (Addendum)
Type of treatment:                Health Behavioral Intervention  Visit Number:                         2 of 9 Number of Visits:   2 of 9 Start:            3:00PM                              End:                                        4:00PM  Subjective:    Patient ID: Kathleen [name] is a 70 y.o. female. DOB: [id number]  MRN: [id number]  Chief Complaint: Chronic lumbar and sacroiliac pain   HPI   Kathleen [name] is a 70 y.o. female referred by Dr. Wynn Banker (Physiatry) for health behavioral intervention in the context lumbar spondylosis without myelopathy as well as sacroiliac pain affecting primarily the right side.  Review of medical records show patient underwent nerve blocks to the sacroiliac i.e. L5 dorsal ramus S1-S2-S3 lateral branch blocks with good short-term pain relief followed by radiofrequency ablation of the right sacroiliac (01/2018). Onset of right-sided lumbar pain occurred after sacroiliac pain subsided. Underwent right-sided L3-L4 medial branch and L5 dorsal ramus injection under fluoroscopic guidance (04/15/2018). Underwent right L5 dorsal ramus right S1 is to S3 lateral branch blocks as well as radiofrequency neurotomy on 03/28/2019; good relief of usual pain.   Described some hypersensitivity over injection site for a couple weeks that eventually subsided.   The following portions of the patient's history were reviewed and updated as appropriate: allergies, current medications, past family history, past medical history, past social history, past surgical history and problem list. Review of Systems   Current Visit (05/27/20): Rates pain as 4 or 5 out of 10. Excited to go on vacation with friends in May but worries about completing all things on "to do list" and feeling adequately prepared. Mentions feeling more restless at night this past week and having trouble falling back to sleep after waking up early; thinking about things to do before her trip.   Admits that  pain predominates thoughts and conversations with husband and friends (~40-50%). States that pain makes her feel physically and mentally tired towards the end of the day (e.g., 3pm-on). Pain interferes with "body, mind, and soul" and causes distress. She describes having trouble requesting help from others as she "never knows how they are going to respond". If a friend invites her for a hike or another physical activity, she worries about the distance and level of difficulty, even after being reassured its within her comfort zone.   Wants to learn how to live with pain more effectively and grow spiritually. Has not been practicing diaphragmatic breathing technique. Forgot recommended time ratio for exhale:inhale (e.g., in for 3-4 seconds, hold for 3-4, exhale for 6-8).  Objective:  Physical Exam Constitutional:      Appearance: Normal appearance. She is normal weight.  Neurological:     Mental Status: She is alert.  Psychiatric:        Attention and Perception: Attention and perception normal.        Mood and Affect: Mood is anxious.  Speech: Speech normal.        Behavior: Behavior is cooperative.        Thought Content: Thought content normal.        Cognition and Memory: Cognition and memory normal.        Judgment: Judgment normal.   Lab Review:  not applicable  Assessment:   Spondylosis without myelopathy or radiculopathy, lumbar region  Chronic right sacroiliac pain   Intervention: Mindfulness based CBT   Participation: Active   Response/Effectiveness: Good and appropriate   Therapist Response: Set agenda. Mood and pain check/assessment. Formulated initial therapy goals and discussed plan. Reviewed cognitive model. Re-instruction for diaphragmatic breathing. Introduced mindfulness. Assigned homework.   Homework: Mindful journaling exercise   Plan:   Mindfulness based CBT for chronic pain on biweekly basis for total of 8 visits. Next IND visit scheduled for  06/09/20  Billing/Service Summary:  [phone] (Health behavior intervention, individual, face-to-face; initial 30 minutes) x1  [phone] (Health behavior intervention, individual, face-to-face; each additional 15 minutes) x 2

## 2020-05-27 NOTE — Addendum Note (Signed)
Addended by: Thayer Headings R on: 05/27/2020 08:32 PM   Modules accepted: Level of Service

## 2020-05-28 ENCOUNTER — Encounter: Payer: Self-pay | Admitting: Physical Medicine & Rehabilitation

## 2020-05-28 ENCOUNTER — Encounter: Payer: Medicare Other | Attending: Physical Medicine & Rehabilitation | Admitting: Physical Medicine & Rehabilitation

## 2020-05-28 VITALS — BP 162/96 | HR 62 | Temp 98.7°F | Ht 63.0 in | Wt 113.0 lb

## 2020-05-28 DIAGNOSIS — M47816 Spondylosis without myelopathy or radiculopathy, lumbar region: Secondary | ICD-10-CM | POA: Insufficient documentation

## 2020-05-28 DIAGNOSIS — M533 Sacrococcygeal disorders, not elsewhere classified: Secondary | ICD-10-CM | POA: Insufficient documentation

## 2020-05-28 DIAGNOSIS — G8929 Other chronic pain: Secondary | ICD-10-CM | POA: Diagnosis present

## 2020-05-28 NOTE — Patient Instructions (Signed)
You had a radio frequency procedure today This was done to alleviate joint pain in your lumbar area We injected lidocaine which is a local anesthetic.  You may experience soreness at the injection sites. You may also experienced some irritation of the nerves that were heated I'm recommending ice for 30 minutes every 2 hours as needed for the next 24-48 hours   

## 2020-05-28 NOTE — Progress Notes (Signed)
  PROCEDURE RECORD [facility]   Name: Kathleen [name] DOB:[birth year] MRN: [id number]  Date:05/28/2020  Physician: Claudette Laws, MD    Nurse/CMA: Jax Kentner, CMA  Allergies:  Allergies  Allergen Reactions  . Clindamycin/Lincomycin Nausea Only    Consent Signed: Yes.    Is patient diabetic? No.  CBG today?   Pregnant: No. LMP: No LMP recorded. Patient is postmenopausal. (age 70-55)  Anticoagulants: no Anti-inflammatory: yes (meloxicam) Antibiotics: no  Procedure: right L3,4,5 radiofrequency neurotomy  Position: Prone Start Time: 1:22 PM End Time: 1:36 PM  Fluoro Time: 1:23   RN/CMA Shealynn Saulnier, CMA Tynan Boesel, CMA    Time 1:07pm 1:43pm    BP 162/96 155/89    Pulse 62 60    Respirations 14 14    O2 Sat 97 98    S/S 6 6    Pain Level 4/10 5/10 injection site pain     D/C home with husband, patient A & O X 3, D/C instructions reviewed, and sits independently.

## 2020-05-28 NOTE — Progress Notes (Signed)

## 2020-06-09 ENCOUNTER — Encounter (HOSPITAL_BASED_OUTPATIENT_CLINIC_OR_DEPARTMENT_OTHER): Payer: Medicare Other | Admitting: Psychology

## 2020-06-09 ENCOUNTER — Other Ambulatory Visit: Payer: Self-pay

## 2020-06-09 DIAGNOSIS — G8929 Other chronic pain: Secondary | ICD-10-CM | POA: Diagnosis not present

## 2020-06-09 DIAGNOSIS — M533 Sacrococcygeal disorders, not elsewhere classified: Secondary | ICD-10-CM | POA: Diagnosis not present

## 2020-06-09 DIAGNOSIS — M47816 Spondylosis without myelopathy or radiculopathy, lumbar region: Secondary | ICD-10-CM

## 2020-06-09 NOTE — Progress Notes (Addendum)
Type of treatment: Health Behavioral Intervention Visit Number: 3 of 9 Number of Visits:     3 of 9 Start:                          4:00 PM End:    5:00 PM  Subjective:    Patient ID: Kathleen [name] is a 70 y.o. female.    DOB: [id number]                    MRN: [id number]  Chief Complaint: Chronic pain and negative impact on mood and  behavior.   Kathleen [name] is a 70 y.o. femalereferred by Dr. Wynn Banker (Physiatry) for health behavioral intervention in the context lumbar spondylosiswithout myelopathyas well as sacroiliac pain affecting primarily the right side.  Review of medical records show patientunderwent nerve blocks to the sacroiliac i.e. L5 dorsal ramus S1-S2-S3 lateral branch blocks with good short-term pain relief followed by radiofrequency ablation of the right sacroiliac(01/2018). Onset of right-sided lumbar pain occurred after sacroiliac pain subsided. Underwent right-sided L3-L4 medial branch and L5 dorsal ramus injection under fluoroscopic guidance(04/15/2018). Underwent right L5 dorsal ramus right S1 is to S3 lateral branch blocks as well as radiofrequency neurotomy on 03/28/2019;good relief of usual pain.  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   The following portions of the patient's history were reviewed and updated as appropriate: allergies, current medications, past family history, past medical history, past social history, past surgical history and problem list.  Review of Systems   Previous Visit (05/27/20): Rates pain as 4 or 5 out of 10. Excited to go on vacation with friends in May but worries about completing all things on "to do list" and feeling adequately prepared. Mentions feeling more restless at night this past week and having trouble falling back to sleep after waking up early; thinking about things to do before her trip.    Admits that pain predominates thoughts and conversations with husband and friends (~40-50%). States that pain makes her feel physically and mentally tired towards the end of the day (e.g., 3pm-on). Pain interferes with "body, mind, and soul" and causes distress. She describes having trouble requesting help from others as she "never knows how they are going to respond". If a friend invites her for a hike or another physical activity, she worries about the distance and level of difficulty, even after being reassured its within her comfort zone.   Wants to learn how to live with pain more effectively and grow spiritually. Has not been practicing diaphragmatic breathing technique. Forgot recommended time ratio for exhale:inhale (e.g., in for 3-4 seconds, hold for 3-4, exhale for 6-8).  States that she developed COVID-19 on recent cruise but managed to have a good time.   Objective:  Physical Exam Constitutional:      Appearance: Normal appearance. She is normal weight.  Neurological:     Mental Status: She is alert and oriented to person, place, and time.  Psychiatric:        Attention and Perception: Attention and perception normal.        Mood and Affect: Affect normal. Mood is anxious.        Speech: Speech normal.        Behavior: Behavior normal. Behavior is cooperative.        Thought Content: Thought content normal.        Cognition and Memory: Cognition and memory normal.  Judgment: Judgment normal.    Lab Review:  not applicable  Assessment:   Spondylosis without myelopathy or radiculopathy, lumbar region  Chronic right sacroiliac pain   Intervention: Mindfulness based CBT   Participation: Active   Response/Effectiveness: Good and appropriate   Therapist Response: Set agenda. Mood and pain check/assessment. Reviewed therapy goals and plan. Reviewed and discussed homework. Elaborated on mindfulness and highlighted differences between relaxation, meditation, and  other techniques. Re-instruction for diaphragmatic breathing. Introduced and taught progressive muscle relaxation. Assigned homework.   Homework: Mindful walking at least 1-2 times per week   Plan:   6 more scheduled visits to continue mindfulness based CBT for chronic pain. Patient on vacation in early May. Next IND visit scheduled for 07/20/20. Will resume biweekly visits.   Billing/Service Summary:  [phone] (Health behavior intervention, individual, face-to-face; initial 30 minutes) x1  [phone] (Health behavior intervention, individual, face-to-face; each additional 15 minutes) x 2

## 2020-06-13 ENCOUNTER — Encounter: Payer: Self-pay | Admitting: Psychology

## 2020-06-18 ENCOUNTER — Other Ambulatory Visit: Payer: Self-pay

## 2020-06-18 ENCOUNTER — Encounter (HOSPITAL_BASED_OUTPATIENT_CLINIC_OR_DEPARTMENT_OTHER): Payer: Medicare Other | Admitting: Physical Medicine & Rehabilitation

## 2020-06-18 ENCOUNTER — Encounter: Payer: Self-pay | Admitting: Physical Medicine & Rehabilitation

## 2020-06-18 VITALS — BP 149/85 | HR 64 | Temp 99.0°F | Ht 63.0 in | Wt 113.0 lb

## 2020-06-18 DIAGNOSIS — G8929 Other chronic pain: Secondary | ICD-10-CM

## 2020-06-18 DIAGNOSIS — M533 Sacrococcygeal disorders, not elsewhere classified: Secondary | ICD-10-CM | POA: Diagnosis not present

## 2020-06-18 DIAGNOSIS — M47816 Spondylosis without myelopathy or radiculopathy, lumbar region: Secondary | ICD-10-CM

## 2020-06-18 NOTE — Patient Instructions (Signed)
Get on cancellation list

## 2020-06-18 NOTE — Progress Notes (Signed)
Subjective:    Patient ID: Kathleen [name], female    DOB: November 30, 1950, 70 y.o.   MRN: [id number]  HPI   Kathleen [name], Victorino Sparrow, MD Author Type: Physician Filed: 05/28/2020 2:41 PM  Note Status: Signed Cosign: Cosign Not Required Encounter Date: 05/28/2020  Editor: Erick Colace, MD (Physician)                RightL5 dorsal ramus., Right L4 and Right L3 medial branch radio frequency neurotomy under fluoroscopic guidance      70 year old female with chronic right-sided low back pain.  She has had good relief with both right sacroiliac radiofrequency as well as right L3-L4-L5 lumbar radiofrequency neurotomy.  She is planning to go on a trip to [place] and plans to be very active.  She is experiencing some of the right sacroiliac pain now that her lumbar pain has been well relieved.  She continues with her physical therapy and stretching exercises Pain Inventory Average Pain 5 Pain Right Now 3 My pain is intermittent, sharp, dull, tingling and aching  In the last 24 hours, has pain interfered with the following? General activity 2 Relation with others 2 Enjoyment of life 2 What TIME of day is your pain at its worst? evening Sleep (in general) Good  Pain is worse with: bending, sitting, inactivity and standing Pain improves with: rest, heat/ice, therapy/exercise and injections Relief from Meds: 5  History reviewed. No pertinent family history. Social History   Socioeconomic History  . Marital status: Married    Spouse name: Not on file  . Number of children: Not on file  . Years of education: Not on file  . Highest education level: Not on file  Occupational History  . Not on file  Tobacco Use  . Smoking status: Never Smoker  . Smokeless tobacco: Never Used  Substance and Sexual Activity  . Alcohol use: Not on file  . Drug use: Not on file  . Sexual activity: Not on file  Other Topics Concern  . Not on file  Social History Narrative  . Not on file   Social  Determinants of Health   Financial Resource Strain: Not on file  Food Insecurity: Not on file  Transportation Needs: Not on file  Physical Activity: Not on file  Stress: Not on file  Social Connections: Not on file   History reviewed. No pertinent surgical history. History reviewed. No pertinent surgical history. History reviewed. No pertinent past medical history. BP (!) 149/85   Pulse 64   Temp 99 F (37.2 C)   Ht 5\' 3"  (1.6 m)   Wt 113 lb (51.3 kg)   SpO2 99%   BMI 20.02 kg/m   Opioid Risk Score:   Fall Risk Score:  `1  Depression screen PHQ 2/9  Depression screen [facility] 2/9 12/04/2019 07/02/2018 04/01/2018 11/13/2017 09/11/2017 04/02/2017 10/26/2016  Decreased Interest 0 0 0 0 0 0 0  Down, Depressed, Hopeless 0 0 0 0 0 0 0  PHQ - 2 Score 0 0 0 0 0 0 0    Review of Systems  Constitutional: Negative.   HENT: Negative.   Eyes: Negative.   Respiratory: Negative.   Cardiovascular: Negative.   Gastrointestinal: Negative.   Endocrine: Negative.   Genitourinary: Negative.   Musculoskeletal: Positive for arthralgias and back pain.  Skin: Negative.   Allergic/Immunologic: Negative.   Neurological: Positive for numbness.  Hematological: Negative.   Psychiatric/Behavioral: Negative.   All other systems reviewed and are negative.  Objective:   Physical Exam Constitutional:      Appearance: Normal appearance. She is normal weight.  HENT:     Head: Normocephalic and atraumatic.  Eyes:     Extraocular Movements: Extraocular movements intact.     Conjunctiva/sclera: Conjunctivae normal.     Pupils: Pupils are equal, round, and reactive to light.  Musculoskeletal:     Comments: No tenderness palpation lumbar paraspinals there are some tenderness over the PSIS on the right side not on the left    FABER's: Positive on the right and SI joint area Distraction (supine): Negative Thigh thrust test: Negative  Skin:    General: Skin is warm and dry.  Neurological:     Mental  Status: She is alert and oriented to person, place, and time.  Psychiatric:        Mood and Affect: Mood normal.        Behavior: Behavior normal.           Assessment & Plan:  #1.  Right lumbar facet arthropathy improved after right L3-4-5 medial branch neurotomy.  Patient states it took about 2 weeks for the procedural pain to improve.  2.  Right sacroiliac disorder she is about 1 year post sacroiliac radiofrequency neurotomy and does need a repeat however she does not wish to do this prior to her trip due to recovery time.  She may benefit with intra-articular injection sacroiliac joint for a shorter term relief.  We will try to get her in in 1 week for this.

## 2020-07-20 ENCOUNTER — Encounter: Payer: Medicare Other | Attending: Physical Medicine & Rehabilitation | Admitting: Psychology

## 2020-07-20 DIAGNOSIS — M533 Sacrococcygeal disorders, not elsewhere classified: Secondary | ICD-10-CM | POA: Insufficient documentation

## 2020-07-20 DIAGNOSIS — G8929 Other chronic pain: Secondary | ICD-10-CM | POA: Insufficient documentation

## 2020-07-27 ENCOUNTER — Encounter (HOSPITAL_BASED_OUTPATIENT_CLINIC_OR_DEPARTMENT_OTHER): Payer: Medicare Other | Admitting: Physical Medicine & Rehabilitation

## 2020-07-27 ENCOUNTER — Encounter: Payer: Self-pay | Admitting: Physical Medicine & Rehabilitation

## 2020-07-27 ENCOUNTER — Other Ambulatory Visit: Payer: Self-pay

## 2020-07-27 VITALS — BP 152/83 | HR 61 | Temp 98.3°F | Ht 63.0 in | Wt 113.0 lb

## 2020-07-27 DIAGNOSIS — G8929 Other chronic pain: Secondary | ICD-10-CM | POA: Diagnosis present

## 2020-07-27 DIAGNOSIS — M533 Sacrococcygeal disorders, not elsewhere classified: Secondary | ICD-10-CM | POA: Diagnosis present

## 2020-07-27 NOTE — Patient Instructions (Signed)
Referred pain from SI joint can go to low back buttock or thigh but not lower than knee

## 2020-07-27 NOTE — Progress Notes (Signed)
Subjective:    Patient ID: Kathleen [name], female    DOB: 1950/12/11, 70 y.o.   MRN: [id number]  HPI  70 year old female with chronic right-sided low back pain.  She has an average pain of 5-6 out of 10 worsens with bending sitting and activity and standing improves with rest therapy exercise injections.  Relief from meds is fair.  She had a recent trip to [place] but unfortunately was quarantined for about a week due to COVID infection.  She had no severe symptoms however. Right L3-4-5 RF April 1 , 2022  10/10/2019 RIght Sacroiliac radio frequency ablation under fluoroscopic guidance This consists of L5 dorsal ramus radio frequency ablation plus neuro lysis of S1-S2 -S3 lateral branches Pain Inventory Average Pain 5 Pain Right Now 6 My pain is intermittent, dull, stabbing and tingling  In the last 24 hours, has pain interfered with the following? General activity 5 Relation with others 5 Enjoyment of life 5 What TIME of day is your pain at its worst? evening Sleep (in general) Good  Pain is worse with: bending, sitting, inactivity and standing Pain improves with: heat/ice, therapy/exercise, medication and injections Relief from Meds: 5  No family history on file. Social History   Socioeconomic History  . Marital status: Married    Spouse name: Not on file  . Number of children: Not on file  . Years of education: Not on file  . Highest education level: Not on file  Occupational History  . Not on file  Tobacco Use  . Smoking status: Never Smoker  . Smokeless tobacco: Never Used  Substance and Sexual Activity  . Alcohol use: Not on file  . Drug use: Not on file  . Sexual activity: Not on file  Other Topics Concern  . Not on file  Social History Narrative  . Not on file   Social Determinants of Health   Financial Resource Strain: Not on file  Food Insecurity: Not on file  Transportation Needs: Not on file  Physical Activity: Not on file  Stress: Not on file   Social Connections: Not on file   No past surgical history on file. No past surgical history on file. No past medical history on file. BP (!) 152/83   Pulse 61   Temp 98.3 F (36.8 C)   Ht 5\' 3"  (1.6 m)   Wt 113 lb (51.3 kg)   SpO2 97%   BMI 20.02 kg/m   Opioid Risk Score:   Fall Risk Score:  `1  Depression screen PHQ 2/9  Depression screen [facility] 2/9 12/04/2019 07/02/2018 04/01/2018 11/13/2017 09/11/2017 04/02/2017 10/26/2016  Decreased Interest 0 0 0 0 0 0 0  Down, Depressed, Hopeless 0 0 0 0 0 0 0  PHQ - 2 Score 0 0 0 0 0 0 0      Review of Systems  Constitutional: Negative.   HENT: Negative.   Eyes: Negative.   Respiratory: Negative.   Cardiovascular: Negative.   Gastrointestinal: Negative.   Endocrine: Negative.   Genitourinary: Negative.   Musculoskeletal: Positive for back pain and gait problem.       LEFT KNEE PAIN  Allergic/Immunologic: Negative.   Hematological: Negative.   Psychiatric/Behavioral: Negative.        Objective:   Physical Exam Vitals and nursing note reviewed.  Constitutional:      Appearance: She is normal weight.  HENT:     Mouth/Throat:     Mouth: Mucous membranes are dry.  Eyes:  Extraocular Movements: Extraocular movements intact.     Conjunctiva/sclera: Conjunctivae normal.     Pupils: Pupils are equal, round, and reactive to light.  Cardiovascular:     Rate and Rhythm: Normal rate and regular rhythm.     Heart sounds: Normal heart sounds.  Pulmonary:     Effort: Pulmonary effort is normal.     Breath sounds: Normal breath sounds.  Abdominal:     General: Abdomen is flat. Bowel sounds are normal.     Palpations: Abdomen is soft.  Musculoskeletal:     Comments: Tenderness right PSIS area    FABER's: Positive SI right Distraction (supine): Negative Thigh thrust test: Positive SI right  Skin:    General: Skin is warm and dry.  Neurological:     Mental Status: She is alert and oriented to person, place, and time.      Gait: Gait normal.  Psychiatric:        Mood and Affect: Mood normal.        Behavior: Behavior normal.           Assessment & Plan:  #1.  History of chronic right-sided low back pain pain appears to be most consistent with her sacroiliac disorder pain is in the PSIS area below L5 has some radiating pattern into the thigh. We discussed that she is approximately 1 year post radiofrequency neurotomy of the right sacroiliac joint and this is likely wearing off, we will schedule for repeat procedure.  Her pain persists despite physical therapy as well as OTC medications.

## 2020-08-03 ENCOUNTER — Encounter: Payer: Medicare Other | Attending: Physical Medicine & Rehabilitation | Admitting: Psychology

## 2020-08-03 ENCOUNTER — Other Ambulatory Visit: Payer: Self-pay

## 2020-08-03 ENCOUNTER — Encounter: Payer: Self-pay | Admitting: Psychology

## 2020-08-03 DIAGNOSIS — M533 Sacrococcygeal disorders, not elsewhere classified: Secondary | ICD-10-CM | POA: Diagnosis present

## 2020-08-03 DIAGNOSIS — G8929 Other chronic pain: Secondary | ICD-10-CM | POA: Diagnosis present

## 2020-08-03 NOTE — Progress Notes (Signed)
Type of treatment:    Health Behavioral Intervention  Visit Number:            4 Start:                          2:00 PM                            End:                           3:00 PM   Subjective:    Patient ID: Kathleen [name] is a 70 y.o. female.  DOB: [id number]    MRN: [id number]  Chronic pain and negative impact on mood and behavior.   Kathleen [name] is a 70 y.o. female referred by Dr. Wynn Banker (Physiatry) for health behavioral intervention in the context lumbar spondylosis without myelopathy as well as sacroiliac pain affecting primarily the right side. She has an average pain of 5-6 out of 10 worsens with bending sitting and activity and standing improves with rest therapy exercise injections.  Relief from meds is fair. Recently returned from trip to [place]. Forced to quarantine ~ week due to COVID infection; w/o severe symptoms.   Right L3-4-5 RF April 1 , 2022   The following portions of the patient's history were reviewed and updated as appropriate: allergies, current medications, past family history, past medical history, past social history, past surgical history, and problem list.  Previous Visit (05/27/20): Rates pain as 4 or 5 out of 10. Excited to go on vacation with friends in May but worries about completing all things on "to do list" and feeling adequately prepared. Mentions feeling more restless at night this past week and having trouble falling back to sleep after waking up early; thinking about things to do before her trip.   Admits that pain predominates thoughts and conversations with husband and friends (~40-50%). States that pain makes her feel physically and mentally tired towards the end of the day (e.g., 3pm-on). Pain interferes with "body, mind, and soul" and causes distress. She describes having trouble requesting help from others as she "never knows how they are going to respond". If a friend invites her for a hike or another physical activity, she worries  about the distance and level of difficulty, even after being reassured its within her comfort zone.   Wants to learn how to live with pain more effectively and grow spiritually. Has not been practicing diaphragmatic breathing technique. Forgot recommended time ratio for exhale:inhale (e.g., in for 3-4 seconds, hold for 3-4, exhale for 6-8).   Objective:  Physical Exam Psychiatric:        Attention and Perception: Attention and perception normal.        Mood and Affect: Mood is anxious. Depressed: mild.       Speech: Speech normal.        Behavior: Behavior is slowed. Behavior is cooperative.        Thought Content: Thought content normal.        Cognition and Memory: Cognition and memory normal.        Judgment: Judgment normal.    Lab Review:  not applicable  Assessment:   Chronic right sacroiliac pain  Intervention: Mindfulness based CBT   Participation: Active   Response/Effectiveness: Good and appropriate    Therapist Response: Set agenda. Mood and pain check/assessment. Reviewed  therapy goals and plan. Reviewed and discussed homework. Reviewed diaphragmatic breathing and progressive muscle relaxation. Provided CBT based chronic pain self-management resources.   Informed patient that I will be resigning from the neuropsychology position effective 08/06/20 and discussed opportunity to follow-up with Dr. Kieth Brightly or other qualified provider to continue health behavioral intervention.   Plan:   Patient will follow-up with Dr. Kieth Brightly on 09/15/20 for initial consultation and treatment planning   Billing/Service Summary: [id number] (Health behavior intervention, individual, face-to-face; initial 30 minutes) x1   [phone] (Health behavior intervention, individual, face-to-face; each additional 15 minutes) x 2

## 2020-08-17 ENCOUNTER — Ambulatory Visit: Payer: Medicare Other | Admitting: Psychology

## 2020-08-31 ENCOUNTER — Ambulatory Visit: Payer: Medicare Other | Admitting: Psychology

## 2020-09-09 ENCOUNTER — Encounter: Payer: Medicare Other | Attending: Physical Medicine & Rehabilitation | Admitting: Physical Medicine & Rehabilitation

## 2020-09-09 ENCOUNTER — Encounter: Payer: Self-pay | Admitting: Physical Medicine & Rehabilitation

## 2020-09-09 ENCOUNTER — Other Ambulatory Visit: Payer: Self-pay

## 2020-09-09 VITALS — BP 163/83 | HR 66 | Temp 98.0°F | Ht 63.0 in | Wt 112.8 lb

## 2020-09-09 DIAGNOSIS — G8929 Other chronic pain: Secondary | ICD-10-CM | POA: Insufficient documentation

## 2020-09-09 DIAGNOSIS — M533 Sacrococcygeal disorders, not elsewhere classified: Secondary | ICD-10-CM | POA: Insufficient documentation

## 2020-09-09 NOTE — Patient Instructions (Signed)
You had a radio frequency procedure today This was done to alleviate joint pain in your lumbar area We injected lidocaine which is a local anesthetic.  You may experience soreness at the injection sites. You may also experienced some irritation of the nerves that were heated I'm recommending ice for 30 minutes every 2 hours as needed for the next 24-48 hours   

## 2020-09-09 NOTE — Progress Notes (Signed)
RIGHT Sacroiliac radio frequency Neurotomy  under fluoroscopic guidance This consists of  L5 dorsal ramus , S1,2,3 lateral branch radiofrequency Neurotomy    Indication is sacroiliac pain which has improved temporarily  by at least 50% after Intraarticular sacroiliac and/or L5 DR, S1.2.3 lateral branch blocks under fluoroscopic guidance Pain interferes with self-care and mobility and has failed to respond to conservative measures.  Informed consent was obtained after discussing risks and benefits of the procedure with the patient these include bleeding bruising and infection temporary or permanent paralysis. The patient elects to proceed and has given written consent.  Patient placed prone on fluoroscopy table. Area marked and prepped with Betadine. Fluoroscopic images utilized to guide needle. 25-gauge 1.5 inch needle was used to anesthetize 5 injection points with 2 cc of 1% lidocaine each. Then a 18-gauge 10 cm RF needle with a 10 mm curved active tip was inserted under fluoroscopic guidance first targeting the S1 SA P./sacral ala junction, bone contact made and confirmed with lateral imaging.  motor stimulation at 2 Hz confirm proper needle location followed by injection of 1ml of 2% lidocaine MPF. imaging.  Motor stimulation at 2 Hz confirmed proper needle location followed by injection of one ML of the2% lidocaine solution. Radio frequency 80C for 90 seconds was performed. Then the infero- lateral aspect of the S1, S2 and lateral aspect of S3 sacral foramina were targeted. Bone contact made.  Motor stim at 2 Hz confirmed proper needle location. One ML of the 2% lidocaine solution was injected into each of 3 sites and radio frequency ablation 80C for 90 seconds was performed. Patient tolerated procedure well. Post procedure instructions given  

## 2020-09-09 NOTE — Progress Notes (Signed)
  PROCEDURE RECORD [facility]   Name: Kathleen [name] DOB:[birth year] MRN: [id number]  Date:09/09/2020  Physician: Claudette Laws, MD    Nurse/CMA: Charise Carwin MA  Allergies:  Allergies  Allergen Reactions   Clindamycin/Lincomycin Nausea Only    Consent Signed: Yes.    Is patient diabetic? No.  CBG today? N/A  Pregnant: No. LMP: No LMP recorded. Patient is postmenopausal. (age 68-55)  Anticoagulants: no Anti-inflammatory: no Antibiotics: no  Procedure: RIght Sacroiliac radio frequencyPosition: Prone Start Time: 11:18 AM  End Time: 11:39 AM  Fluoro Time: 33  RN/CMA Parker MA Parker MA Parker MA   Time 11:04 AM 11:42 12:00 PM   BP 137/83 165/104 173/95   Pulse 71 61    Respirations 16 16    O2 Sat 96 95    S/S 6 6    Pain Level 5 0/10     D/C home with husband, patient A & O X 3, D/C instructions reviewed, and sits independently.

## 2020-09-14 ENCOUNTER — Ambulatory Visit: Payer: Medicare Other | Admitting: Psychology

## 2020-09-15 ENCOUNTER — Ambulatory Visit: Payer: Medicare Other | Admitting: Psychology

## 2020-09-20 LAB — HM MAMMOGRAPHY

## 2020-09-20 LAB — HM DEXA SCAN

## 2020-09-28 ENCOUNTER — Ambulatory Visit: Payer: Medicare Other | Admitting: Psychology

## 2020-11-10 IMAGING — MG MM DIGITAL DIAGNOSTIC UNILAT*R* W/ TOMO W/ CAD
4 series · 4 of 12 positions shown · non-contrast
Comparison: Previous exam(s).

CLINICAL DATA: Possible asymmetry in the slightly outer right
breast in the craniocaudal projection of a recent screening
mammogram.

EXAM:
DIGITAL DIAGNOSTIC UNILATERAL RIGHT MAMMOGRAM WITH TOMO AND CAD

[R CC synth-2D]
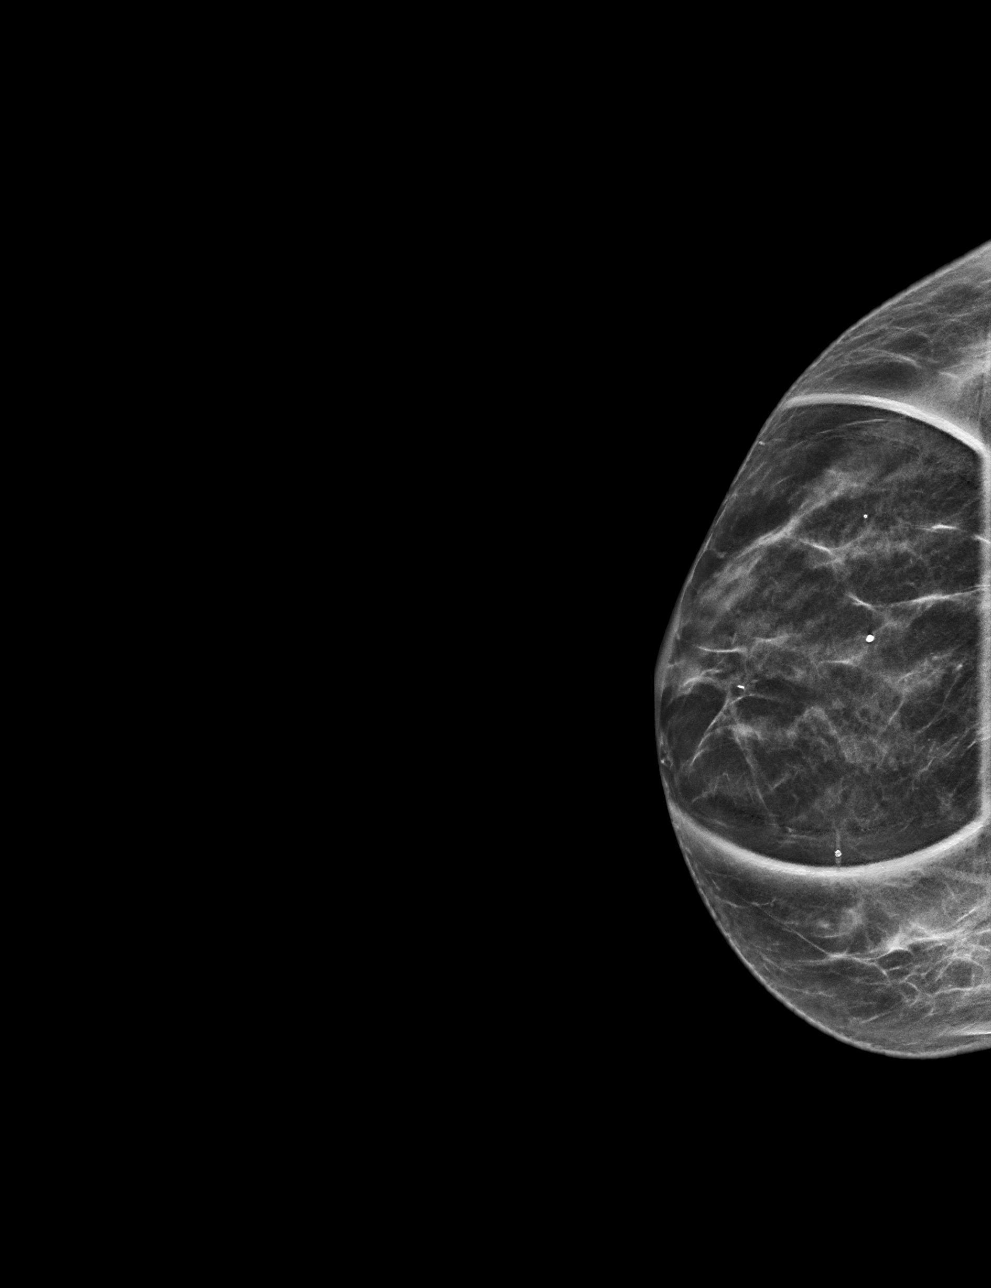

[R ML synth-2D]
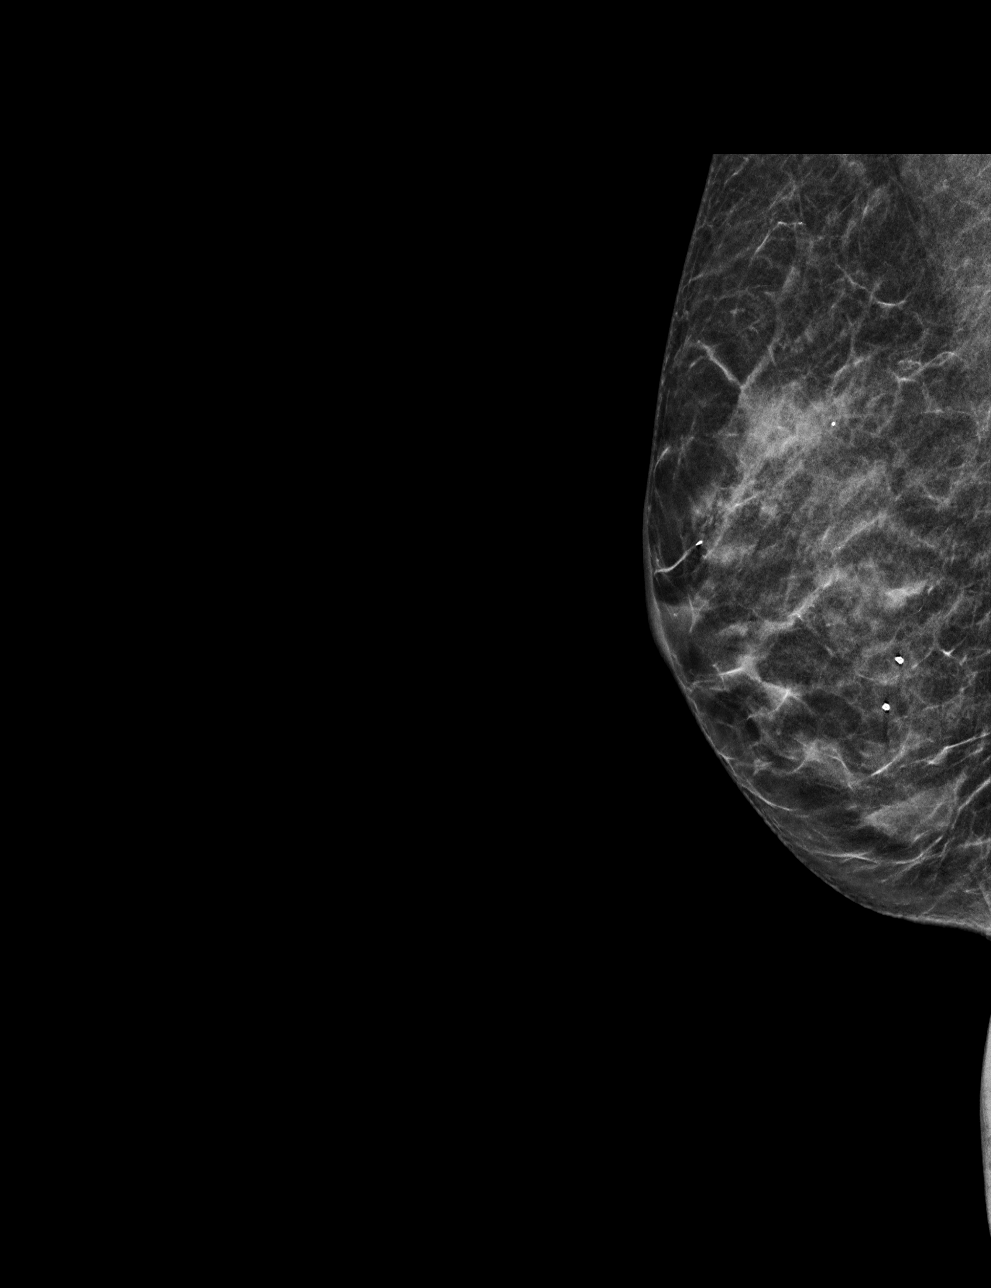

[R ML tomo · tomo slice 21/42.0]
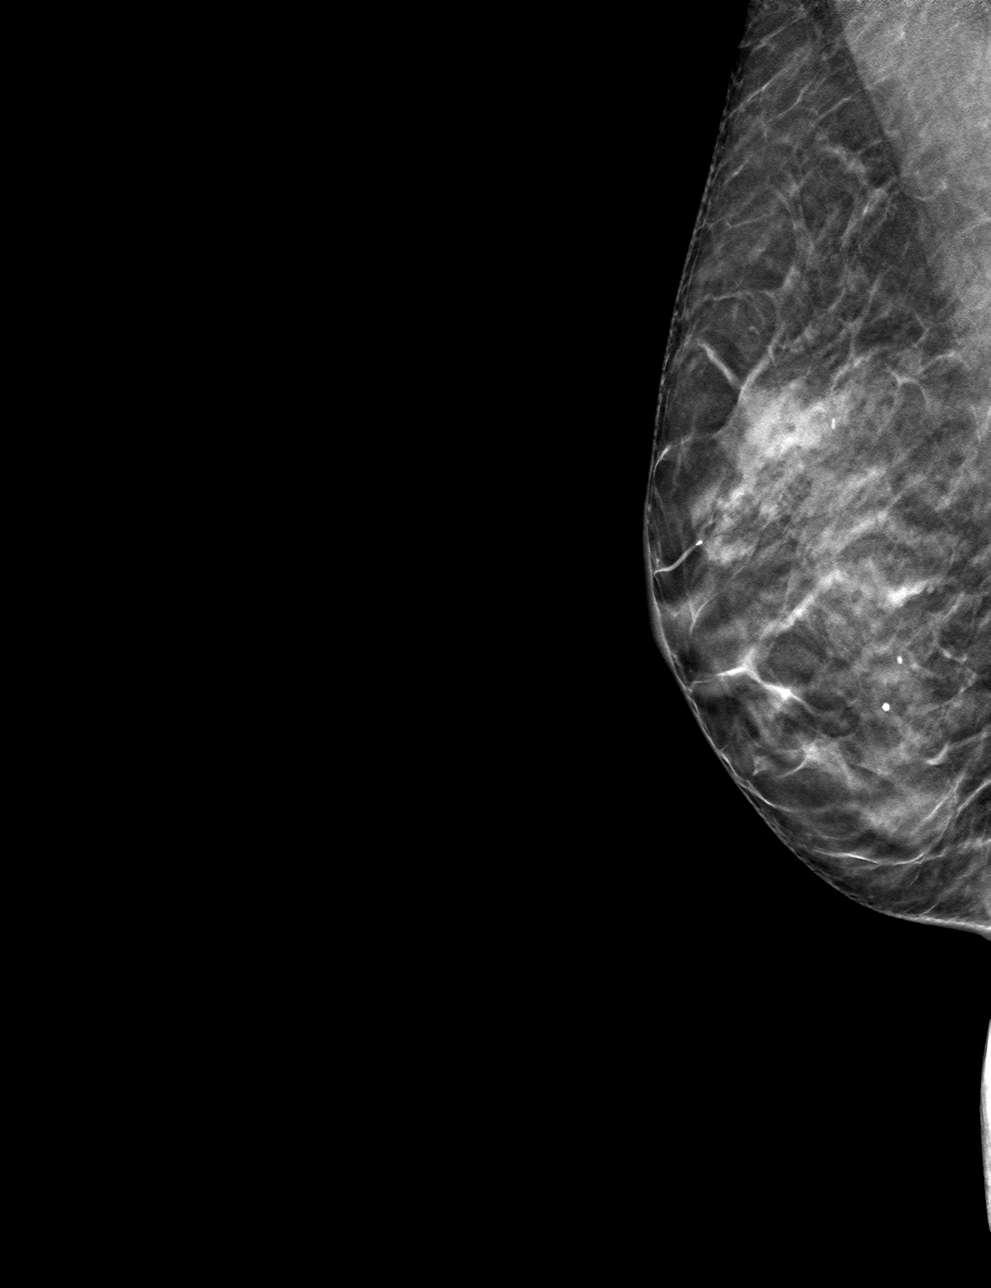

[R CC tomo · tomo slice 23/44.0]
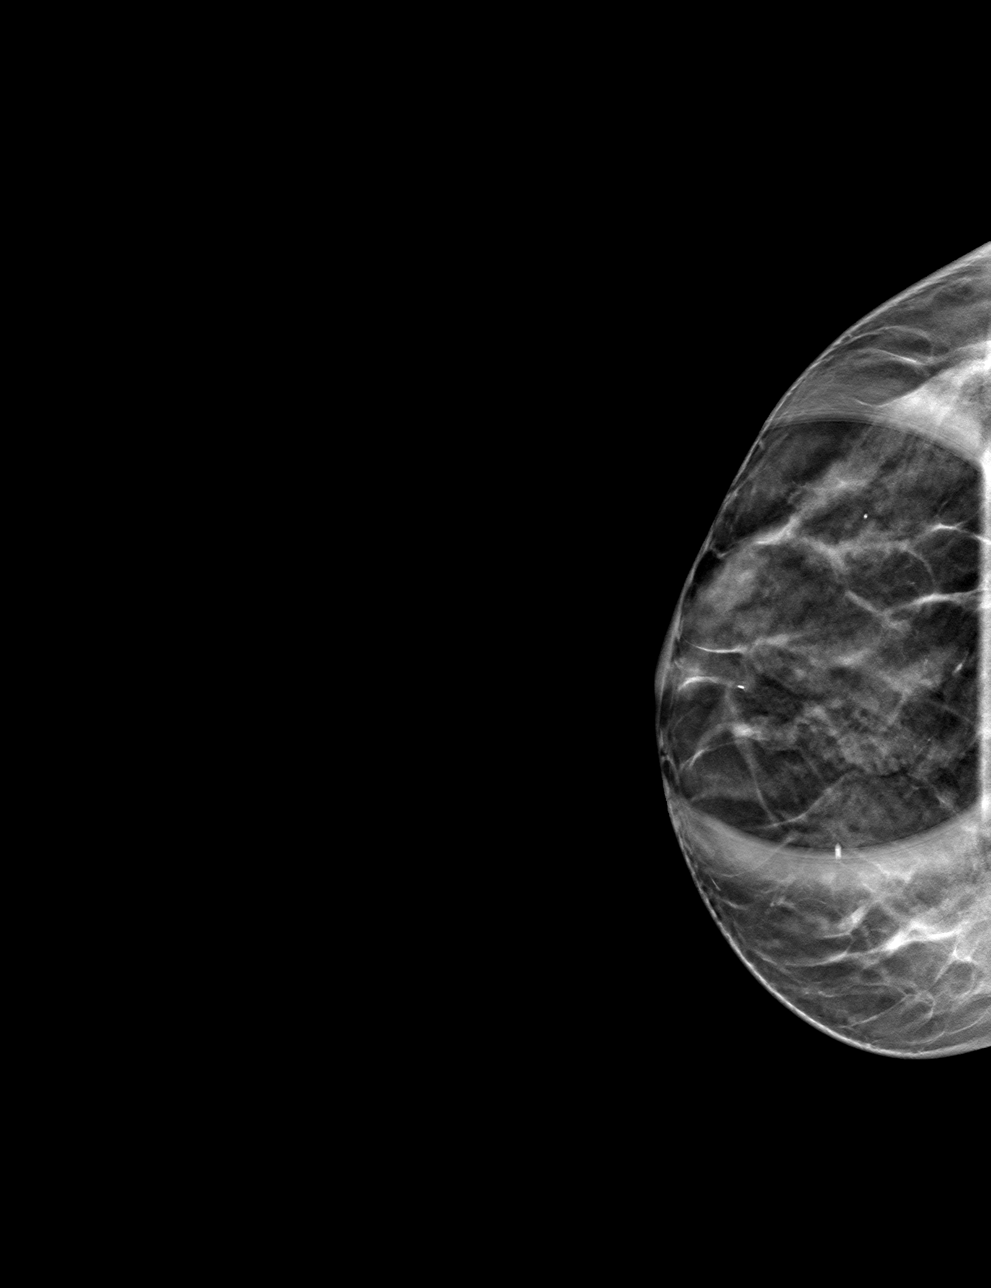

[4 of 12 positions shown; findings below may reference images not displayed]

ACR Breast Density Category c: The breast tissue is heterogeneously
dense, which may obscure small masses.
FINDINGS: 3D tomographic and 2D generated true lateral and spot compression
craniocaudal views of the right breast were obtained. These
demonstrate normal appearing breast tissue at the location of
recently suspected asymmetry, unchanged compared previous
examinations.

Mammographic images were processed with CAD.
IMPRESSION: No evidence of malignancy. The recently suspected right breast
asymmetry was close apposition of normal breast tissue.

RECOMMENDATION:
Bilateral screening mammogram in 1 year.

I have discussed the findings and recommendations with the patient.
If applicable, a reminder letter will be sent to the patient
regarding the next appointment.

BI-RADS CATEGORY  1: Negative.

## 2020-11-25 ENCOUNTER — Encounter: Payer: Self-pay | Admitting: Physical Medicine & Rehabilitation

## 2020-11-25 ENCOUNTER — Other Ambulatory Visit: Payer: Self-pay

## 2020-11-25 ENCOUNTER — Encounter: Payer: Medicare Other | Attending: Physical Medicine & Rehabilitation | Admitting: Physical Medicine & Rehabilitation

## 2020-11-25 VITALS — BP 150/82 | HR 66 | Temp 98.0°F | Ht 63.0 in | Wt 115.0 lb

## 2020-11-25 DIAGNOSIS — M533 Sacrococcygeal disorders, not elsewhere classified: Secondary | ICD-10-CM | POA: Insufficient documentation

## 2020-11-25 DIAGNOSIS — G8929 Other chronic pain: Secondary | ICD-10-CM | POA: Diagnosis not present

## 2020-11-25 DIAGNOSIS — M47816 Spondylosis without myelopathy or radiculopathy, lumbar region: Secondary | ICD-10-CM | POA: Insufficient documentation

## 2020-11-25 NOTE — Patient Instructions (Signed)
   Please call if Repeat Lumbar RF or Medial branch blocks are needed

## 2020-11-25 NOTE — Progress Notes (Signed)
Subjective:    Patient ID: Kathleen [name], female    DOB: 1950/05/20, 70 y.o.   MRN: [id number] Right L3-4-5 RF April 1 , 2022 HPI Chief complaint is right-sided low back pain 70 year old female with history of both sacroiliac disorder as well as lumbar facet disorder on the right side.  She has had positive diagnostic blocks in both of these areas.  She has undergone lumbar radiofrequency L3-L4-L5 on the right side in April 2022 Right sacroiliac RF had post procedure pain at least 1 month, still has some pain in the upper buttock area.  Hiked mountains with a back pack, with sore spot after hiking ~43mi  Overall functional status is independent with frequent exercise.  She continues to go to physical therapy.   Pain Inventory Average Pain 6 Pain Right Now 4 My pain is sharp, stabbing, and aching  In the last 24 hours, has pain interfered with the following? General activity 5 Relation with others 5 Enjoyment of life 5 What TIME of day is your pain at its worst? evening Sleep (in general) Good  Pain is worse with: bending, sitting, inactivity, and standing Pain improves with: rest, heat/ice, therapy/exercise, pacing activities, and medication Relief from Meds: 7  No family history on file. Social History   Socioeconomic History   Marital status: Married    Spouse name: Not on file   Number of children: Not on file   Years of education: Not on file   Highest education level: Not on file  Occupational History   Not on file  Tobacco Use   Smoking status: Never   Smokeless tobacco: Never  Substance and Sexual Activity   Alcohol use: Not on file   Drug use: Not on file   Sexual activity: Not on file  Other Topics Concern   Not on file  Social History Narrative   Not on file   Social Determinants of Health   Financial Resource Strain: Not on file  Food Insecurity: Not on file  Transportation Needs: Not on file  Physical Activity: Not on file  Stress: Not on file   Social Connections: Not on file   No past surgical history on file. No past surgical history on file. No past medical history on file. BP (!) 150/82   Pulse 66   Temp 98 F (36.7 C) (Oral)   Ht 5\' 3"  (1.6 m)   Wt 115 lb (52.2 kg)   SpO2 97%   BMI 20.37 kg/m   Opioid Risk Score:   Fall Risk Score:  `1  Depression screen PHQ 2/9  Depression screen [facility] 2/9 11/25/2020 09/09/2020 12/04/2019 07/02/2018 04/01/2018 11/13/2017 09/11/2017  Decreased Interest 0 0 0 0 0 0 0  Down, Depressed, Hopeless 0 0 0 0 0 0 0  PHQ - 2 Score 0 0 0 0 0 0 0      Review of Systems  Musculoskeletal:  Positive for back pain.       Right knee pain  All other systems reviewed and are negative.     Objective:   Physical Exam Vitals and nursing note reviewed.  Constitutional:      Appearance: She is normal weight.  HENT:     Head: Normocephalic and atraumatic.  Eyes:     Extraocular Movements: Extraocular movements intact.     Conjunctiva/sclera: Conjunctivae normal.     Pupils: Pupils are equal, round, and reactive to light.  Musculoskeletal:     Comments: Left FABER is positive in the  groin area, right Faber's positive in the SI area Negative straight leg raise bilaterally Negative thigh thrust bilaterally Negative distraction test bilaterally Tenderness palpation right L4-L5 paraspinal as well as right PSIS region  Pain with lumbar bending towards the right side and relief with bending towards the left side.  Also pain with twisting on the right side in the lower lumbar area no pain with lumbar flexion full range of motion  Skin:    General: Skin is warm and dry.  Neurological:     Mental Status: She is alert and oriented to person, place, and time.     Gait: Gait normal.     Comments: Motor strength is 5/5 bilateral hip flexor knee extensor ankle dorsiflexor and plantar flexor  Psychiatric:        Mood and Affect: Mood normal.        Behavior: Behavior normal.          Assessment &  Plan:  1.  Right-sided low back pain she has at least 2 pain generators i.e. sacroiliac disorder as well as lower lumbar facet arthropathy.  She has pain with provocative maneuvers i.e. bending towards the right sacroiliac bending toward the left side.  Sacroiliac provocative test positive on the with right Faber's. Overall this appears to be more lumbar facet related in terms of current complaints.  We discussed that further diagnostic certainty can be achieved with repeat L3-L4-L5 medial branch blocks but certainly timeframe is about right for the right L3-L4-L5 Rx to be wearing off.  Patient will call to schedule if pain increases  Over half of the 35 min visit was spent counseling and coordinating care.  We discussed treatment options at length as well as discussing anatomy of the region.  Discussed other treatment options for sacroiliac pain including sacroiliac fusion    Hold off on further Sacroiliac RF

## 2020-12-07 ENCOUNTER — Other Ambulatory Visit: Payer: Self-pay | Admitting: Sports Medicine

## 2020-12-07 DIAGNOSIS — M47816 Spondylosis without myelopathy or radiculopathy, lumbar region: Secondary | ICD-10-CM

## 2020-12-20 ENCOUNTER — Other Ambulatory Visit: Payer: Self-pay | Admitting: Sports Medicine

## 2020-12-20 DIAGNOSIS — M47816 Spondylosis without myelopathy or radiculopathy, lumbar region: Secondary | ICD-10-CM

## 2021-02-27 DIAGNOSIS — R030 Elevated blood-pressure reading, without diagnosis of hypertension: Secondary | ICD-10-CM | POA: Insufficient documentation

## 2021-02-27 DIAGNOSIS — I10 Essential (primary) hypertension: Secondary | ICD-10-CM | POA: Insufficient documentation

## 2021-03-14 ENCOUNTER — Other Ambulatory Visit: Payer: Self-pay | Admitting: Sports Medicine

## 2021-03-29 ENCOUNTER — Other Ambulatory Visit: Payer: Self-pay | Admitting: Sports Medicine

## 2021-04-06 NOTE — Progress Notes (Addendum)
Office Visit Note  Patient: Kathleen [name]             Date of Birth: February 18, 1951           MRN: [id number]             PCP: Loyal Jacobson, MD Referring: Kennyth [place], [place] Visit Date: 04/07/2021 Occupation: Former youth minister  Subjective:  New Patient (Initial Visit) (Transferring care from Dr. Ancil Linsey, who moved out of the area)   History of Present Illness: Kathleen [name] is a 71 y.o. female here for sjogren's syndrome on hydroxycloroquine 200 mg daily and cevimeline TID for dry mouth. She previously saw Dr. Ancil Linsey for this problem.  She was originally diagnosed after establishing care with him in 2016 with existing problems of chronic arthritis affecting facet joints knees and hands.  Work-up revealed a positive ANA with significant eye and mouth dryness symptoms no specific extractable nuclear antibodies identified.  She was not sure since a specific time point her symptoms developed somewhat progressive over preceding years.  She was started on hydroxychloroquine with partial symptom improvement as well as continuing topical diclofenac, oral meloxicam, gabapentin, and Cymbalta for chronic degenerative joint pain especially from the back.  Symptoms in her fingers and knees are persistent but not severely limiting of activity.  She has noticed some progression of the finger deformities over the past several years but usually without acute swelling redness or warmth. For her dry mouth symptoms she uses a Biotene spray drinks frequently and xylitol oral lozenges.  She takes cevimeline 3 times daily with a good benefit and no significant intolerance.  She denies any pain or swelling around major salivary glands or cervical lymphadenopathy. For dry eyes she uses Systane drops also using Restasis drops in both eyes twice daily with a good benefit.  She never experiences any periorbital swelling has no previous major abrasions or other complications.  She has regular follow-up with her eye  Dr. Billie Ruddy at [facility] most recent in August 2022 with normal OCT testing and annual follow-up.  She most recently was started on a trial of Tyrvaya nasal spray she is not sure if there was a large change in symptoms but also did not continue the medicine for a long time due to lack of follow-up and finishing the sample amount.   Labs reviewed 06/2017 SSA neg SSB neg  12/2014 ANA 1:160 homogenous  Activities of Daily Living:  Patient reports morning stiffness for 30 minutes.   Patient Reports nocturnal pain.  Difficulty dressing/grooming: Denies Difficulty climbing stairs: Denies Difficulty getting out of chair: Denies Difficulty using hands for taps, buttons, cutlery, and/or writing: Reports  Review of Systems  Constitutional:  Positive for fatigue.  HENT:  Positive for mouth dryness.   Eyes:  Positive for dryness.  Respiratory:  Negative for shortness of breath.   Cardiovascular:  Negative for swelling in legs/feet.  Gastrointestinal:  Positive for constipation.  Endocrine: Negative for increased urination.  Genitourinary:  Negative for difficulty urinating.  Musculoskeletal:  Positive for joint pain, joint pain and morning stiffness.  Skin:  Negative for rash.  Allergic/Immunologic: Negative for susceptible to infections.  Neurological:  Negative for numbness.  Hematological:  Negative for bruising/bleeding tendency.  Psychiatric/Behavioral:  Negative for sleep disturbance.    PMFS History:  Patient Active Problem List   Diagnosis Date Noted   Sjogren's syndrome (HCC) 04/07/2021   Osteoarthritis of hands, bilateral 04/07/2021   Elevated blood pressure reading in office without diagnosis  of hypertension 02/27/2021   Chronic right sacroiliac pain 09/09/2020   Osteoporosis 11/29/2018   Piriformis syndrome of right side 10/23/2017   Spondylosis without myelopathy or radiculopathy, 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   Chronic myofascial pain 04/02/2017   ANA positive  01/25/2017   Post-nasal drip 07/05/2016   High risk medication use 05/30/2016   Ingrown left big toenail, onychomycosis, and left second paronychia 12/06/2015   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   Xerostomia 05/03/2015   Acid reflux 04/01/2015   IBS (irritable bowel syndrome) 04/01/2015   Insomnia 04/01/2015   Allergy to pollen 02/04/2015   Anterior epistaxis 02/04/2015   Chronic seasonal allergic rhinitis due to pollen 02/04/2015   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   Tinnitus 02/04/2015   Patellar tendinosis of left knee 12/15/2014   Multifactorial low back pain 12/15/2014   Plantar fasciitis, left 12/15/2014   Personal history of other malignant neoplasm of skin 12/08/2011   Chronic back pain 08/24/2011   Knee pain 08/24/2011   Hypothyroidism 02/28/1995    Past Medical History:  Diagnosis Date   Acid reflux    Hypothyroid    Osteoarthritis    Sjogren's syndrome (HCC)     Family History  Problem Relation Age of Onset   Osteoarthritis Mother    Hypertension Father    Past Surgical History:  Procedure Laterality Date   KNEE SURGERY Left    Social History   Social History Narrative   Not on file   Immunization History  Administered Date(s) Administered   PFIZER Comirnaty(Gray Top)Covid-19 Tri-Sucrose Vaccine 03/24/2019, 04/14/2019, 12/15/2019, 06/14/2020   Pfizer Covid-19 Vaccine Bivalent Booster 71yrs & up 12/09/2020     Objective: Vital Signs: BP (!) 161/86 (BP Location: Right Arm, Patient Position: Sitting, Cuff Size: Normal)    Pulse 64    Resp 15    Ht 5\' 3"  (1.6 m)    Wt 111 lb (50.3 kg)    BMI 19.66 kg/m    Physical Exam HENT:     Right Ear: External ear normal.     Left Ear: External ear normal.     Mouth/Throat:     Mouth: Mucous membranes are moist.     Pharynx: Oropharynx is clear.  Eyes:     Conjunctiva/sclera: Conjunctivae normal.  Cardiovascular:     Rate and Rhythm: Normal rate and regular rhythm.  Pulmonary:     Effort:  Pulmonary effort is normal.     Breath sounds: Normal breath sounds.  Musculoskeletal:     Right lower leg: No edema.     Left lower leg: No edema.  Skin:    General: Skin is warm and dry.     Findings: No rash.  Neurological:     General: No focal deficit present.     Mental Status: She is alert.  Psychiatric:        Mood and Affect: Mood normal.     Musculoskeletal Exam:  Neck full ROM no tenderness Shoulders full ROM no tenderness or swelling Elbows full ROM no tenderness or swelling Wrists full ROM no tenderness or swelling Heberdon's nodes of bilateral PIP and DIP joints, PIP worst in right hand, bilateral thumb OA changes, no palpable synovitis, ROM nearly normal Knees full ROM no tenderness or swelling Ankles full ROM no tenderness or swelling   Investigation: No additional findings.  Imaging: No results found.  Recent Labs: Lab Results  Component Value Date   NA 136 05/05/2016   K 4.2 05/05/2016   CL  102 05/05/2016   CO2 27 05/05/2016   GLUCOSE 89 05/05/2016   BUN 18 05/05/2016   CREATININE 0.73 05/05/2016   BILITOT 0.3 05/05/2016   ALKPHOS 51 05/05/2016   AST 22 05/05/2016   ALT 17 05/05/2016   PROT 6.2 05/05/2016   ALBUMIN 4.5 05/05/2016   CALCIUM 9.2 05/05/2016    Speciality Comments: No specialty comments available.  Procedures:  No procedures performed Allergies: Clindamycin/lincomycin   Assessment / Plan:     Visit Diagnoses: Sjogren's syndrome without extraglandular involvement (HCC) - Plan: Sjogrens syndrome-A extractable nuclear antibody, Sjogrens syndrome-B extractable nuclear antibody, C3 and C4, Rheumatoid factor, Sedimentation rate, hydroxychloroquine (PLAQUENIL) 200 MG tablet, cevimeline (EVOXAC) 30 MG capsule, cycloSPORINE (RESTASIS) 0.05 % ophthalmic emulsion  Seronegative Sjogren syndrome but history and symptoms are highly consistent with positive ANA.  I agree with previous recommendations by Dr. Ancil Linsey not pursuing minor salivary  gland biopsy due to risk and low utility.  I will repeat antibody test today also checking sed rate, complements, rheumatoid factor for prognostic purposes.  Plan to continue the current treatments with hydroxychloroquine 200 mg daily cevimeline 30 mg 3 times daily, Restasis OU twice daily. Symptoms have not been adequately controlled with OTC artificial tear treatments with persistent daily irritation. She was actually recommended to start additional prescription treatment with Tyrvaya. I will review the information I am not very familiar with the Tyrvaya treatment.  She is also continuing her ongoing over-the-counter symptom treatments.  Age-related osteoporosis without current pathological fracture  On Reclast infusions since 2020 taking vitamin D and calcium supplementation no known fractures or prior treatments.  High risk medication use  She has ongoing follow-up for long-term use of hydroxychloroquine most recent exam in August 2022 looked fine.  Chronic right sacroiliac pain Chronic myofascial pain  On long-term treatment including as needed oral anti-inflammatories and some neuropathic pain treatments symptoms are stable at this time and she has follow-up with her pain management.  Primary osteoarthritis of both hands  Bilateral hand OA no concerning inflammatory changes present at this time.  Suspect symptoms are more OA than Sjogren's related arthralgia.  Orders: Orders Placed This Encounter  Procedures   Sjogrens syndrome-A extractable nuclear antibody   Sjogrens syndrome-B extractable nuclear antibody   C3 and C4   Rheumatoid factor   Sedimentation rate   Meds ordered this encounter  Medications   hydroxychloroquine (PLAQUENIL) 200 MG tablet    Sig: Take 1 tablet (200 mg total) by mouth daily.    Dispense:  90 tablet    Refill:  1   cevimeline (EVOXAC) 30 MG capsule    Sig: Take 1 capsule (30 mg total) by mouth 3 (three) times daily.    Dispense:  270 capsule     Refill:  1   cycloSPORINE (RESTASIS) 0.05 % ophthalmic emulsion    Sig: Place 1 drop into both eyes 2 (two) times daily.    Dispense:  90 each    Refill:  0     Follow-Up Instructions: Return in about 6 months (around 10/05/2021) for pSS on HCQ/cevimeline f/u 77mos.   Fuller Plan, MD  Note - This record has been created using [employer].  Chart creation errors have been sought, but may not always  have been located. Such creation errors do not reflect on  the standard of medical care.

## 2021-04-07 ENCOUNTER — Ambulatory Visit (INDEPENDENT_AMBULATORY_CARE_PROVIDER_SITE_OTHER): Payer: Medicare Other | Admitting: Internal Medicine

## 2021-04-07 ENCOUNTER — Telehealth: Payer: Self-pay | Admitting: *Deleted

## 2021-04-07 ENCOUNTER — Other Ambulatory Visit: Payer: Self-pay

## 2021-04-07 ENCOUNTER — Encounter: Payer: Self-pay | Admitting: Internal Medicine

## 2021-04-07 VITALS — BP 161/86 | HR 64 | Resp 15 | Ht 63.0 in | Wt 111.0 lb

## 2021-04-07 DIAGNOSIS — M19041 Primary osteoarthritis, right hand: Secondary | ICD-10-CM | POA: Diagnosis not present

## 2021-04-07 DIAGNOSIS — M3500 Sicca syndrome, unspecified: Secondary | ICD-10-CM

## 2021-04-07 DIAGNOSIS — M533 Sacrococcygeal disorders, not elsewhere classified: Secondary | ICD-10-CM

## 2021-04-07 DIAGNOSIS — M19042 Primary osteoarthritis, left hand: Secondary | ICD-10-CM | POA: Diagnosis not present

## 2021-04-07 DIAGNOSIS — M7918 Myalgia, other site: Secondary | ICD-10-CM

## 2021-04-07 DIAGNOSIS — M810 Age-related osteoporosis without current pathological fracture: Secondary | ICD-10-CM | POA: Diagnosis not present

## 2021-04-07 DIAGNOSIS — G8929 Other chronic pain: Secondary | ICD-10-CM

## 2021-04-07 DIAGNOSIS — Z79899 Other long term (current) drug therapy: Secondary | ICD-10-CM

## 2021-04-07 MED ORDER — CYCLOSPORINE 0.05 % OP EMUL: 1.0000 [drp] | Freq: Two times a day (BID) | OPHTHALMIC | 0 refills | Status: DC

## 2021-04-07 MED ORDER — CEVIMELINE HCL 30 MG PO CAPS: 30.0000 mg | ORAL_CAPSULE | Freq: Three times a day (TID) | ORAL | 1 refills | Status: DC

## 2021-04-07 MED ORDER — HYDROXYCHLOROQUINE SULFATE 200 MG PO TABS: 200.0000 mg | ORAL_TABLET | Freq: Every day | ORAL | 1 refills | Status: DC

## 2021-04-07 NOTE — Patient Instructions (Signed)
We will continue the current medications plan. I will look into the nasal spray or can consider asking your ophthalmologist if they have recommendations about this for your dry eyes.  I am checking several blood tests today mostly for prognostic reasons. I am rechecking the SSA antibody test. I am also checking the sedimentation rate test, complement, and rheumatoid factor. These can indicate a higher risk of progression or other organ involvement when seen with sjogren's syndrome, and if normal we won't have to repeatedly test.

## 2021-04-07 NOTE — Telephone Encounter (Signed)
Submitted a Prior Authorization request to [facility] for  Restasis  via CoverMyMeds. Will update once we receive a response.

## 2021-04-08 LAB — C3 AND C4
C3 Complement: 104 mg/dL (ref 83–193)
C4 Complement: 20 mg/dL (ref 15–57)

## 2021-04-08 LAB — SJOGRENS SYNDROME-A EXTRACTABLE NUCLEAR ANTIBODY: SSA (Ro) (ENA) Antibody, IgG: <1 AI

## 2021-04-08 LAB — SEDIMENTATION RATE: Sed Rate: 6 mm/h (ref 0–30)

## 2021-04-08 LAB — SJOGRENS SYNDROME-B EXTRACTABLE NUCLEAR ANTIBODY: SSB (La) (ENA) Antibody, IgG: <1 AI

## 2021-04-08 LAB — RHEUMATOID FACTOR: Rheumatoid fact SerPl-aCnc: <14 IU/mL (ref <14)

## 2021-04-11 NOTE — Progress Notes (Signed)
Lab results look very good she does not have any indicators predictive for long term complications. We can continue the current medications for now. The restasis prescription is not yet re-approved but working on this.

## 2021-04-20 ENCOUNTER — Encounter: Payer: Self-pay | Admitting: *Deleted

## 2021-04-27 ENCOUNTER — Ambulatory Visit: Payer: Medicare Other | Admitting: Internal Medicine

## 2021-04-27 ENCOUNTER — Encounter: Payer: Self-pay | Admitting: *Deleted

## 2021-05-03 ENCOUNTER — Other Ambulatory Visit: Payer: Self-pay

## 2021-05-04 ENCOUNTER — Encounter: Payer: Self-pay | Admitting: Family Medicine

## 2021-05-04 ENCOUNTER — Ambulatory Visit (INDEPENDENT_AMBULATORY_CARE_PROVIDER_SITE_OTHER): Payer: Medicare Other | Admitting: Family Medicine

## 2021-05-04 VITALS — BP 124/66 | HR 65 | Temp 97.4°F | Ht 63.75 in | Wt 113.8 lb

## 2021-05-04 DIAGNOSIS — M810 Age-related osteoporosis without current pathological fracture: Secondary | ICD-10-CM

## 2021-05-04 DIAGNOSIS — M19041 Primary osteoarthritis, right hand: Secondary | ICD-10-CM

## 2021-05-04 DIAGNOSIS — Z1159 Encounter for screening for other viral diseases: Secondary | ICD-10-CM

## 2021-05-04 DIAGNOSIS — M3500 Sicca syndrome, unspecified: Secondary | ICD-10-CM

## 2021-05-04 DIAGNOSIS — E039 Hypothyroidism, unspecified: Secondary | ICD-10-CM | POA: Diagnosis not present

## 2021-05-04 DIAGNOSIS — M19042 Primary osteoarthritis, left hand: Secondary | ICD-10-CM

## 2021-05-04 DIAGNOSIS — M47816 Spondylosis without myelopathy or radiculopathy, lumbar region: Secondary | ICD-10-CM

## 2021-05-04 MED ORDER — MELOXICAM 15 MG PO TABS: 15.0000 mg | ORAL_TABLET | Freq: Every day | ORAL | 3 refills | Status: DC | PRN

## 2021-05-04 NOTE — Progress Notes (Addendum)
?[facility] ?[facility] ?[name] ?[place] [address] ?Dept: [phone] ?Dept Fax: [phone] ? ?New Patient Office Visit ? ?Subjective:  ? ? Patient ID: Kathleen [name], female    DOB: 06/05/1950, 71 y.o..   MRN: [id number] ? ?Chief Complaint  ?Patient presents with  ? Establish Care  ?  NP-establish care.  C/o BP up/downs.     ? ? ?History of Present Illness: ? ?Patient is in today to establish care. Kathleen [name] was born in [place], [place]. She attended [name], majoring in [place]. She moved to [place] about 10 years ago, when she retired. 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 She ahs a daughter (18) living in [place] and a son(40) living in [place]. She has 1 grandson. Kathleen [name] has been married for 48 years. She denies tobacco or drug use and only drinks socially. ? ?Kathleen [name] has a history of hypothyroidism. She is managed on levothyroxine. ? ?Kathleen [name] has a history of osteoporosis. She takes calcium/Vit. D. She is also receiving a yearly infusion of Reclast (zoledronic acid). ? 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 She sees Kathleen [name] (rheumatology). She is managed on hydroxychloroquine. She struggles with dry eye symptoms, so is managed with Alpha lipoic acid, cevimeline (Evoxac), cyclosporine (Restasis), Systane,  and varenicline (Tyrvaya) for these issues.  ? ?Kathleen [name] has a number of osteoarthritic issues, involving the hands and knees. She also has chronic low back issues. She sees Kathleen [name] (physicatry) for pain management. She is on duloxetine, gabapentin, and meloxicam for this. ? ?Kathleen [name] has a history of allergic rhinitis. She is managed on azelastine, Allegra, and Singulair. ? ?Kathleen [name] has a history of insomnia. She is managed on melatonin with Ambien use about once a week. ? ?Past Medical History: ?Patient Active Problem List  ? Diagnosis Date Noted  ?  Sjogren's syndrome (HCC) 04/07/2021  ? Osteoarthritis of hands, bilateral 04/07/2021  ? Elevated blood pressure reading in office without diagnosis of hypertension 02/27/2021  ? Chronic right sacroiliac pain 09/09/2020  ? Osteoporosis 11/29/2018  ? Piriformis syndrome of right side 10/23/2017  ? Spondylosis without myelopathy or radiculopathy, 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  ? Chronic myofascial pain 04/02/2017  ? ANA positive 01/25/2017  ? High risk medication use 05/30/2016  ? Xerostomia 05/03/2015  ? GERD (gastroesophageal reflux disease) 04/01/2015  ? IBS (irritable bowel syndrome) 04/01/2015  ? Insomnia 04/01/2015  ? Chronic seasonal allergic rhinitis due to pollen 02/04/2015  ? 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 Tinnitus 02/04/2015  ? Multifactorial low back pain 12/15/2014  ? Personal history of other malignant neoplasm of skin 12/08/2011  ? Chronic back pain 08/24/2011  ? Hypothyroidism 02/28/1995  ? ?Past Surgical History:  ?Procedure Laterality Date  ? CATARACT EXTRACTION, BILATERAL    ? KNEE ARTHROSCOPY W/ MENISCECTOMY Left   ? ?Family History  ?Problem Relation Age of Onset  ? Stroke Kathleen [name]   ? Heart disease Kathleen [name]   ? Osteoarthritis Kathleen [name]   ? Heart disease Kathleen [name]   ? Hypertension Kathleen [name]   ? Heart disease Kathleen [name]   ? Cancer Kathleen [name]   ?     Lung  ? ?Outpatient Medications Prior to Visit  ?Medication Sig Dispense Refill  ? acetaminophen (TYLENOL) 325 MG tablet Take 650 mg by mouth every 6 (six) hours as needed.    ? Alpha-Lipoic Acid 300 MG CAPS     ? Ascorbic Acid (  VITAMIN C) 100 MG tablet Take 100 mg by mouth daily.    ? azelastine (ASTELIN) 0.1 % nasal spray Place 1 spray into both nostrils 2 (two) times daily.    ? Calcium Carbonate-Vit D-Min (CALTRATE 600+D PLUS MINERALS PO) Take 1 tablet by mouth daily.    ? cevimeline (EVOXAC) 30 MG capsule Take 1 capsule (30 mg total) by mouth 3 (three) times daily. 270 capsule 1  ? cycloSPORINE (RESTASIS) 0.05 % ophthalmic emulsion  Place 1 drop into both eyes 2 (two) times daily. 90 each 0  ? diclofenac sodium (VOLTAREN) 1 % GEL APPLY EXTERNALLY TO THE AFFECTED AREA FOUR TIMES DAILY 300 g 2  ? DULoxetine (CYMBALTA) 30 MG capsule TAKE ONE CAPSULE BY MOUTH EVERY DAY 90 capsule 3  ? fexofenadine (ALLEGRA) 180 MG tablet Take 180 mg by mouth daily.    ? gabapentin (NEURONTIN) 100 MG capsule TAKE 2 CAPSULES BY MOUTH EVERY MORNING AND 2 CAPSULES BY MOUTH AT LUNCH 360 capsule 3  ? gabapentin (NEURONTIN) 600 MG tablet TAKE 1 TABLET(600 MG) BY MOUTH AT BEDTIME 90 tablet 3  ? hydroxychloroquine (PLAQUENIL) 200 MG tablet Take 1 tablet (200 mg total) by mouth daily. 90 tablet 1  ? IMVEXXY MAINTENANCE PACK 4 MCG INST SMARTSIG:1 Vaginal Twice a Week    ? levothyroxine (SYNTHROID) 75 MCG tablet Take 75 mcg by mouth daily before breakfast.    ? Melatonin 5 MG CAPS Take 1 tablet by mouth at bedtime.    ? montelukast (SINGULAIR) 10 MG tablet Take 10 mg by mouth at bedtime.    ? Omega-3 Fatty Acids (FISH OIL) 1000 MG CAPS Take by mouth.    ? omeprazole (PRILOSEC) 20 MG capsule Take 20 mg by mouth daily.    ? polyethylene glycol (MIRALAX) 0.34 gm/ml SOLN     ? Propylene Glycol (SYSTANE BALANCE OP) Apply to eye.    ? SODIUM FLUORIDE 5000 PPM 1.1 % PSTE See admin instructions.    ? Varenicline Tartrate (TYRVAYA) 0.03 MG/ACT SOLN Place into the nose.    ? zolpidem (AMBIEN) 5 MG tablet Take 2.5-5 mg by mouth at bedtime as needed.    ? meloxicam (MOBIC) 15 MG tablet Take 1 tablet (15 mg total) by mouth daily as needed for pain. 90 tablet 3  ? omeprazole (PRILOSEC) 40 MG capsule Take 40 mg by mouth daily. (Patient not taking: Reported on 04/07/2021)    ? ?No facility-administered medications prior to visit.  ? ?Allergies  ?Allergen Reactions  ? Clindamycin/Lincomycin Nausea Only  ?  ?Objective:  ? ?Today's Vitals  ? 05/04/21 1423  ?BP: 124/66  ?Pulse: 65  ?Temp: (!) 97.4 ?F (36.3 ?C)  ?TempSrc: Temporal  ?SpO2: 98%  ?Weight: 113 lb 12.8 oz (51.6 kg)  ?Height: 5' 3.75"  (1.619 m)  ? ?Body mass index is 19.69 kg/m?.  ? ?General: Well developed, well nourished. No acute distress. ?Extremities: There are multiple nodular changes of the DIP and PIP joints, with some  ? deformity of the right 2nd and 3rd finger. ?Psych: Alert and oriented. Normal mood and affect. ? ?Health Maintenance Due  ?Topic Date Due  ? Hepatitis C Screening  Never done  ? MAMMOGRAM  06/03/2015  ? DEXA SCAN  Never done  ?   ?Assessment & Plan:  ? ?1. Acquired hypothyroidism ?Last TSH in Sept. was at goal. I will repeat this today. She will continue levothyroxine. ? ?- TSH ? ?2. Spondylosis without myelopathy or radiculopathy, lumbar region ?Ms. Myer Haff requests  I assume management of her duloxetine, gabapentin, and meloxicam. She is currently stable on her regimen for this. ? ?- meloxicam (MOBIC) 15 MG tablet; Take 1 tablet (15 mg total) by mouth daily as needed for pain.  Dispense: 90 tablet; Refill: 3 ? ?3. Age-related osteoporosis without current pathological fracture ?On yearly Reclast and taking daily calcium/Vit. D. She will check into the status of her last bone density scan. ? ?4. Primary osteoarthritis of both hands ?Discussed the possibility of referral to a hand surgeon Amanda Pea([place]). She wrote his name down to consider. ? ?5. Sjogren's syndrome without extraglandular involvement (HCC) ?Working with Dr. Dimple Caseyice. ? ?6. Encounter for hepatitis C screening test for low risk patient ? ?- HCV Ab w Reflex to Quant PCR ? ?Return in about 3 months (around 08/04/2021) for Reassessment.  ? ?Loyola MastStephen M Kynzlee Hucker, MD ?

## 2021-05-04 NOTE — Telephone Encounter (Signed)
Received notification from [facility] regarding a prior authorization for  Restasis . Authorization has been APPROVED from 05/04/2021 to 05/05/2022.  ? ? ?

## 2021-05-05 ENCOUNTER — Encounter: Payer: Self-pay | Admitting: Family Medicine

## 2021-05-05 LAB — TSH: TSH: 1.28 u[IU]/mL (ref 0.35–5.50)

## 2021-05-05 LAB — HCV INTERPRETATION

## 2021-05-05 LAB — HCV AB W REFLEX TO QUANT PCR: HCV Ab: NONREACTIVE

## 2021-06-15 ENCOUNTER — Encounter: Payer: Self-pay | Admitting: Family Medicine

## 2021-06-15 ENCOUNTER — Ambulatory Visit (INDEPENDENT_AMBULATORY_CARE_PROVIDER_SITE_OTHER): Payer: Medicare Other | Admitting: Family Medicine

## 2021-06-15 VITALS — BP 130/80 | HR 61 | Temp 96.9°F | Ht 63.0 in | Wt 116.6 lb

## 2021-06-15 DIAGNOSIS — J0110 Acute frontal sinusitis, unspecified: Secondary | ICD-10-CM | POA: Diagnosis not present

## 2021-06-15 DIAGNOSIS — R030 Elevated blood-pressure reading, without diagnosis of hypertension: Secondary | ICD-10-CM

## 2021-06-15 MED ORDER — BUDESONIDE 32 MCG/ACT NA SUSP: 2.0000 | Freq: Every day | NASAL | 0 refills | Status: DC

## 2021-06-15 MED ORDER — AMOXICILLIN 500 MG PO CAPS
500.0000 mg | ORAL_CAPSULE | Freq: Three times a day (TID) | ORAL | 0 refills | Status: AC
Start: 2021-06-15 — End: 2021-06-25

## 2021-06-15 NOTE — Patient Instructions (Signed)
Please take amoxicillin as prescribed. ?Use budesonide nasal spray as needed for nasal congestion. ?

## 2021-06-15 NOTE — Progress Notes (Signed)
? ?  Acute Office Visit ? ?Subjective:  ? ?  ?Patient ID: Kathleen [name], female    DOB: 07/21/1950, 71 y.o.   MRN: [id number] ? ?Chief Complaint  ?Patient presents with  ? Acute Visit  ?  Pt c/o elevated bp and congestion with headache started Friday.  ? ? ?HPI ?Patient is in today for ongoing sinus congestion and frontal headaches for about 1 week.  Patient is also noticed elevated blood pressures at home.  Patient has drainage down the back of her throat leading to mild cough.  Headaches are managed with daily meloxicam and as needed Tylenol.  Does have a history of seasonal allergies and chronic allergic rhinitis.  Her allergies are treated with montelukast, Allegra, Astelin.  She has tried Flonase in the past but this did not help that well. Does not have any hearing or ear problems.  Does not have any ongoing fevers.  Denies chest pain or shortness of breath. ? ?Home blood pressures range from systolics in the 140s to the 150s. ? ?ROS ?As per HPI ? ?   ?Objective:  ?  ?BP 130/80 (BP Location: Left Arm, Patient Position: Sitting, Cuff Size: Normal)   Pulse 61   Temp (!) 96.9 ?F (36.1 ?C) (Temporal)   Ht 5\' 3"  (1.6 m)   Wt 116 lb 9.6 oz (52.9 kg)   SpO2 97%   BMI 20.65 kg/m?  ? ?Gen: NAD, resting comfortably ?HEENT: Nares are erythematous and irritated, normal oropharynx, frontal sinuses mildly tender ?CV: RRR with no murmurs appreciated ?Pulm: NWOB, CTAB with no crackles, wheezes, or rhonchi ?Skin: warm, dry ?Neuro: grossly normal, moves all extremities ?Psych: Normal affect and thought content ? ? ?No results found for any visits on 06/15/21. ? ? ?   ?Assessment & Plan:  ?Acute sinusitis ?Given duration of symptoms, possibly worsened by seasonal allergies but these are treated with 3 modalities already ?Trial of amoxicillin and intranasal budesonide ? ?Elevated blood pressures ?At home, normotensive here at the office  ?No red flag symptoms  ?likely compounded by ongoing sinusitis ?Treat sinusitis as  above, follow-up as needed if ongoing elevated blood pressures at home ? ? ?Meds ordered this encounter  ?Medications  ? amoxicillin (AMOXIL) 500 MG capsule  ?  Sig: Take 1 capsule (500 mg total) by mouth 3 (three) times daily for 10 days.  ?  Dispense:  30 capsule  ?  Refill:  0  ? budesonide (EQ BUDESONIDE NASAL) 32 MCG/ACT nasal spray  ?  Sig: Place 2 sprays into both nostrils daily.  ?  Dispense:  1 g  ?  Refill:  0  ? ? ?Return in about 1 week (around 06/22/2021), or if symptoms worsen or fail to improve. ? ?06/24/2021, MD ? ? ?

## 2021-08-08 ENCOUNTER — Ambulatory Visit (INDEPENDENT_AMBULATORY_CARE_PROVIDER_SITE_OTHER): Payer: Medicare Other | Admitting: Family Medicine

## 2021-08-08 VITALS — BP 124/78 | HR 60 | Temp 97.5°F | Ht 63.0 in | Wt 114.4 lb

## 2021-08-08 DIAGNOSIS — M19041 Primary osteoarthritis, right hand: Secondary | ICD-10-CM | POA: Diagnosis not present

## 2021-08-08 DIAGNOSIS — M3500 Sicca syndrome, unspecified: Secondary | ICD-10-CM | POA: Diagnosis not present

## 2021-08-08 DIAGNOSIS — E039 Hypothyroidism, unspecified: Secondary | ICD-10-CM

## 2021-08-08 DIAGNOSIS — I10 Essential (primary) hypertension: Secondary | ICD-10-CM | POA: Diagnosis not present

## 2021-08-08 DIAGNOSIS — M19042 Primary osteoarthritis, left hand: Secondary | ICD-10-CM

## 2021-08-08 MED ORDER — HYDROCHLOROTHIAZIDE 25 MG PO TABS: 25.0000 mg | ORAL_TABLET | Freq: Every day | ORAL | 3 refills | Status: DC

## 2021-08-08 NOTE — Progress Notes (Signed)
[facility] [address] [place] [id number] Dept: [phone] Dept Fax: [phone]  Chronic Care Office Visit  Subjective:    Patient ID: Kathleen [name], female    DOB: 20-Jul-1950, 71 y.o..   MRN: [id number]  Chief Complaint  Patient presents with   Follow-up    3 month f/u. Not fasting.  C/o having brain fog.     History of Present Illness:  Patient is in today for reassessment of chronic medical issues.  [name] has a history of hypothyroidism. She is managed on levothyroxine 75 mcg daily..   [name] has a history of Sjgren's syndrome. She sees Dr. Dimple Caseyice (rheumatology). She is managed on hydroxychloroquine. She struggles with dry eye symptoms, so is managed with Alpha lipoic acid, cevimeline (Evoxac), cyclosporine (Restasis), Systane,  and varenicline (Tyrvaya) for these issues.    [name] has a number of osteoarthritic issues, involving the hands and knees. She also has chronic low back issues. She sees Dr. Doroteo BradfordKirstein (physicatry) for pain management. She is on duloxetine, gabapentin, and meloxicam for this. She has noted an enlarged nodule near the DIP of the right 2nd finger. She has a pending appointment with Dr. Amanda PeaGramig.    [name] has a history of allergic rhinitis. She is managed on azelastine, Allegra, and Singulair. She had a recent sinus infection, but this has improved with treatment.  [name] has a history of an elevated blood pressure. Her Bps in the office have been up and down. She got a home BP cuff and has been monitoring this over the past few months. In reviewing her results, her average BP was 140/82.  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   Chronic myofascial pain 04/02/2017   ANA positive 01/25/2017   High risk medication use 05/30/2016   Xerostomia 05/03/2015   GERD (gastroesophageal reflux disease) 04/01/2015   IBS (irritable bowel syndrome) 04/01/2015   Insomnia 04/01/2015   Chronic seasonal allergic rhinitis due to pollen 02/04/2015   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   Tinnitus 02/04/2015   Multifactorial low back pain 12/15/2014   Personal history of other malignant neoplasm of skin 12/08/2011   Chronic back pain 08/24/2011   Hypothyroidism 02/28/1995   Past Surgical History:  Procedure Laterality Date   CATARACT EXTRACTION, BILATERAL     KNEE ARTHROSCOPY W/ MENISCECTOMY Left    Family History  Problem Relation Age of Onset   Stroke Mother    Heart disease Mother    Osteoarthritis Mother    Heart disease Father    Hypertension Father    Heart disease Paternal Uncle    Cancer Paternal Uncle        Lung   Outpatient Medications Prior to Visit  Medication Sig Dispense Refill   acetaminophen (TYLENOL) 325 MG tablet Take 650 mg by mouth every 6 (six) hours as needed.     Alpha-Lipoic Acid 300 MG CAPS      Ascorbic Acid (VITAMIN C) 100 MG tablet Take 100 mg by mouth daily.     azelastine (ASTELIN) 0.1 % nasal spray Place 1 spray into both nostrils 2 (two) times daily.  budesonide (EQ BUDESONIDE NASAL) 32 MCG/ACT nasal spray Place 2 sprays into both nostrils daily. 1 g 0   Calcium Carbonate-Vit D-Min (CALTRATE 600+D PLUS MINERALS PO) Take 1 tablet by mouth daily.     cevimeline (EVOXAC) 30 MG capsule Take 1 capsule (30 mg total) by mouth 3 (three) times daily. 270 capsule 1   cycloSPORINE (RESTASIS) 0.05 % ophthalmic emulsion Place 1 drop into both eyes 2 (two) times daily. 90 each 0   diclofenac sodium (VOLTAREN) 1 % GEL APPLY EXTERNALLY TO THE AFFECTED AREA FOUR TIMES  DAILY 300 g 2   DULoxetine (CYMBALTA) 30 MG capsule TAKE ONE CAPSULE BY MOUTH EVERY DAY 90 capsule 3   fexofenadine (ALLEGRA) 180 MG tablet Take 180 mg by mouth daily.     gabapentin (NEURONTIN) 100 MG capsule TAKE 2 CAPSULES BY MOUTH EVERY MORNING AND 2 CAPSULES BY MOUTH AT LUNCH 360 capsule 3   gabapentin (NEURONTIN) 600 MG tablet TAKE 1 TABLET(600 MG) BY MOUTH AT BEDTIME 90 tablet 3   hydroxychloroquine (PLAQUENIL) 200 MG tablet Take 1 tablet (200 mg total) by mouth daily. 90 tablet 1   IMVEXXY MAINTENANCE PACK 4 MCG INST SMARTSIG:1 Vaginal Twice a Week     levothyroxine (SYNTHROID) 75 MCG tablet Take 75 mcg by mouth daily before breakfast.     Melatonin 5 MG CAPS Take 1 tablet by mouth at bedtime.     meloxicam (MOBIC) 15 MG tablet Take 1 tablet (15 mg total) by mouth daily as needed for pain. 90 tablet 3   montelukast (SINGULAIR) 10 MG tablet Take 10 mg by mouth at bedtime.     Omega-3 Fatty Acids (FISH OIL) 1000 MG CAPS Take by mouth.     omeprazole (PRILOSEC) 20 MG capsule Take 20 mg by mouth daily.     polyethylene glycol (MIRALAX) 0.34 gm/ml SOLN      Propylene Glycol (SYSTANE BALANCE OP) Apply to eye.     SODIUM FLUORIDE 5000 PPM 1.1 % PSTE See admin instructions.     Varenicline Tartrate (TYRVAYA) 0.03 MG/ACT SOLN Place into the nose.     zoledronic acid (RECLAST) 5 MG/100ML SOLN injection Inject 5 mg into the vein once. Yearly infusion     zolpidem (AMBIEN) 5 MG tablet Take 2.5-5 mg by mouth at bedtime as needed.     No facility-administered medications prior to visit.   Allergies  Allergen Reactions   Clindamycin/Lincomycin Nausea Only     Objective:   Today's Vitals   08/08/21 1304  BP: 124/78  Pulse: 60  Temp: (!) 97.5 F (36.4 C)  TempSrc: Temporal  SpO2: 99%  Weight: 114 lb 6.4 oz (51.9 kg)  Height: 5\' 3"  (1.6 m)   Body mass index is 20.27 kg/m.   General: Well developed, well nourished. No acute distress. Extremities: The right 2nd DIP joint has an  overlying nodular change, possibly a mucous cyst. Psych: Alert and oriented. Normal mood and affect.  There are no preventive care reminders to display for this patient.    Lab Results: Lab Results  Component Value Date   TSH 1.28 05/04/2021   Assessment & Plan:   1. Essential hypertension Ms. Ey's BPs are consistent with at least Stage 1 hypertension. I recommend we start ehr on a diuretic and see how these do. She will continue to monitor her BP at home. I will see her back in 3 months.  - hydrochlorothiazide (HYDRODIURIL) 25 MG tablet; Take 1 tablet (25 mg total) by mouth daily.  Dispense: 90 tablet; Refill: 3  2. Acquired hypothyroidism TSH has been at goal. Continue levothyroxine 75 mcg daily.  3. Primary osteoarthritis of both hands She will be seeing Dr. Amanda Pea soon for the evaluation of her nodular changes to her DIPs and PIP joints. I suspect she has a mucoid cyst as well, which might be able to be drained/removed.  4. Sjogren's syndrome without extraglandular involvement (HCC) Stable.  Return in about 3 months (around 11/08/2021) for Reassessment.   Loyola Mast, MD

## 2021-08-09 ENCOUNTER — Encounter: Payer: Self-pay | Admitting: Family Medicine

## 2021-08-17 ENCOUNTER — Ambulatory Visit: Payer: Medicare Other

## 2021-08-25 ENCOUNTER — Other Ambulatory Visit: Payer: Self-pay | Admitting: Family Medicine

## 2021-08-25 DIAGNOSIS — F5101 Primary insomnia: Secondary | ICD-10-CM

## 2021-08-31 ENCOUNTER — Ambulatory Visit (INDEPENDENT_AMBULATORY_CARE_PROVIDER_SITE_OTHER): Payer: Medicare Other

## 2021-08-31 DIAGNOSIS — Z0000 Encounter for general adult medical examination without abnormal findings: Secondary | ICD-10-CM

## 2021-08-31 NOTE — Patient Instructions (Signed)
Kathleen [name] , Thank you for taking time to come for your Medicare Wellness Visit. I appreciate your ongoing commitment to your health goals. Please review the following plan we discussed and let me know if I can assist you in the future.   Screening recommendations/referrals: Colonoscopy: 09/26/2018 Mammogram: 09/20/2020 Bone Density: 07/25//2022 Recommended yearly ophthalmology/optometry visit for glaucoma screening and checkup Recommended yearly dental visit for hygiene and checkup  Vaccinations: Influenza vaccine: completed  Pneumococcal vaccine: completed  Tdap vaccine: 10/07/2018 Shingles vaccine: completed     Advanced directives: yes   Conditions/risks identified: none   Next appointment: none    Preventive Care 65 Years and Older, Female Preventive care refers to lifestyle choices and visits with your health care provider that can promote health and wellness. What does preventive care include? A yearly physical exam. This is also called an annual well check. Dental exams once or twice a year. Routine eye exams. Ask your health care provider how often you should have your eyes checked. Personal lifestyle choices, including: Daily care of your teeth and gums. Regular physical activity. Eating a healthy diet. Avoiding tobacco and drug use. Limiting alcohol use. Practicing safe sex. Taking low-dose aspirin every day. Taking vitamin and mineral supplements as recommended by your health care provider. What happens during an annual well check? The services and screenings done by your health care provider during your annual well check will depend on your age, overall health, lifestyle risk factors, and family history of disease. Counseling  Your health care provider may ask you questions about your: Alcohol use. Tobacco use. Drug use. Emotional well-being. Home and relationship well-being. Sexual activity. Eating habits. History of falls. Memory and ability to  understand (cognition). Work and work Astronomer. Reproductive health. Screening  You may have the following tests or measurements: Height, weight, and BMI. Blood pressure. Lipid and cholesterol levels. These may be checked every 5 years, or more frequently if you are over 7 years old. Skin check. Lung cancer screening. You may have this screening every year starting at age 25 if you have a 30-pack-year history of smoking and currently smoke or have quit within the past 15 years. Fecal occult blood test (FOBT) of the stool. You may have this test every year starting at age 8. Flexible sigmoidoscopy or colonoscopy. You may have a sigmoidoscopy every 5 years or a colonoscopy every 10 years starting at age 36. Hepatitis C blood test. Hepatitis B blood test. Sexually transmitted disease (STD) testing. Diabetes screening. This is done by checking your blood sugar (glucose) after you have not eaten for a while (fasting). You may have this done every 1-3 years. Bone density scan. This is done to screen for osteoporosis. You may have this done starting at age 12. Mammogram. This may be done every 1-2 years. Talk to your health care provider about how often you should have regular mammograms. Talk with your health care provider about your test results, treatment options, and if necessary, the need for more tests. Vaccines  Your health care provider may recommend certain vaccines, such as: Influenza vaccine. This is recommended every year. Tetanus, diphtheria, and acellular pertussis (Tdap, Td) vaccine. You may need a Td booster every 10 years. Zoster vaccine. You may need this after age 44. Pneumococcal 13-valent conjugate (PCV13) vaccine. One dose is recommended after age 9. Pneumococcal polysaccharide (PPSV23) vaccine. One dose is recommended after age 35. Talk to your health care provider about which screenings and vaccines you need and how often you  need them. This information is not  intended to replace advice given to you by your health care provider. Make sure you discuss any questions you have with your health care provider. Document Released: 03/12/2015 Document Revised: 11/03/2015 Document Reviewed: 12/15/2014 Elsevier Interactive Patient Education  2017 [place] Prevention in the Home Falls can cause injuries. They can happen to people of all ages. There are many things you can do to make your home safe and to help prevent falls. What can I do on the outside of my home? Regularly fix the edges of walkways and driveways and fix any cracks. Remove anything that might make you trip as you walk through a door, such as a raised step or threshold. Trim any bushes or trees on the path to your home. Use bright outdoor lighting. Clear any walking paths of anything that might make someone trip, such as rocks or tools. Regularly check to see if handrails are loose or broken. Make sure that both sides of any steps have handrails. Any raised decks and porches should have guardrails on the edges. Have any leaves, snow, or ice cleared regularly. Use sand or salt on walking paths during winter. Clean up any spills in your garage right away. This includes oil or grease spills. What can I do in the bathroom? Use night lights. Install grab bars by the toilet and in the tub and shower. Do not use towel bars as grab bars. Use non-skid mats or decals in the tub or shower. If you need to sit down in the shower, use a plastic, non-slip stool. Keep the floor dry. Clean up any water that spills on the floor as soon as it happens. Remove soap buildup in the tub or shower regularly. Attach bath mats securely with double-sided non-slip rug tape. Do not have throw rugs and other things on the floor that can make you trip. What can I do in the bedroom? Use night lights. Make sure that you have a light by your bed that is easy to reach. Do not use any sheets or blankets that are  too big for your bed. They should not hang down onto the floor. Have a firm chair that has side arms. You can use this for support while you get dressed. Do not have throw rugs and other things on the floor that can make you trip. What can I do in the kitchen? Clean up any spills right away. Avoid walking on wet floors. Keep items that you use a lot in easy-to-reach places. If you need to reach something above you, use a strong step stool that has a grab bar. Keep electrical cords out of the way. Do not use floor polish or wax that makes floors slippery. If you must use wax, use non-skid floor wax. Do not have throw rugs and other things on the floor that can make you trip. What can I do with my stairs? Do not leave any items on the stairs. Make sure that there are handrails on both sides of the stairs and use them. Fix handrails that are broken or loose. Make sure that handrails are as long as the stairways. Check any carpeting to make sure that it is firmly attached to the stairs. Fix any carpet that is loose or worn. Avoid having throw rugs at the top or bottom of the stairs. If you do have throw rugs, attach them to the floor with carpet tape. Make sure that you have a light switch  at the top of the stairs and the bottom of the stairs. If you do not have them, ask someone to add them for you. What else can I do to help prevent falls? Wear shoes that: Do not have high heels. Have rubber bottoms. Are comfortable and fit you well. Are closed at the toe. Do not wear sandals. If you use a stepladder: Make sure that it is fully opened. Do not climb a closed stepladder. Make sure that both sides of the stepladder are locked into place. Ask someone to hold it for you, if possible. Clearly mark and make sure that you can see: Any grab bars or handrails. First and last steps. Where the edge of each step is. Use tools that help you move around (mobility aids) if they are needed. These  include: Canes. Walkers. Scooters. Crutches. Turn on the lights when you go into a dark area. Replace any light bulbs as soon as they burn out. Set up your furniture so you have a clear path. Avoid moving your furniture around. If any of your floors are uneven, fix them. If there are any pets around you, be aware of where they are. Review your medicines with your doctor. Some medicines can make you feel dizzy. This can increase your chance of falling. Ask your doctor what other things that you can do to help prevent falls. This information is not intended to replace advice given to you by your health care provider. Make sure you discuss any questions you have with your health care provider. Document Released: 12/10/2008 Document Revised: 07/22/2015 Document Reviewed: 03/20/2014 Elsevier Interactive Patient Education  2017 [employer].

## 2021-08-31 NOTE — Progress Notes (Signed)
Subjective:   Kathleen [name] is a 71 y.o. female who presents for an Initial Medicare Annual Wellness Visit.   I connected with Darrel Hoover today by telephone and verified that I am speaking with the correct person using two identifiers. Location patient: home Location provider: work Persons participating in the virtual visit: patient, provider.   I discussed the limitations, risks, security and privacy concerns of performing an evaluation and management service by telephone and the availability of in person appointments. I also discussed with the patient that there may be a patient responsible charge related to this service. The patient expressed understanding and verbally consented to this telephonic visit.    Interactive audio and video telecommunications were attempted between this provider and patient, however failed, due to patient having technical difficulties OR patient did not have access to video capability.  We continued and completed visit with audio only.    Review of Systems     Cardiac Risk Factors include: advanced age (>22men, >23 women)     Objective:    Today's Vitals   There is no height or weight on file to calculate BMI.     08/31/2021    2:04 PM  Advanced Directives  Does Patient Have a Medical Advance Directive? Yes  Type of Estate agent of [place];Living will  Copy of Healthcare Power of Attorney in Chart? No - copy requested    Current Medications (verified) Outpatient Encounter Medications as of 08/31/2021  Medication Sig   acetaminophen (TYLENOL) 325 MG tablet Take 650 mg by mouth every 6 (six) hours as needed.   Alpha-Lipoic Acid 300 MG CAPS    Ascorbic Acid (VITAMIN C) 100 MG tablet Take 100 mg by mouth daily.   azelastine (ASTELIN) 0.1 % nasal spray Place 1 spray into both nostrils 2 (two) times daily.   Calcium Carbonate-Vit D-Min (CALTRATE 600+D PLUS MINERALS PO) Take 1 tablet by mouth daily.   cevimeline (EVOXAC)  30 MG capsule Take 1 capsule (30 mg total) by mouth 3 (three) times daily.   cycloSPORINE (RESTASIS) 0.05 % ophthalmic emulsion Place 1 drop into both eyes 2 (two) times daily.   diclofenac sodium (VOLTAREN) 1 % GEL APPLY EXTERNALLY TO THE AFFECTED AREA FOUR TIMES DAILY   DULoxetine (CYMBALTA) 30 MG capsule TAKE ONE CAPSULE BY MOUTH EVERY DAY   fexofenadine (ALLEGRA) 180 MG tablet Take 180 mg by mouth daily.   gabapentin (NEURONTIN) 100 MG capsule TAKE 2 CAPSULES BY MOUTH EVERY MORNING AND 2 CAPSULES BY MOUTH AT LUNCH   gabapentin (NEURONTIN) 600 MG tablet TAKE 1 TABLET(600 MG) BY MOUTH AT BEDTIME   hydrochlorothiazide (HYDRODIURIL) 25 MG tablet Take 1 tablet (25 mg total) by mouth daily.   hydroxychloroquine (PLAQUENIL) 200 MG tablet Take 1 tablet (200 mg total) by mouth daily.   IMVEXXY MAINTENANCE PACK 4 MCG INST SMARTSIG:1 Vaginal Twice a Week   levothyroxine (SYNTHROID) 75 MCG tablet Take 75 mcg by mouth daily before breakfast.   Melatonin 5 MG CAPS Take 1 tablet by mouth at bedtime.   meloxicam (MOBIC) 15 MG tablet Take 1 tablet (15 mg total) by mouth daily as needed for pain.   montelukast (SINGULAIR) 10 MG tablet Take 10 mg by mouth at bedtime.   Omega-3 Fatty Acids (FISH OIL) 1000 MG CAPS Take by mouth.   omeprazole (PRILOSEC) 20 MG capsule Take 20 mg by mouth daily.   polyethylene glycol (MIRALAX) 0.34 gm/ml SOLN    Propylene Glycol (SYSTANE BALANCE OP) Apply to  eye.   SODIUM FLUORIDE 5000 PPM 1.1 % PSTE See admin instructions.   Varenicline Tartrate (TYRVAYA) 0.03 MG/ACT SOLN Place into the nose.   zoledronic acid (RECLAST) 5 MG/100ML SOLN injection Inject 5 mg into the vein once. Yearly infusion   zolpidem (AMBIEN) 5 MG tablet TAKE 0.5-1 TABLETS (2.5-5 MG TOTAL) BY MOUTH NIGHTLY AS NEEDED FOR SLEEP.   budesonide (EQ BUDESONIDE NASAL) 32 MCG/ACT nasal spray Place 2 sprays into both nostrils daily.   No facility-administered encounter medications on file as of 08/31/2021.     Allergies (verified) Clindamycin/lincomycin   History: Past Medical History:  Diagnosis Date   Acid reflux    Hypothyroid    Osteoarthritis    Sjogren's syndrome (HCC)    Past Surgical History:  Procedure Laterality Date   CATARACT EXTRACTION, BILATERAL     KNEE ARTHROSCOPY W/ MENISCECTOMY Left    Family History  Problem Relation Age of Onset   Stroke Mother    Heart disease Mother    Osteoarthritis Mother    Heart disease Father    Hypertension Father    Heart disease Paternal Uncle    Cancer Paternal Uncle        Lung   Social History   Socioeconomic History   Marital status: Married    Spouse name: Not on file   Number of children: 3   Years of education: Not on file   Highest education level: Not on file  Occupational History   Occupation: Retired  Tobacco Use   Smoking status: Never   Smokeless tobacco: Never  Vaping Use   Vaping Use: Never used  Substance and Sexual Activity   Alcohol use: Yes    Alcohol/week: 1.0 standard drink of alcohol    Types: 1 Cans of beer per week    Comment: socially   Drug use: Never   Sexual activity: Not on file  Other Topics Concern   Not on file  Social History Narrative   Not on file   Social Determinants of Health   Financial Resource Strain: Low Risk  (08/31/2021)   Overall Financial Resource Strain (CARDIA)    Difficulty of Paying Living Expenses: Not hard at all  Food Insecurity: No Food Insecurity (08/31/2021)   Hunger Vital Sign    Worried About Running Out of Food in the Last Year: Never true    Ran Out of Food in the Last Year: Never true  Transportation Needs: No Transportation Needs (08/31/2021)   PRAPARE - Administrator, Civil Service (Medical): No    Lack of Transportation (Non-Medical): No  Physical Activity: Sufficiently Active (08/31/2021)   Exercise Vital Sign    Days of Exercise per Week: 5 days    Minutes of Exercise per Session: 40 min  Stress: No Stress Concern Present  (08/31/2021)   [employer] of Occupational Health - Occupational Stress Questionnaire    Feeling of Stress : Not at all  Social Connections: Socially Integrated (08/31/2021)   Social Connection and Isolation Panel [NHANES]    Frequency of Communication with Friends and Family: Three times a week    Frequency of Social Gatherings with Friends and Family: Three times a week    Attends Religious Services: More than 4 times per year    Active Member of Clubs or Organizations: Yes    Attends Banker Meetings: More than 4 times per year    Marital Status: Married    Tobacco Counseling Counseling given: Not Answered  Clinical Intake:  Pre-visit preparation completed: Yes  Pain : No/denies pain     Nutritional Risks: None Diabetes: No  How often do you need to have someone help you when you read instructions, pamphlets, or other written materials from your doctor or pharmacy?: 1 - Never What is the last grade level you completed in school?: college  Diabetic?noi   Interpreter Needed?: No  Information entered by :: L.Ysabela Keisler,LPN   Activities of Daily Living    08/31/2021    2:05 PM 08/31/2021    9:40 AM  In your present state of health, do you have any difficulty performing the following activities:  Hearing? 0 0  Vision? 0 0  Difficulty concentrating or making decisions? 0 1  Walking or climbing stairs? 0 0  Dressing or bathing? 0 0  Doing errands, shopping? 0 0  Preparing Food and eating ? N N  Using the Toilet? N N  In the past six months, have you accidently leaked urine? N N  Do you have problems with loss of bowel control? N N  Managing your Medications? N N  Managing your Finances? N N  Housekeeping or managing your Housekeeping? N N    Patient Care Team: Loyola Mast, MD as PCP - General (Family Medicine) Fuller Plan, MD as Consulting Physician (Rheumatology) Zelphia Cairo, MD as Consulting Physician (Obstetrics and  Gynecology) Wynn Banker Victorino Sparrow, MD as Consulting Physician (Physical Medicine and Rehabilitation)  Indicate any recent Medical Services you may have received from other than [facility] providers in the past year (date may be approximate).     Assessment:   This is a routine wellness examination for Kathleen [name].  Hearing/Vision screen Vision Screening - Comments:: Annual eye exams wear glasses   Dietary issues and exercise activities discussed: Current Exercise Habits: Home exercise routine, Type of exercise: walking, Time (Minutes): 40, Frequency (Times/Week): 5, Weekly Exercise (Minutes/Week): 200, Intensity: Mild, Exercise limited by: None identified   Goals Addressed   None    Depression Screen    08/31/2021    2:05 PM 08/31/2021    2:03 PM 06/15/2021    1:29 PM 05/04/2021    2:24 PM 11/25/2020   10:17 AM 09/09/2020   10:41 AM 12/04/2019    2:37 PM  PHQ 2/9 Scores  PHQ - 2 Score 0 0 0 0 0 0 0    Fall Risk    08/31/2021    2:05 PM 08/31/2021    9:40 AM 08/29/2021   11:03 AM 06/15/2021    1:28 PM 05/04/2021    2:24 PM  Fall Risk   Falls in the past year? 0 1 1 0 0  Number falls in past yr: 0 0 0 0 0  Injury with Fall? 0 0 0 0 0  Risk for fall due to :     No Fall Risks  Follow up Falls evaluation completed;Education provided    Falls evaluation completed    FALL RISK PREVENTION PERTAINING TO THE HOME:  Any stairs in or around the home? Yes  If so, are there any without handrails? No  Home free of loose throw rugs in walkways, pet beds, electrical cords, etc? Yes  Adequate lighting in your home to reduce risk of falls? Yes   ASSISTIVE DEVICES UTILIZED TO PREVENT FALLS:  Life alert? No  Use of a cane, walker or w/c? No  Grab bars in the bathroom? Yes  Shower chair or bench in shower? No  Elevated toilet seat or  a handicapped toilet? No     Cognitive Function:  Normal cognitive status assessed by direct observation by this Nurse Health Advisor. No abnormalities found.         Immunizations Immunization History  Administered Date(s) Administered   Fluad Quad(high Dose 65+) 12/04/2018   Influenza Split 01/05/2015, 12/12/2015, 11/27/2020   Influenza, High Dose Seasonal PF 12/24/2017   PFIZER Comirnaty(Gray Top)Covid-19 Tri-Sucrose Vaccine 03/24/2019, 04/14/2019, 12/15/2019, 06/14/2020   Pfizer Covid-19 Vaccine Bivalent Booster 54yrs & up 12/09/2020   Pneumococcal Conjugate-13 11/29/2018   Pneumococcal Polysaccharide-23 08/12/2020   Tdap 03/26/2008, 10/07/2018   Zoster Recombinat (Shingrix) 11/26/2017, 02/05/2018   Zoster, Live 11/23/2011, 12/14/2011    TDAP status: Up to date  Flu Vaccine status: Up to date  Pneumococcal vaccine status: Up to date  Covid-19 vaccine status: Completed vaccines  Qualifies for Shingles Vaccine? Yes   Zostavax completed Yes   Shingrix Completed?: Yes  Screening Tests Health Maintenance  Topic Date Due   MAMMOGRAM  09/20/2021   INFLUENZA VACCINE  09/27/2021   COLONOSCOPY (Pts 45-72yrs Insurance coverage will need to be confirmed)  09/25/2025   TETANUS/TDAP  10/06/2028   Pneumonia Vaccine 64+ Years old  Completed   DEXA SCAN  Completed   COVID-19 Vaccine  Completed   Hepatitis C Screening  Completed   Zoster Vaccines- Shingrix  Completed   HPV VACCINES  Aged Out    Health Maintenance  There are no preventive care reminders to display for this patient.  Colorectal cancer screening: Type of screening: Colonoscopy. Completed 09/26/2018. Repeat every 7 years  Mammogram status: Completed 09/20/2020. Repeat every year  Bone Density status: Completed 09/20/2020. Results reflect: Bone density results: OSTEOPENIA. Repeat every 5 years.  Lung Cancer Screening: (Low Dose CT Chest recommended if Age 65-80 years, 30 pack-year currently smoking OR have quit w/in 15years.) does not qualify.   Lung Cancer Screening Referral: n/a  Additional Screening:  Hepatitis C Screening: does not qualify; Completed  05/04/2021  Vision Screening: Recommended annual ophthalmology exams for early detection of glaucoma and other disorders of the eye. Is the patient up to date with their annual eye exam?  Yes  Who is the provider or what is the name of the office in which the patient attends annual eye exams? [facility]  If pt is not established with a provider, would they like to be referred to a provider to establish care? No .   Dental Screening: Recommended annual dental exams for proper oral hygiene  Community Resource Referral / Chronic Care Management: CRR required this visit?  No   CCM required this visit?  No      Plan:     I have personally reviewed and noted the following in the patient's chart:   Medical and social history Use of alcohol, tobacco or illicit drugs  Current medications and supplements including opioid prescriptions. Patient is not currently taking opioid prescriptions. Functional ability and status Nutritional status Physical activity Advanced directives List of other physicians Hospitalizations, surgeries, and ER visits in previous 12 months Vitals Screenings to include cognitive, depression, and falls Referrals and appointments  In addition, I have reviewed and discussed with patient certain preventive protocols, quality metrics, and best practice recommendations. A written personalized care plan for preventive services as well as general preventive health recommendations were provided to patient.     March Rummage, LPN   05/05/7562   Nurse Notes: none

## 2021-09-21 LAB — HM MAMMOGRAPHY

## 2021-09-26 ENCOUNTER — Other Ambulatory Visit: Payer: Self-pay | Admitting: Family Medicine

## 2021-09-26 DIAGNOSIS — M47816 Spondylosis without myelopathy or radiculopathy, lumbar region: Secondary | ICD-10-CM

## 2021-09-26 NOTE — Progress Notes (Unsigned)
Office Visit Note  Patient: Kathleen [name]             Date of Birth: September 13, 1950           MRN: [id number]             PCP: Loyola Mast, MD Referring: Loyal Jacobson, MD Visit Date: 10/05/2021   Subjective:  Follow-up (Feeling pretty good. Sjogren's symptoms showing more. )   History of Present Illness: Kathleen [name] is a 71 y.o. female here for follow up for sjogren's syndrome.   Previous HPI 04/07/2021 [name] is a 71 y.o. female here for sjogren's syndrome on hydroxycloroquine 200 mg daily and cevimeline TID for dry mouth. She previously saw Dr. Ancil Linsey for this problem.  She was originally diagnosed after establishing care with him in 2016 with existing problems of chronic arthritis affecting facet joints knees and hands.  Work-up revealed a positive ANA with significant eye and mouth dryness symptoms no specific extractable nuclear antibodies identified.  She was not sure since a specific time point her symptoms developed somewhat progressive over preceding years.  She was started on hydroxychloroquine with partial symptom improvement as well as continuing topical diclofenac, oral meloxicam, gabapentin, and Cymbalta for chronic degenerative joint pain especially from the back.  Symptoms in her fingers and knees are persistent but not severely limiting of activity.  She has noticed some progression of the finger deformities over the past several years but usually without acute swelling redness or warmth. For her dry mouth symptoms she uses a Biotene spray drinks frequently and xylitol oral lozenges.  She takes cevimeline 3 times daily with a good benefit and no significant intolerance.  She denies any pain or swelling around major salivary glands or cervical lymphadenopathy. For dry eyes she uses Systane drops also using Restasis drops in both eyes twice daily with a good benefit.  She never experiences any periorbital swelling has no previous major abrasions or other  complications.  She has regular follow-up with her eye Dr. Billie Ruddy at [facility] most recent in August 2022 with normal OCT testing and annual follow-up.  She most recently was started on a trial of Tyrvaya nasal spray she is not sure if there was a large change in symptoms but also did not continue the medicine for a long time due to lack of follow-up and finishing the sample amount.     Labs reviewed 06/2017 SSA neg SSB neg   12/2014 ANA 1:160 homogenous   Review of Systems  Constitutional:  Negative for fatigue.  HENT:  Positive for mouth dryness. Negative for mouth sores.   Eyes:  Positive for dryness.  Respiratory:  Negative for shortness of breath.   Cardiovascular:  Negative for chest pain and palpitations.  Gastrointestinal:  Negative for blood in stool, constipation and diarrhea.  Endocrine: Negative for increased urination.  Genitourinary:  Negative for involuntary urination.  Musculoskeletal:  Positive for joint pain, joint pain and morning stiffness. Negative for joint swelling, myalgias, muscle weakness, muscle tenderness and myalgias.  Skin:  Negative for color change, rash, hair loss and sensitivity to sunlight.  Allergic/Immunologic: Negative for susceptible to infections.  Neurological:  Positive for headaches. Negative for dizziness.  Hematological:  Negative for swollen glands.  Psychiatric/Behavioral:  Positive for sleep disturbance. Negative for depressed mood. The patient is not nervous/anxious.     PMFS History:  Patient Active Problem List   Diagnosis Date Noted   Sjogren's syndrome (HCC) 04/07/2021   Osteoarthritis of  hands, bilateral 04/07/2021   Essential hypertension 02/27/2021   Chronic right sacroiliac pain 09/09/2020   Osteoporosis 11/29/2018   Piriformis syndrome of right side 10/23/2017   Spondylosis without myelopathy or radiculopathy, 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   Chronic myofascial pain 04/02/2017   ANA positive 01/25/2017   High risk  medication use 05/30/2016   Xerostomia 05/03/2015   GERD (gastroesophageal reflux disease) 04/01/2015   IBS (irritable bowel syndrome) 04/01/2015   Insomnia 04/01/2015   Chronic seasonal allergic rhinitis due to pollen 02/04/2015   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   Tinnitus 02/04/2015   Multifactorial low back pain 12/15/2014   Personal history of other malignant neoplasm of skin 12/08/2011   Chronic back pain 08/24/2011   Hypothyroidism 02/28/1995    Past Medical History:  Diagnosis Date   Acid reflux    Hypothyroid    Osteoarthritis    Sjogren's syndrome (HCC)     Family History  Problem Relation Age of Onset   Stroke Mother    Heart disease Mother    Osteoarthritis Mother    Heart disease Father    Hypertension Father    Heart disease Paternal Uncle    Cancer Paternal Uncle        Lung   Past Surgical History:  Procedure Laterality Date   CATARACT EXTRACTION, BILATERAL     KNEE ARTHROSCOPY W/ MENISCECTOMY Left    Social History   Social History Narrative   Not on file   Immunization History  Administered Date(s) Administered   Fluad Quad(high Dose 65+) 12/04/2018   Influenza Split 01/05/2015, 12/12/2015, 11/27/2020   Influenza, High Dose Seasonal PF 12/24/2017   PFIZER Comirnaty(Gray Top)Covid-19 Tri-Sucrose Vaccine 03/24/2019, 04/14/2019, 12/15/2019, 06/14/2020   Pfizer Covid-19 Vaccine Bivalent Booster 48yrs & up 12/09/2020   Pneumococcal Conjugate-13 11/29/2018   Pneumococcal Polysaccharide-23 08/12/2020   Tdap 03/26/2008, 10/07/2018   Zoster Recombinat (Shingrix) 11/26/2017, 02/05/2018   Zoster, Live 11/23/2011, 12/14/2011     Objective: Vital Signs: BP 111/70 (BP Location: Left Arm, Patient Position: Sitting, Cuff Size: Normal)   Pulse 61   Resp 14   Ht 5\' 3"  (1.6 m)   Wt 113 lb 9.6 oz (51.5 kg)   BMI 20.12 kg/m    Physical Exam   Musculoskeletal Exam: ***  CDAI Exam: CDAI Score: -- Patient Global: --; Provider Global:  -- Swollen: --; Tender: -- Joint Exam 10/05/2021   No joint exam has been documented for this visit   There is currently no information documented on the homunculus. Go to the Rheumatology activity and complete the homunculus joint exam.  Investigation: No additional findings.  Imaging: No results found.  Recent Labs: Lab Results  Component Value Date   NA 136 05/05/2016   K 4.2 05/05/2016   CL 102 05/05/2016   CO2 27 05/05/2016   GLUCOSE 89 05/05/2016   BUN 18 05/05/2016   CREATININE 0.73 05/05/2016   BILITOT 0.3 05/05/2016   ALKPHOS 51 05/05/2016   AST 22 05/05/2016   ALT 17 05/05/2016   PROT 6.2 05/05/2016   ALBUMIN 4.5 05/05/2016   CALCIUM 9.2 05/05/2016    Speciality Comments: No specialty comments available.  Procedures:  No procedures performed Allergies: Clindamycin/lincomycin   Assessment / Plan:     Visit Diagnoses: Sjogren's syndrome without extraglandular involvement (HCC)  Age-related osteoporosis without current pathological fracture - On Reclast infusions since 2020 taking vitamin D and calcium supplementation no known fractures or prior treatments.  High risk medication use - hydroxychloroquine 200  mg daily   Primary osteoarthritis of both hands  Chronic right sacroiliac pain  Chronic myofascial pain  ***  Orders: No orders of the defined types were placed in this encounter.  No orders of the defined types were placed in this encounter.    Follow-Up Instructions: No follow-ups on file.   Fuller Plan, MD  Note - This record has been created using [employer].  Chart creation errors have been sought, but may not always  have been located. Such creation errors do not reflect on  the standard of medical care.

## 2021-09-26 NOTE — Telephone Encounter (Signed)
Refill request for Duloxetine 30 mg LR 11/24/19, #90, 3 rf  (Dr Marcy Siren) LOV 08/08/21 FOV 11/01/21  Pease review and advise.  Thanks. Dm/cma

## 2021-10-04 ENCOUNTER — Telehealth: Payer: Self-pay | Admitting: Family Medicine

## 2021-10-04 MED ORDER — MONTELUKAST SODIUM 10 MG PO TABS: 10.0000 mg | ORAL_TABLET | Freq: Every day | ORAL | 1 refills | Status: DC

## 2021-10-04 NOTE — Telephone Encounter (Signed)
Patient notified VIA phone. Dm/cma  

## 2021-10-04 NOTE — Telephone Encounter (Signed)
Caller Name: Winter Call back phone #: [phone]  MEDICATION(S): montelukast (SINGULAIR) 10 MG tablet   Days of Med Remaining: out - going out of town Thursday  Preferred Pharmacy: [id number] - [place], [place] - [address]

## 2021-10-05 ENCOUNTER — Ambulatory Visit: Payer: Medicare Other | Attending: Internal Medicine | Admitting: Internal Medicine

## 2021-10-05 ENCOUNTER — Encounter: Payer: Self-pay | Admitting: Internal Medicine

## 2021-10-05 VITALS — BP 111/70 | HR 61 | Resp 14 | Ht 63.0 in | Wt 113.6 lb

## 2021-10-05 DIAGNOSIS — M19041 Primary osteoarthritis, right hand: Secondary | ICD-10-CM

## 2021-10-05 DIAGNOSIS — M533 Sacrococcygeal disorders, not elsewhere classified: Secondary | ICD-10-CM | POA: Diagnosis present

## 2021-10-05 DIAGNOSIS — M19042 Primary osteoarthritis, left hand: Secondary | ICD-10-CM

## 2021-10-05 DIAGNOSIS — M3500 Sicca syndrome, unspecified: Secondary | ICD-10-CM

## 2021-10-05 DIAGNOSIS — G8929 Other chronic pain: Secondary | ICD-10-CM

## 2021-10-05 DIAGNOSIS — M810 Age-related osteoporosis without current pathological fracture: Secondary | ICD-10-CM

## 2021-10-05 DIAGNOSIS — Z79899 Other long term (current) drug therapy: Secondary | ICD-10-CM

## 2021-10-05 DIAGNOSIS — M7918 Myalgia, other site: Secondary | ICD-10-CM | POA: Diagnosis present

## 2021-10-05 MED ORDER — CYCLOSPORINE 0.05 % OP EMUL
1.0000 [drp] | Freq: Two times a day (BID) | OPHTHALMIC | 0 refills | Status: DC
Start: 2021-10-05 — End: 2022-11-01

## 2021-10-05 MED ORDER — HYDROXYCHLOROQUINE SULFATE 200 MG PO TABS: 200.0000 mg | ORAL_TABLET | Freq: Every day | ORAL | 1 refills | Status: DC

## 2021-10-05 MED ORDER — TYRVAYA 0.03 MG/ACT NA SOLN: 1.0000 | Freq: Two times a day (BID) | NASAL | 1 refills | Status: DC

## 2021-10-05 MED ORDER — CEVIMELINE HCL 30 MG PO CAPS: 30.0000 mg | ORAL_CAPSULE | Freq: Three times a day (TID) | ORAL | 1 refills | Status: DC

## 2021-10-05 NOTE — Progress Notes (Incomplete)
Office Visit Note  Patient: Kathleen [name]             Date of Birth: Jul 27, 1950           MRN: [id number]             PCP: Loyola Mast, MD Referring: Loyal Jacobson, MD Visit Date: 10/05/2021   Subjective:  Follow-up (Feeling pretty good. Sjogren's symptoms showing more. )   History of Present Illness: Kathleen [name] is a 71 y.o. female here for follow up for sjogren's syndrome on HCQ 200 mg daily. She is taking cevimeline 30 mg TID for dry mouth and using restasis and lubricating eye drops for dry eyes. She started using the Tyrvaya in the past 2 weeks so far not sure about the amount of difference.  Previous HPI 04/07/2021 Kathleen [name] is a 71 y.o. female here for sjogren's syndrome on hydroxycloroquine 200 mg daily and cevimeline TID for dry mouth. She previously saw Dr. Ancil Linsey for this problem.  She was originally diagnosed after establishing care with him in 2016 with existing problems of chronic arthritis affecting facet joints knees and hands.  Work-up revealed a positive ANA with significant eye and mouth dryness symptoms no specific extractable nuclear antibodies identified.  She was not sure since a specific time point her symptoms developed somewhat progressive over preceding years.  She was started on hydroxychloroquine with partial symptom improvement as well as continuing topical diclofenac, oral meloxicam, gabapentin, and Cymbalta for chronic degenerative joint pain especially from the back.  Symptoms in her fingers and knees are persistent but not severely limiting of activity.  She has noticed some progression of the finger deformities over the past several years but usually without acute swelling redness or warmth. For her dry mouth symptoms she uses a Biotene spray drinks frequently and xylitol oral lozenges.  She takes cevimeline 3 times daily with a good benefit and no significant intolerance.  She denies any pain or swelling around major salivary glands or cervical  lymphadenopathy. For dry eyes she uses Systane drops also using Restasis drops in both eyes twice daily with a good benefit.  She never experiences any periorbital swelling has no previous major abrasions or other complications.  She has regular follow-up with her eye Dr. Billie Ruddy at [facility] most recent in August 2022 with normal OCT testing and annual follow-up.  She most recently was started on a trial of Tyrvaya nasal spray she is not sure if there was a large change in symptoms but also did not continue the medicine for a long time due to lack of follow-up and finishing the sample amount.     Labs reviewed 06/2017 SSA neg SSB neg   12/2014 ANA 1:160 homogenous   Review of Systems  Constitutional:  Negative for fatigue.  HENT:  Positive for mouth dryness. Negative for mouth sores.   Eyes:  Positive for dryness.  Respiratory:  Negative for shortness of breath.   Cardiovascular:  Negative for chest pain and palpitations.  Gastrointestinal:  Negative for blood in stool, constipation and diarrhea.  Endocrine: Negative for increased urination.  Genitourinary:  Negative for involuntary urination.  Musculoskeletal:  Positive for joint pain, joint pain and morning stiffness. Negative for joint swelling, myalgias, muscle weakness, muscle tenderness and myalgias.  Skin:  Negative for color change, rash, hair loss and sensitivity to sunlight.  Allergic/Immunologic: Negative for susceptible to infections.  Neurological:  Positive for headaches. Negative for dizziness.  Hematological:  Negative for swollen  glands.  Psychiatric/Behavioral:  Positive for sleep disturbance. Negative for depressed mood. The patient is not nervous/anxious.     PMFS History:  Patient Active Problem List   Diagnosis Date Noted  . Sjogren's syndrome (HCC) 04/07/2021  . Osteoarthritis of hands, bilateral 04/07/2021  . Essential hypertension 02/27/2021  . Chronic right sacroiliac pain 09/09/2020  .  Osteoporosis 11/29/2018  . Piriformis syndrome of right side 10/23/2017  . Spondylosis without myelopathy or radiculopathy, 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  . Chronic myofascial pain 04/02/2017  . ANA positive 01/25/2017  . High risk medication use 05/30/2016  . Xerostomia 05/03/2015  . GERD (gastroesophageal reflux disease) 04/01/2015  . IBS (irritable bowel syndrome) 04/01/2015  . Insomnia 04/01/2015  . Chronic seasonal allergic rhinitis due to pollen 02/04/2015  . 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  . Tinnitus 02/04/2015  . Multifactorial low back pain 12/15/2014  . Personal history of other malignant neoplasm of skin 12/08/2011  . Chronic back pain 08/24/2011  . Hypothyroidism 02/28/1995    Past Medical History:  Diagnosis Date  . Acid reflux   . Hypothyroid   . Osteoarthritis   . Sjogren's syndrome (HCC)     Family History  Problem Relation Age of Onset  . Stroke Mother   . Heart disease Mother   . Osteoarthritis Mother   . Heart disease Father   . Hypertension Father   . Heart disease Paternal Uncle   . Cancer Paternal Uncle        Lung   Past Surgical History:  Procedure Laterality Date  . CATARACT EXTRACTION, BILATERAL    . KNEE ARTHROSCOPY W/ MENISCECTOMY Left    Social History   Social History Narrative  . Not on file   Immunization History  Administered Date(s) Administered  . Fluad Quad(high Dose 65+) 12/04/2018  . Influenza Split 01/05/2015, 12/12/2015, 11/27/2020  . Influenza, High Dose Seasonal PF 12/24/2017  . PFIZER Comirnaty(Gray Top)Covid-19 Tri-Sucrose Vaccine 03/24/2019, 04/14/2019, 12/15/2019, 06/14/2020  . [employer], [employer] 6yrs & up 12/09/2020  . Pneumococcal Conjugate-13 11/29/2018  . Pneumococcal Polysaccharide-23 08/12/2020  . Tdap 03/26/2008, 10/07/2018  . Zoster Recombinat (Shingrix) 11/26/2017, 02/05/2018  . Zoster, Live 11/23/2011, 12/14/2011     Objective: Vital Signs: BP 111/70 (BP Location:  Left Arm, Patient Position: Sitting, Cuff Size: Normal)   Pulse 61   Resp 14   Ht 5\' 3"  (1.6 m)   Wt 113 lb 9.6 oz (51.5 kg)   BMI 20.12 kg/m    Physical Exam   Musculoskeletal Exam: ***  CDAI Exam: CDAI Score: -- Patient Global: --; Provider Global: -- Swollen: --; Tender: -- Joint Exam 10/05/2021   No joint exam has been documented for this visit   There is currently no information documented on the homunculus. Go to the Rheumatology activity and complete the homunculus joint exam.  Investigation: No additional findings.  Imaging: No results found.  Recent Labs: Lab Results  Component Value Date   WBC 4.8 10/05/2021   HGB 12.6 10/05/2021   PLT 314 10/05/2021   NA 136 05/05/2016   K 4.2 05/05/2016   CL 102 05/05/2016   CO2 27 05/05/2016   GLUCOSE 89 05/05/2016   BUN 18 05/05/2016   CREATININE 0.73 05/05/2016   BILITOT 0.3 05/05/2016   ALKPHOS 51 05/05/2016   AST 22 05/05/2016   ALT 17 05/05/2016   PROT 6.2 05/05/2016   ALBUMIN 4.5 05/05/2016   CALCIUM 9.2 05/05/2016    Speciality Comments: No specialty comments available.  Procedures:  No procedures performed Allergies: Clindamycin/lincomycin   Assessment / Plan:     Visit Diagnoses: Sjogren's syndrome without extraglandular involvement ([place]) - Plan: Sedimentation rate, hydroxychloroquine (PLAQUENIL) 200 MG tablet, cycloSPORINE (RESTASIS) 0.05 % ophthalmic emulsion, cevimeline (EVOXAC) 30 MG capsule, Varenicline Tartrate (TYRVAYA) 0.03 MG/ACT SOLN  Age-related osteoporosis without current pathological fracture - On Reclast infusions since 2020 taking vitamin D and calcium supplementation no known fractures or prior treatments.  High risk medication use - hydroxychloroquine 200 mg daily  - Plan: CBC with Differential/Platelet, COMPLETE METABOLIC PANEL WITH GFR  Primary osteoarthritis of both hands  Chronic right sacroiliac pain  Chronic myofascial pain  ***  Orders: Orders Placed This Encounter   Procedures  . Sedimentation rate  . CBC with Differential/Platelet  . COMPLETE METABOLIC PANEL WITH GFR   Meds ordered this encounter  Medications  . hydroxychloroquine (PLAQUENIL) 200 MG tablet    Sig: Take 1 tablet (200 mg total) by mouth daily.    Dispense:  90 tablet    Refill:  1  . cycloSPORINE (RESTASIS) 0.05 % ophthalmic emulsion    Sig: Place 1 drop into both eyes 2 (two) times daily.    Dispense:  90 each    Refill:  0  . cevimeline (EVOXAC) 30 MG capsule    Sig: Take 1 capsule (30 mg total) by mouth 3 (three) times daily.    Dispense:  270 capsule    Refill:  1  . Varenicline Tartrate (TYRVAYA) 0.03 MG/ACT SOLN    Sig: Place 1 spray into both nostrils 2 (two) times daily.    Dispense:  13.5 mL    Refill:  1     Follow-Up Instructions: Return in about 6 months (around 04/07/2022) for pSS on HCQ/others f/u 52mos.   Collier Salina, MD  Note - This record has been created using [employer].  Chart creation errors have been sought, but may not always  have been located. Such creation errors do not reflect on  the standard of medical care.

## 2021-10-06 LAB — CBC WITH DIFFERENTIAL/PLATELET
Absolute Monocytes: 418 cells/uL (ref 200–950)
Basophils Absolute: 10 cells/uL (ref 0–200)
Basophils Relative: 0.2 %
Eosinophils Absolute: 29 cells/uL (ref 15–500)
Eosinophils Relative: 0.6 %
HCT: 37.1 % (ref 35.0–45.0)
Hemoglobin: 12.6 g/dL (ref 11.7–15.5)
Lymphs Abs: 984 cells/uL (ref 850–3900)
MCH: 31.2 pg (ref 27.0–33.0)
MCHC: 34 g/dL (ref 32.0–36.0)
MCV: 91.8 fL (ref 80.0–100.0)
MPV: 9.4 fL (ref 7.5–12.5)
Monocytes Relative: 8.7 %
Neutro Abs: 3360 cells/uL (ref 1500–7800)
Neutrophils Relative %: 70 %
Platelets: 314 10*3/uL (ref 140–400)
RBC: 4.04 10*6/uL (ref 3.80–5.10)
RDW: 12.1 % (ref 11.0–15.0)
Total Lymphocyte: 20.5 %
WBC: 4.8 10*3/uL (ref 3.8–10.8)

## 2021-10-06 LAB — COMPLETE METABOLIC PANEL WITH GFR
AG Ratio: 2.1 (calc) (ref 1.0–2.5)
ALT: 16 U/L (ref 6–29)
AST: 20 U/L (ref 10–35)
Albumin: 4.5 g/dL (ref 3.6–5.1)
Alkaline phosphatase (APISO): 38 U/L (ref 37–153)
BUN: 21 mg/dL (ref 7–25)
CO2: 30 mmol/L (ref 20–32)
Calcium: 9.8 mg/dL (ref 8.6–10.4)
Chloride: 91 mmol/L — ABNORMAL LOW (ref 98–110)
Creat: 0.82 mg/dL (ref 0.50–1.05)
Globulin: 2.1 g/dL (calc) (ref 1.9–3.7)
Glucose, Bld: 89 mg/dL (ref 65–99)
Potassium: 4.8 mmol/L (ref 3.5–5.3)
Sodium: 127 mmol/L — ABNORMAL LOW (ref 135–146)
Total Bilirubin: 0.4 mg/dL (ref 0.2–1.2)
Total Protein: 6.6 g/dL (ref 6.1–8.1)
eGFR: 77 mL/min/{1.73_m2} (ref >=60)

## 2021-10-06 LAB — SEDIMENTATION RATE: Sed Rate: 2 mm/h (ref 0–30)

## 2021-10-09 NOTE — Progress Notes (Signed)
Sedimentation rate is normal. Her labs are fine for continuing hydroxychloroquine.  Sodium and chloride are slightly low, has she been suffering any problems decreased food or fluid intake, or diarrhea?

## 2021-10-18 ENCOUNTER — Encounter: Payer: Self-pay | Admitting: Internal Medicine

## 2021-10-18 DIAGNOSIS — H04123 Dry eye syndrome of bilateral lacrimal glands: Secondary | ICD-10-CM | POA: Insufficient documentation

## 2021-10-19 ENCOUNTER — Telehealth: Payer: Self-pay

## 2021-10-19 NOTE — Telephone Encounter (Signed)
Prior authorization was submitted for Tyrvaya. Approved from 10/18/2021-10/18/2021.

## 2021-11-01 ENCOUNTER — Encounter: Payer: Self-pay | Admitting: Family Medicine

## 2021-11-01 ENCOUNTER — Ambulatory Visit (INDEPENDENT_AMBULATORY_CARE_PROVIDER_SITE_OTHER): Payer: Medicare Other | Admitting: Family Medicine

## 2021-11-01 VITALS — BP 114/68 | HR 62 | Temp 97.3°F | Ht 63.0 in | Wt 112.4 lb

## 2021-11-01 DIAGNOSIS — E039 Hypothyroidism, unspecified: Secondary | ICD-10-CM | POA: Diagnosis not present

## 2021-11-01 DIAGNOSIS — E871 Hypo-osmolality and hyponatremia: Secondary | ICD-10-CM | POA: Diagnosis not present

## 2021-11-01 DIAGNOSIS — I10 Essential (primary) hypertension: Secondary | ICD-10-CM

## 2021-11-01 DIAGNOSIS — Z8489 Family history of other specified conditions: Secondary | ICD-10-CM | POA: Insufficient documentation

## 2021-11-01 LAB — BASIC METABOLIC PANEL
BUN: 21 mg/dL (ref 6–23)
CO2: 25 mEq/L (ref 19–32)
Calcium: 9.5 mg/dL (ref 8.4–10.5)
Chloride: 91 mEq/L — ABNORMAL LOW (ref 96–112)
Creatinine, Ser: 0.93 mg/dL (ref 0.40–1.20)
GFR: 62.84 mL/min (ref >60.00)
Glucose, Bld: 88 mg/dL (ref 70–99)
Potassium: 4.2 mEq/L (ref 3.5–5.1)
Sodium: 125 mEq/L — ABNORMAL LOW (ref 135–145)

## 2021-11-01 LAB — TSH: TSH: 1.49 u[IU]/mL (ref 0.35–5.50)

## 2021-11-01 NOTE — Progress Notes (Signed)
[facility] [address] [place] [address] Dept: [phone] Dept Fax: [phone]  Chronic Care Office Visit  Subjective:    Patient ID: Luzma Enea, female    DOB: 06-Oct-1950, 71 y.o..   MRN: [id number]  Chief Complaint  Patient presents with   Follow-up    3 month f/u.      History of Present Illness:  Patient is in today for reassessment of chronic medical issues.  Ms. Vanegas has a history of hypothyroidism. She is managed on levothyroxine 75 mcg daily.   Ms. Mcclaugherty has a history of Sjgren's syndrome. She sees Dr. Benjamine Mola (rheumatology). She is managed on hydroxychloroquine. She struggles with dry eye symptoms, so is managed with Alpha lipoic acid, cevimeline (Evoxac), cyclosporine (Restasis), Systane, and varenicline (Tyrvaya) for these issues. She was off of the varenicline for a while, but had restarted this recently. She noted her blood pressure was running higher on the medicine and that she was getting regular headaches. She stopped this three days ago, and now her headaches are improved.   Ms. Brash was diagnosed with hypertension at her last visit.  I started her on HCTZ 25 mg daily. As mentioned above, she had some recent BPs at home that were running higher. She was averaging [id number] systolic.   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   Essential hypertension 02/27/2021   Chronic right sacroiliac pain 09/09/2020   Osteoporosis 11/29/2018   Piriformis syndrome of right side 10/23/2017   Spondylosis without myelopathy or radiculopathy, 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   Chronic myofascial pain 04/02/2017   ANA positive 01/25/2017   High risk medication use 05/30/2016   Xerostomia 05/03/2015   GERD (gastroesophageal reflux disease)  04/01/2015   IBS (irritable bowel syndrome) 04/01/2015   Insomnia 04/01/2015   Chronic seasonal allergic rhinitis due to pollen 02/04/2015   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   Tinnitus 02/04/2015   Multifactorial low back pain 12/15/2014   Personal history of other malignant neoplasm of skin 12/08/2011   Chronic back pain 08/24/2011   Hypothyroidism 02/28/1995   Past Surgical History:  Procedure Laterality Date   CATARACT EXTRACTION, BILATERAL     KNEE ARTHROSCOPY W/ MENISCECTOMY Left    Family History  Problem Relation Age of Onset   Stroke Mother    Heart disease Mother    Osteoarthritis Mother    Heart disease Father    Hypertension Father    Heart disease Paternal Uncle    Cancer Paternal Uncle        Lung   Outpatient Medications Prior to Visit  Medication Sig Dispense Refill   acetaminophen (TYLENOL) 325 MG tablet Take 650 mg by mouth every 6 (six) hours as needed.     Alpha-Lipoic Acid 300 MG CAPS      Ascorbic Acid (VITAMIN C) 100 MG tablet Take 100 mg by mouth daily.     azelastine (ASTELIN) 0.1 % nasal spray Place 1 spray into both nostrils 2 (two) times daily.     Calcium Carbonate-Vit D-Min (CALTRATE 600+D PLUS MINERALS PO) Take 1 tablet by mouth daily.     cevimeline (EVOXAC) 30 MG capsule Take 1 capsule (30 mg total) by mouth 3 (three) times daily. 270 capsule 1   cycloSPORINE (RESTASIS) 0.05 % ophthalmic emulsion Place 1 drop into both  eyes 2 (two) times daily. 90 each 0   diclofenac sodium (VOLTAREN) 1 % GEL APPLY EXTERNALLY TO THE AFFECTED AREA FOUR TIMES DAILY 300 g 2   DULoxetine (CYMBALTA) 30 MG capsule TAKE 1 CAPSULE BY MOUTH EVERY DAY 90 capsule 3   fexofenadine (ALLEGRA) 180 MG tablet Take 180 mg by mouth daily.     gabapentin (NEURONTIN) 100 MG capsule TAKE 2 CAPSULES BY MOUTH EVERY MORNING AND 2 CAPSULES BY MOUTH AT LUNCH 360 capsule 3   gabapentin (NEURONTIN) 600 MG tablet TAKE 1 TABLET(600 MG) BY MOUTH AT BEDTIME 90 tablet 3    hydrochlorothiazide (HYDRODIURIL) 25 MG tablet Take 1 tablet (25 mg total) by mouth daily. 90 tablet 3   hydroxychloroquine (PLAQUENIL) 200 MG tablet Take 1 tablet (200 mg total) by mouth daily. 90 tablet 1   IMVEXXY MAINTENANCE PACK 4 MCG INST SMARTSIG:1 Vaginal Twice a Week     levothyroxine (SYNTHROID) 75 MCG tablet Take 75 mcg by mouth daily before breakfast.     Melatonin 5 MG CAPS Take 1 tablet by mouth at bedtime.     meloxicam (MOBIC) 15 MG tablet Take 1 tablet (15 mg total) by mouth daily as needed for pain. 90 tablet 3   montelukast (SINGULAIR) 10 MG tablet Take 1 tablet (10 mg total) by mouth at bedtime. 90 tablet 1   Omega-3 Fatty Acids (FISH OIL) 1000 MG CAPS Take by mouth.     omeprazole (PRILOSEC) 20 MG capsule Take 20 mg by mouth daily.     polyethylene glycol (MIRALAX) 0.34 gm/ml SOLN      Propylene Glycol (SYSTANE BALANCE OP) Apply to eye.     SODIUM FLUORIDE 5000 PPM 1.1 % PSTE See admin instructions.     zoledronic acid (RECLAST) 5 MG/100ML SOLN injection Inject 5 mg into the vein once. Yearly infusion     zolpidem (AMBIEN) 5 MG tablet TAKE 0.5-1 TABLETS (2.5-5 MG TOTAL) BY MOUTH NIGHTLY AS NEEDED FOR SLEEP. 30 tablet 1   budesonide (EQ BUDESONIDE NASAL) 32 MCG/ACT nasal spray Place 2 sprays into both nostrils daily. (Patient not taking: Reported on 11/01/2021) 1 g 0   Varenicline Tartrate (TYRVAYA) 0.03 MG/ACT SOLN Place 1 spray into both nostrils 2 (two) times daily. (Patient not taking: Reported on 11/01/2021) 13.5 mL 1   No facility-administered medications prior to visit.   Allergies  Allergen Reactions   Clindamycin/Lincomycin Nausea Only     Objective:   Today's Vitals   11/01/21 1307  BP: 114/68  Pulse: 62  Temp: (!) 97.3 F (36.3 C)  TempSrc: Temporal  SpO2: 98%  Weight: 112 lb 6.4 oz (51 kg)  Height: 5\' 3"  (1.6 m)   Body mass index is 19.91 kg/m.   General: Well developed, well nourished. No acute distress. Psych: Alert and oriented. Normal mood and  affect.  Health Maintenance Due  Topic Date Due   MAMMOGRAM  09/20/2021   INFLUENZA VACCINE  09/27/2021   Lab Results:    Latest Ref Rng & Units 10/05/2021   11:26 AM 05/05/2016    1:52 PM  CMP  Glucose 65 - 99 mg/dL 89  89   BUN 7 - 25 mg/dL 21  18   Creatinine 07/05/2016 - 1.05 mg/dL 5.18  8.41   Sodium 6.60 - 146 mmol/L 127  136   Potassium 3.5 - 5.3 mmol/L 4.8  4.2   Chloride 98 - 110 mmol/L 91  102   CO2 20 - 32 mmol/L 30  27  Calcium 8.6 - 10.4 mg/dL 9.8  9.2   Total Protein 6.1 - 8.1 g/dL 6.6  6.2   Total Bilirubin 0.2 - 1.2 mg/dL 0.4  0.3   Alkaline Phos 33 - 130 U/L  51   AST 10 - 35 U/L 20  22   ALT 6 - 29 U/L 16  17    Lab Results  Component Value Date   TSH 1.28 05/04/2021     Assessment & Plan:   1. Essential hypertension Blood pressure is very normal today. I will continue the HCTZ 25 mg daily for now. However, she had a recent hyponatremia found on a blood panel. I will repeat this today. If this persists, we may need to switch her to a different antihypertensive.  - Basic metabolic panel  2. Acquired hypothyroidism Due for repeat TSH. Plan to continue levothyroxine 75 mcg daily for now.  - TSH  3. Hyponatremia As above. Repeat today.  - Basic metabolic panel   Return in about 3 months (around 01/31/2022) for Reassessment.   Loyola Mast, MD

## 2021-11-28 ENCOUNTER — Encounter: Payer: Self-pay | Admitting: Family Medicine

## 2021-11-28 DIAGNOSIS — E871 Hypo-osmolality and hyponatremia: Secondary | ICD-10-CM | POA: Insufficient documentation

## 2021-11-29 ENCOUNTER — Encounter: Payer: Self-pay | Admitting: Family Medicine

## 2021-11-29 ENCOUNTER — Ambulatory Visit (INDEPENDENT_AMBULATORY_CARE_PROVIDER_SITE_OTHER): Payer: Medicare Other | Admitting: Family Medicine

## 2021-11-29 VITALS — BP 120/78 | HR 63 | Temp 97.5°F | Ht 63.0 in | Wt 112.6 lb

## 2021-11-29 DIAGNOSIS — E871 Hypo-osmolality and hyponatremia: Secondary | ICD-10-CM

## 2021-11-29 LAB — BASIC METABOLIC PANEL
BUN: 21 mg/dL (ref 6–23)
CO2: 29 mEq/L (ref 19–32)
Calcium: 9.7 mg/dL (ref 8.4–10.5)
Chloride: 93 mEq/L — ABNORMAL LOW (ref 96–112)
Creatinine, Ser: 0.79 mg/dL (ref 0.40–1.20)
GFR: 76.39 mL/min (ref >60.00)
Glucose, Bld: 84 mg/dL (ref 70–99)
Potassium: 4.7 mEq/L (ref 3.5–5.1)
Sodium: 129 mEq/L — ABNORMAL LOW (ref 135–145)

## 2021-11-29 NOTE — Progress Notes (Signed)
[facility] [address] [place] [address] Dept: [phone] Dept Fax: [phone]  Office Visit  Subjective:    Patient ID: Kathleen [name], female    DOB: [birth year], 71 y.o..   MRN: [id number]  Chief Complaint  Patient presents with   Follow-up    Follow up labs for low sodium and chloride concerns of feeling tired.    History of Present Illness:  Patient is in today for follow up regarding hyponatremia. Kathleen [name] had been found to have a low sodium on lab work ordered in early August by Dr. Benjamine Mola. When I saw her about a month ago, I repeat the sodium and it remained low. She was recently on a trip out [place], on return, she was seen by her GYN. The sodium remains low. Kathleen [name] has a history of Sjgren's syndrome and associated dry mouth. She does note a tendency to drink a air amount of water to deal with this issue.  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   Essential hypertension 02/27/2021   Chronic right sacroiliac pain 09/09/2020   Osteoporosis 11/29/2018   Piriformis syndrome of right side 10/23/2017   Spondylosis without myelopathy or radiculopathy, 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   Chronic myofascial pain 04/02/2017   ANA positive 01/25/2017   High risk medication use 05/30/2016   Xerostomia 05/03/2015   GERD (gastroesophageal reflux disease) 04/01/2015   IBS (irritable bowel syndrome) 04/01/2015   Insomnia 04/01/2015   Chronic seasonal allergic rhinitis due to pollen 02/04/2015   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   Tinnitus 02/04/2015   Multifactorial low back pain 12/15/2014   Personal history of other malignant neoplasm of  skin 12/08/2011   Chronic back pain 08/24/2011   Hypothyroidism 02/28/1995   Past Surgical History:  Procedure Laterality Date   CATARACT EXTRACTION, BILATERAL     KNEE ARTHROSCOPY W/ MENISCECTOMY Left    Family History  Problem Relation Age of Onset   Stroke Mother    Heart disease Mother    Osteoarthritis Mother    Heart disease Father    Hypertension Father    Heart disease Paternal Uncle    Cancer Paternal Uncle        Lung    Outpatient Medications Prior to Visit  Medication Sig Dispense Refill   acetaminophen (TYLENOL) 325 MG tablet Take 650 mg by mouth every 6 (six) hours as needed.     Alpha-Lipoic Acid 300 MG CAPS      Ascorbic Acid (VITAMIN C) 100 MG tablet Take 100 mg by mouth daily.     azelastine (ASTELIN) 0.1 % nasal spray Place 1 spray into both nostrils 2 (two) times daily.     Calcium Carbonate-Vit D-Min (CALTRATE 600+D PLUS MINERALS PO) Take 1 tablet by mouth daily.     cevimeline (EVOXAC) 30 MG capsule Take 1 capsule (30 mg total) by mouth 3 (three) times daily. 270 capsule 1   cycloSPORINE (RESTASIS) 0.05 % ophthalmic emulsion Place 1 drop into both eyes 2 (two) times daily. 90 each 0   diclofenac sodium (VOLTAREN) 1 % GEL APPLY EXTERNALLY TO THE AFFECTED AREA FOUR TIMES DAILY 300 g 2   DULoxetine (CYMBALTA) 30 MG capsule TAKE 1 CAPSULE BY MOUTH  EVERY DAY 90 capsule 3   fexofenadine (ALLEGRA) 180 MG tablet Take 180 mg by mouth daily.     gabapentin (NEURONTIN) 100 MG capsule TAKE 2 CAPSULES BY MOUTH EVERY MORNING AND 2 CAPSULES BY MOUTH AT LUNCH 360 capsule 3   gabapentin (NEURONTIN) 600 MG tablet TAKE 1 TABLET(600 MG) BY MOUTH AT BEDTIME 90 tablet 3   hydrochlorothiazide (HYDRODIURIL) 25 MG tablet Take 1 tablet (25 mg total) by mouth daily. 90 tablet 3   hydroxychloroquine (PLAQUENIL) 200 MG tablet Take 1 tablet (200 mg total) by mouth daily. 90 tablet 1   IMVEXXY MAINTENANCE PACK 4 MCG INST SMARTSIG:1 Vaginal Twice a Week     levothyroxine (SYNTHROID) 75  MCG tablet Take 75 mcg by mouth daily before breakfast.     Melatonin 5 MG CAPS Take 1 tablet by mouth at bedtime.     meloxicam (MOBIC) 15 MG tablet Take 1 tablet (15 mg total) by mouth daily as needed for pain. 90 tablet 3   montelukast (SINGULAIR) 10 MG tablet Take 1 tablet (10 mg total) by mouth at bedtime. 90 tablet 1   Omega-3 Fatty Acids (FISH OIL) 1000 MG CAPS Take by mouth.     omeprazole (PRILOSEC) 20 MG capsule Take 20 mg by mouth daily.     polyethylene glycol (MIRALAX) 0.34 gm/ml SOLN      Propylene Glycol (SYSTANE BALANCE OP) Apply to eye.     SODIUM FLUORIDE 5000 PPM 1.1 % PSTE See admin instructions.     zoledronic acid (RECLAST) 5 MG/100ML SOLN injection Inject 5 mg into the vein once. Yearly infusion     zolpidem (AMBIEN) 5 MG tablet TAKE 0.5-1 TABLETS (2.5-5 MG TOTAL) BY MOUTH NIGHTLY AS NEEDED FOR SLEEP. 30 tablet 1   No facility-administered medications prior to visit.   Allergies  Allergen Reactions   Clindamycin/Lincomycin Nausea Only   Objective:   Today's Vitals   11/29/21 1047  BP: 120/78  Pulse: 63  Temp: (!) 97.5 F (36.4 C)  TempSrc: Temporal  SpO2: 98%  Weight: 112 lb 9.6 oz (51.1 kg)  Height: 5' 3" (1.6 m)   Body mass index is 19.95 kg/m.   General: Well developed, well nourished. No acute distress. Psych: Alert and oriented. Normal mood and affect.  Health Maintenance Due  Topic Date Due   COVID-19 Vaccine (6 - Pfizer risk series) 02/03/2021   Lab Results    Latest Ref Rng & Units 11/01/2021    1:41 PM 10/05/2021   11:26 AM 05/05/2016    1:52 PM  BMP  Glucose 70 - 99 mg/dL 88  89  89   BUN 6 - 23 mg/dL _0 Creatinine 0.40 - 1.20 mg/dL 0.93  0.82  0.73   BUN/Creat Ratio 6 - 22 (calc)  SEE NOTE:    Sodium 135 - 145 mEq/L 125  127  136   Potassium 3.5 - 5.1 mEq/L 4.2  4.8  4.2   Chloride 96 - 112 mEq/L 91  91  102   CO2 19 - 32 mEq/L _1 Calcium 8.4 - 10.5 mg/dL 9.5  9.8  9.2    Comprehensive Metabolic Panel  (6/56/8127)  Sodium: 129 mEq/L  Potassium: 4.6 mEq/L  Chloride: 90 mEq/L  CO2:  25 mmol/L  Glucose: 80 mg/dL  BUN:  20 mg/dL  Creatinine: 0.81 mg/dL  eGFR:  79 mL/min/1.79m    Assessment & Plan:   1. Hyponatremia I  wonder about whether the increased water intake due to her Xerostomia is now leading to hyponatremia. I will conduct some lab testing to help sort out the likely underlying reason for the low sodium.  - Basic metabolic panel - Osmolality - Osmolality, urine - Sodium, urine, random  Return for After testing/imaging.   Haydee Salter, MD

## 2021-11-30 LAB — OSMOLALITY, URINE: Osmolality, Ur: 676 mOsm/kg (ref 50–1200)

## 2021-11-30 LAB — SODIUM, URINE, RANDOM: Sodium, Ur: 110 mmol/L (ref 28–272)

## 2021-11-30 LAB — OSMOLALITY: Osmolality: 270 mOsm/kg — ABNORMAL LOW (ref 278–305)

## 2021-12-08 ENCOUNTER — Encounter: Payer: Self-pay | Admitting: Physical Medicine & Rehabilitation

## 2021-12-08 ENCOUNTER — Encounter: Payer: Medicare Other | Attending: Physical Medicine & Rehabilitation | Admitting: Physical Medicine & Rehabilitation

## 2021-12-08 VITALS — BP 121/74 | HR 69 | Ht 63.0 in | Wt 113.0 lb

## 2021-12-08 DIAGNOSIS — M47816 Spondylosis without myelopathy or radiculopathy, lumbar region: Secondary | ICD-10-CM | POA: Insufficient documentation

## 2021-12-08 MED ORDER — DIAZEPAM 5 MG PO TABS: 5.0000 mg | ORAL_TABLET | Freq: Four times a day (QID) | ORAL | 0 refills | Status: AC | PRN

## 2021-12-08 NOTE — Progress Notes (Signed)
Subjective:    Patient ID: Kathleen [name], female    DOB: Jul 02, 1950, 71 y.o.   MRN: [id number]  HPI 71 year old female with history of lumbar spondylosis and degenerative disc who has chief complaint of bilateral low back pain.  She has been mainly dealing with right-sided low back pain and has had some benefit from right L3-4-5 RF as well as right sacroiliac RF. Still sees physical therapist once a month and does her stretching and strengthening exercises on a daily basis.  The patient had been working with trainer on core strengthening until she had worsening of her pain.  Worsening of RIght and left side low back starting in June 2023.    Right groin pain with walking and moving hip, does not require assistive device.  Low back pain is worse than the groin pain.  MRI LUMBAR SPINE WITHOUT CONTRAST   TECHNIQUE: Multiplanar, multisequence MR imaging of the lumbar spine was performed. No intravenous contrast was administered.   COMPARISON:  01/30/2015   FINDINGS: Segmentation:  Standard.   Alignment:  2 mm anterolisthesis of L4 on L5 and L5 on S1.   Vertebrae: No fracture, evidence of discitis, or bone lesion. Bilateral L5 pars interarticularis defect.   Conus medullaris and cauda equina: Conus extends to the T12 level. Conus and cauda equina appear normal.   Paraspinal and other soft tissues: No paraspinal abnormality.   Disc levels:   Disc spaces: Disc desiccation throughout the lumbar spine. Disc heights are maintained.   T12-L1: No significant disc bulge. No evidence of neural foraminal stenosis. No central canal stenosis.   L1-L2: No significant disc bulge. No evidence of neural foraminal stenosis. No central canal stenosis.   L2-L3: Minimal broad-based disc bulge. Mild bilateral facet arthropathy. No evidence of neural foraminal stenosis. No central canal stenosis.   L3-L4: Mild broad-based disc bulge. Mild bilateral facet arthropathy. No evidence of neural  foraminal stenosis. No central canal stenosis.   L4-L5: Mild broad-based disc bulge with a central disc protrusion. Bilateral lateral recess stenosis. Moderate bilateral facet arthropathy. No evidence of neural foraminal stenosis. No central canal stenosis.   L5-S1: Broad-based disc bulge. Bilateral mild foraminal narrowing. No central canal stenosis.   IMPRESSION: 1. At L4-5 there is a mild broad-based disc bulge with a central disc protrusion. Bilateral lateral recess stenosis. Moderate bilateral facet arthropathy. 2. No acute injury of the lumbar spine.     Electronically Signed   By: Elige Ko   On: 02/21/2017 14:22 Pain Inventory Average Pain 7 Pain Right Now 7 My pain is sharp, burning, stabbing, and aching  In the last 24 hours, has pain interfered with the following? General activity 6 Relation with others 6 Enjoyment of life 5 What TIME of day is your pain at its worst? morning  Sleep (in general) Fair  Pain is worse with: sitting, inactivity, and standing Pain improves with: heat/ice and therapy/exercise Relief from Meds: 7  Family History  Problem Relation Age of Onset   Stroke Mother    Heart disease Mother    Osteoarthritis Mother    Heart disease Father    Hypertension Father    Heart disease Paternal Uncle    Cancer Paternal Uncle        Lung   Social History   Socioeconomic History   Marital status: Married    Spouse name: Not on file   Number of children: 3   Years of education: Not on file   Highest education level: Not  on file  Occupational History   Occupation: Retired  Tobacco Use   Smoking status: Never   Smokeless tobacco: Never  Vaping Use   Vaping Use: Never used  Substance and Sexual Activity   Alcohol use: Yes    Alcohol/week: 1.0 standard drink of alcohol    Types: 1 Cans of beer per week    Comment: socially   Drug use: Never   Sexual activity: Yes  Other Topics Concern   Not on file  Social History Narrative    Not on file   Social Determinants of Health   Financial Resource Strain: Low Risk  (08/31/2021)   Overall Financial Resource Strain (CARDIA)    Difficulty of Paying Living Expenses: Not hard at all  Food Insecurity: No Food Insecurity (08/31/2021)   Hunger Vital Sign    Worried About Running Out of Food in the Last Year: Never true    Ran Out of Food in the Last Year: Never true  Transportation Needs: No Transportation Needs (08/31/2021)   PRAPARE - Hydrologist (Medical): No    Lack of Transportation (Non-Medical): No  Physical Activity: Sufficiently Active (08/31/2021)   Exercise Vital Sign    Days of Exercise per Week: 5 days    Minutes of Exercise per Session: 40 min  Stress: No Stress Concern Present (08/31/2021)   [place]    Feeling of Stress : Not at all  Social Connections: [place] (08/31/2021)   Social Connection and Isolation Panel [NHANES]    Frequency of Communication with Friends and Family: Three times a week    Frequency of Social Gatherings with Friends and Family: Three times a week    Attends Religious Services: More than 4 times per year    Active Member of Clubs or Organizations: Yes    Attends Music therapist: More than 4 times per year    Marital Status: Married   Past Surgical History:  Procedure Laterality Date   CATARACT EXTRACTION, BILATERAL     KNEE ARTHROSCOPY W/ MENISCECTOMY Left    Past Surgical History:  Procedure Laterality Date   CATARACT EXTRACTION, BILATERAL     KNEE ARTHROSCOPY W/ MENISCECTOMY Left    Past Medical History:  Diagnosis Date   Acid reflux    Hypothyroid    Osteoarthritis    Sjogren's syndrome (HCC)    BP 121/74   Pulse 69   Ht 5\' 3"  (1.6 m)   Wt 113 lb (51.3 kg)   SpO2 99%   BMI 20.02 kg/m   Opioid Risk Score:   Fall Risk Score:  `1  Depression screen [facility] 2/9     12/08/2021   11:58 AM 11/29/2021    10:46 AM 08/31/2021    2:05 PM 08/31/2021    2:03 PM 06/15/2021    1:29 PM 05/04/2021    2:24 PM 11/25/2020   10:17 AM  Depression screen [facility] 2/9  Decreased Interest 0 0 0 0 0 0 0  Down, Depressed, Hopeless 0 0 0 0 0 0 0  PHQ - 2 Score 0 0 0 0 0 0 0      Review of Systems  Musculoskeletal:  Positive for back pain.       Left knee pain  All other systems reviewed and are negative.     Objective:   Physical Exam  General no acute distress Mood and affect are appropriate Lumbar spine has no  evidence of scoliosis.  The patient has normal lumbar range of motion with flexion, extension is 75% lateral bending is 50% of normal. With palpation the patient has some tenderness over the PSIS I-S area bilaterally as well as the lumbosacral junction. Negative straight leg raising bilaterally Hip range of motion is normal bilaterally Negative Faber's negative thigh thrust test Lower extremity strength is normal bilaterally     Assessment & Plan:  1.  Chronic lumbar pain with known lumbar spondylosis at L4-5 particularly her findings on last MRI in 2018 demonstrate bilateral disease although she has been symptomatic on one side.  We discussed that since her current pain worsening has been going on for approximately 4 months despite physical therapy and other conservative care I think repeat MRI would be helpful.  I will see her back in 1 month.  MRI without contrast.  Will need Valium 5 mg 1 to 2 tablets prior to procedure.  She understands that her husband will need to drive.  She does not have implanted devices such as pacemaker

## 2021-12-08 NOTE — Patient Instructions (Signed)
Back Exercises These exercises help to make your trunk and back strong. They also help to keep the lower back flexible. Doing these exercises can help to prevent or lessen pain in your lower back. If you have back pain, try to do these exercises 2-3 times each day or as told by your doctor. As you get better, do the exercises once each day. Repeat the exercises more often as told by your doctor. To stop back pain from coming back, do the exercises once each day, or as told by your doctor. Do exercises exactly as told by your doctor. Stop right away if you feel sudden pain or your pain gets worse. Exercises Single knee to chest Do these steps 3-5 times in a row for each leg: Lie on your back on a firm bed or the floor with your legs stretched out. Bring one knee to your chest. Grab your knee or thigh with both hands and hold it in place. Pull on your knee until you feel a gentle stretch in your lower back or butt. Keep doing the stretch for 10-30 seconds. Slowly let go of your leg and straighten it. Pelvic tilt Do these steps 5-10 times in a row: Lie on your back on a firm bed or the floor with your legs stretched out. Bend your knees so they point up to the ceiling. Your feet should be flat on the floor. Tighten your lower belly (abdomen) muscles to press your lower back against the floor. This will make your tailbone point up to the ceiling instead of pointing down to your feet or the floor. Stay in this position for 5-10 seconds while you gently tighten your muscles and breathe evenly. Cat-cow Do these steps until your lower back bends more easily: Get on your hands and knees on a firm bed or the floor. Keep your hands under your shoulders, and keep your knees under your hips. You may put padding under your knees. Let your head hang down toward your chest. Tighten (contract) the muscles in your belly. Point your tailbone toward the floor so your lower back becomes rounded like the back of a  cat. Stay in this position for 5 seconds. Slowly lift your head. Let the muscles of your belly relax. Point your tailbone up toward the ceiling so your back forms a sagging arch like the back of a cow. Stay in this position for 5 seconds.  Press-ups Do these steps 5-10 times in a row: Lie on your belly (face-down) on a firm bed or the floor. Place your hands near your head, about shoulder-width apart. While you keep your back relaxed and keep your hips on the floor, slowly straighten your arms to raise the top half of your body and lift your shoulders. Do not use your back muscles. You may change where you place your hands to make yourself more comfortable. Stay in this position for 5 seconds. Keep your back relaxed. Slowly return to lying flat on the floor.  Bridges Do these steps 10 times in a row: Lie on your back on a firm bed or the floor. Bend your knees so they point up to the ceiling. Your feet should be flat on the floor. Your arms should be flat at your sides, next to your body. Tighten your butt muscles and lift your butt off the floor until your waist is almost as high as your knees. If you do not feel the muscles working in your butt and the back of   your thighs, slide your feet 1-2 inches (2.5-5 cm) farther away from your butt. Stay in this position for 3-5 seconds. Slowly lower your butt to the floor, and let your butt muscles relax. If this exercise is too easy, try doing it with your arms crossed over your chest. Belly crunches Do these steps 5-10 times in a row: Lie on your back on a firm bed or the floor with your legs stretched out. Bend your knees so they point up to the ceiling. Your feet should be flat on the floor. Cross your arms over your chest. Tip your chin a little bit toward your chest, but do not bend your neck. Tighten your belly muscles and slowly raise your chest just enough to lift your shoulder blades a tiny bit off the floor. Avoid raising your body  higher than that because it can put too much stress on your lower back. Slowly lower your chest and your head to the floor. Back lifts Do these steps 5-10 times in a row: Lie on your belly (face-down) with your arms at your sides, and rest your forehead on the floor. Tighten the muscles in your legs and your butt. Slowly lift your chest off the floor while you keep your hips on the floor. Keep the back of your head in line with the curve in your back. Look at the floor while you do this. Stay in this position for 3-5 seconds. Slowly lower your chest and your face to the floor. Contact a doctor if: Your back pain gets a lot worse when you do an exercise. Your back pain does not get better within 2 hours after you exercise. If you have any of these problems, stop doing the exercises. Do not do them again unless your doctor says it is okay. Get help right away if: You have sudden, very bad back pain. If this happens, stop doing the exercises. Do not do them again unless your doctor says it is okay. This information is not intended to replace advice given to you by your health care provider. Make sure you discuss any questions you have with your health care provider. Document Revised: 04/28/2020 Document Reviewed: 04/28/2020 Elsevier Patient Education  2023 [employer]vier Inc.  

## 2021-12-19 ENCOUNTER — Encounter: Payer: Self-pay | Admitting: Family Medicine

## 2021-12-19 ENCOUNTER — Other Ambulatory Visit: Payer: Self-pay | Admitting: Family Medicine

## 2021-12-19 DIAGNOSIS — M47816 Spondylosis without myelopathy or radiculopathy, lumbar region: Secondary | ICD-10-CM

## 2021-12-31 ENCOUNTER — Ambulatory Visit
Admission: RE | Admit: 2021-12-31 | Discharge: 2021-12-31 | Disposition: A | Discharge status: Home / Self Care | Payer: Medicare Other | Source: Ambulatory Visit | Attending: Physical Medicine & Rehabilitation | Admitting: Physical Medicine & Rehabilitation

## 2021-12-31 DIAGNOSIS — M47816 Spondylosis without myelopathy or radiculopathy, lumbar region: Secondary | ICD-10-CM

## 2022-01-02 ENCOUNTER — Ambulatory Visit (INDEPENDENT_AMBULATORY_CARE_PROVIDER_SITE_OTHER): Payer: Medicare Other | Admitting: Family Medicine

## 2022-01-02 ENCOUNTER — Encounter: Payer: Self-pay | Admitting: Family Medicine

## 2022-01-02 VITALS — BP 116/70 | HR 60 | Temp 96.9°F | Ht 63.0 in | Wt 115.0 lb

## 2022-01-02 DIAGNOSIS — I10 Essential (primary) hypertension: Secondary | ICD-10-CM | POA: Diagnosis not present

## 2022-01-02 DIAGNOSIS — E871 Hypo-osmolality and hyponatremia: Secondary | ICD-10-CM

## 2022-01-02 DIAGNOSIS — E039 Hypothyroidism, unspecified: Secondary | ICD-10-CM

## 2022-01-02 LAB — BASIC METABOLIC PANEL
BUN: 20 mg/dL (ref 6–23)
CO2: 29 mEq/L (ref 19–32)
Calcium: 9.7 mg/dL (ref 8.4–10.5)
Chloride: 100 mEq/L (ref 96–112)
Creatinine, Ser: 0.76 mg/dL (ref 0.40–1.20)
GFR: 79.97 mL/min (ref >60.00)
Glucose, Bld: 89 mg/dL (ref 70–99)
Potassium: 4.3 mEq/L (ref 3.5–5.1)
Sodium: 136 mEq/L (ref 135–145)

## 2022-01-02 MED ORDER — AMLODIPINE BESYLATE 5 MG PO TABS: 5.0000 mg | ORAL_TABLET | Freq: Every day | ORAL | 3 refills | Status: DC

## 2022-01-02 MED ORDER — LEVOTHYROXINE SODIUM 75 MCG PO TABS: 75.0000 ug | ORAL_TABLET | Freq: Every day | ORAL | 3 refills | Status: DC

## 2022-01-02 NOTE — Progress Notes (Signed)
[facility] [address] [place] [address] Dept: [phone] Dept Fax: [phone]  Chronic Care Office Visit  Subjective:    Patient ID: Kathleen [name], female    DOB: Aug 16, 1950, 71 y.o..   MRN: [id number]  Chief Complaint  Patient presents with   Follow-up    1 month f/u.  No concerns.      History of Present Illness:  Patient is in today for reassessment of chronic medical issues.  Kathleen [name] was discovered to have hyponatremia in early August. This has been persistent. She had been taking HCTZ for hypertension. I had her stop this about a month ago. She notes that her blood pressures have been intermittently high at home. She did have some headache associated with the higher blood pressures.   Kathleen [name] has a history of Sjgren's syndrome and associated dry mouth. She does note a tendency to drink a fair amount of water to deal with this issue. She is concerned of what she would do if her hyponatremia were due to SIADH and she needed to restrict ehr water intake.  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   Essential hypertension 02/27/2021   Chronic right sacroiliac pain 09/09/2020   Osteoporosis 11/29/2018   Piriformis syndrome of right side 10/23/2017   Spondylosis without myelopathy or radiculopathy, 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   Chronic myofascial pain 04/02/2017   ANA positive 01/25/2017   High risk medication use 05/30/2016   Xerostomia 05/03/2015   GERD (gastroesophageal reflux disease) 04/01/2015   IBS (irritable bowel syndrome) 04/01/2015   Insomnia 04/01/2015   Chronic seasonal allergic rhinitis due to pollen 02/04/2015   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   Tinnitus 02/04/2015   Multifactorial low back pain 12/15/2014   Personal history of other malignant neoplasm of skin 12/08/2011   Chronic back pain 08/24/2011   Hypothyroidism 02/28/1995   Past Surgical History:  Procedure Laterality Date   CATARACT EXTRACTION, BILATERAL     KNEE ARTHROSCOPY W/ MENISCECTOMY Left    Family History  Problem Relation Age of Onset   Stroke Mother    Heart disease Mother    Osteoarthritis Mother    Heart disease Father    Hypertension Father    Heart disease Paternal Uncle    Cancer Paternal Uncle        Lung   Outpatient Medications Prior to Visit  Medication Sig Dispense Refill   acetaminophen (TYLENOL) 325 MG tablet Take 650 mg by mouth every 6 (six) hours as needed.     Alpha-Lipoic Acid 300 MG CAPS      Ascorbic Acid (VITAMIN C) 100 MG tablet Take 100 mg by mouth daily.     azelastine (ASTELIN) 0.1 % nasal spray Place 1 spray into both nostrils 2 (two) times daily.     Calcium Carbonate-Vit D-Min (CALTRATE 600+D PLUS MINERALS PO) Take 1 tablet by mouth daily.     cevimeline (EVOXAC) 30 MG capsule Take 1 capsule (30 mg total) by mouth 3 (three) times daily. 270 capsule 1   cycloSPORINE (RESTASIS) 0.05 % ophthalmic emulsion Place 1 drop into both eyes 2 (two) times daily. 90 each 0   diazepam (VALIUM) 5 MG tablet Take 1 tablet (5  mg total) by mouth every 6 (six) hours as needed for anxiety. 2 tablet 0   diclofenac sodium (VOLTAREN) 1 % GEL APPLY EXTERNALLY TO THE AFFECTED AREA FOUR TIMES DAILY 300 g 2   DULoxetine (CYMBALTA) 30 MG capsule TAKE 1 CAPSULE BY MOUTH EVERY DAY 90 capsule 3   fexofenadine (ALLEGRA) 180 MG tablet Take 180 mg by mouth daily.     gabapentin (NEURONTIN) 100 MG capsule TAKE 2 CAPSULES (200 MG TOTAL) BY MOUTH 2 TIMES DAILY. 360 capsule 2   gabapentin (NEURONTIN) 600 MG tablet TAKE 1 TABLET(600 MG) BY MOUTH AT BEDTIME 90 tablet 3   hydroxychloroquine (PLAQUENIL) 200 MG tablet Take 1  tablet (200 mg total) by mouth daily. 90 tablet 1   IMVEXXY MAINTENANCE PACK 4 MCG INST SMARTSIG:1 Vaginal Twice a Week     Melatonin 5 MG CAPS Take 1 tablet by mouth at bedtime.     meloxicam (MOBIC) 15 MG tablet Take 1 tablet (15 mg total) by mouth daily as needed for pain. 90 tablet 3   montelukast (SINGULAIR) 10 MG tablet Take 1 tablet (10 mg total) by mouth at bedtime. 90 tablet 1   Omega-3 Fatty Acids (FISH OIL) 1000 MG CAPS Take by mouth.     omeprazole (PRILOSEC) 20 MG capsule Take 20 mg by mouth daily.     polyethylene glycol (MIRALAX) 0.34 gm/ml SOLN      Propylene Glycol (SYSTANE BALANCE OP) Apply to eye.     SODIUM FLUORIDE 5000 PPM 1.1 % PSTE See admin instructions.     zoledronic acid (RECLAST) 5 MG/100ML SOLN injection Inject 5 mg into the vein once. Yearly infusion     zolpidem (AMBIEN) 5 MG tablet TAKE 0.5-1 TABLETS (2.5-5 MG TOTAL) BY MOUTH NIGHTLY AS NEEDED FOR SLEEP. 30 tablet 1   levothyroxine (SYNTHROID) 75 MCG tablet Take 75 mcg by mouth daily before breakfast.     hydrochlorothiazide (HYDRODIURIL) 25 MG tablet Take 1 tablet (25 mg total) by mouth daily. 90 tablet 3   No facility-administered medications prior to visit.   Allergies  Allergen Reactions   Clindamycin/Lincomycin Nausea Only     Objective:   Today's Vitals   01/02/22 1258  BP: 116/70  Pulse: 60  Temp: (!) 96.9 F (36.1 C)  TempSrc: Temporal  SpO2: 97%  Weight: 115 lb (52.2 kg)  Height: 5\' 3"  (1.6 m)   Body mass index is 20.37 kg/m.   General: Well developed, well nourished. No acute distress. Psych: Alert and oriented. Normal mood and affect.  There are no preventive care reminders to display for this patient.    Lab Results: Lab Results  Component Value Date   TSH 1.49 11/01/2021   Assessment & Plan:   1. Hyponatremia We will recheck the sodium level today. If this remains low, I would plan on referral to endocrinology for ruling out adrenal insufficiency.  - Basic metabolic  panel  2. Essential hypertension Blood pressure is normal in clinic today. However, review of home blood pressures show at least half that are elevated. I will go ahead and start her on amlodipine. I will reassess her BP in about a month, at her regularly scheduled follow-up.  - amLODipine (NORVASC) 5 MG tablet; Take 1 tablet (5 mg total) by mouth daily.  Dispense: 30 tablet; Refill: 3  3. Acquired hypothyroidism I will renew her levothyroxine.  - levothyroxine (SYNTHROID) 75 MCG tablet; Take 1 tablet (75 mcg total) by mouth daily before breakfast.  Dispense: 90 tablet;  Refill: 3   Return for As scheduled.   Haydee Salter, MD

## 2022-01-14 ENCOUNTER — Other Ambulatory Visit: Payer: Self-pay | Admitting: Family Medicine

## 2022-01-14 ENCOUNTER — Encounter: Payer: Self-pay | Admitting: Family Medicine

## 2022-01-14 DIAGNOSIS — M47816 Spondylosis without myelopathy or radiculopathy, lumbar region: Secondary | ICD-10-CM

## 2022-01-17 ENCOUNTER — Encounter: Payer: Self-pay | Admitting: Physical Medicine & Rehabilitation

## 2022-01-17 ENCOUNTER — Encounter: Payer: Medicare Other | Attending: Physical Medicine & Rehabilitation | Admitting: Physical Medicine & Rehabilitation

## 2022-01-17 VITALS — BP 133/75 | HR 70 | Ht 63.0 in | Wt 113.4 lb

## 2022-01-17 DIAGNOSIS — G8929 Other chronic pain: Secondary | ICD-10-CM | POA: Insufficient documentation

## 2022-01-17 DIAGNOSIS — M533 Sacrococcygeal disorders, not elsewhere classified: Secondary | ICD-10-CM | POA: Insufficient documentation

## 2022-01-17 DIAGNOSIS — M47816 Spondylosis without myelopathy or radiculopathy, lumbar region: Secondary | ICD-10-CM | POA: Diagnosis present

## 2022-01-17 NOTE — Patient Instructions (Addendum)
Options are to   Repeat Sacroiliac RF in January Sacroiliac fusion Cont PT   Consider lumbar RF

## 2022-01-17 NOTE — Progress Notes (Signed)
Subjective:    Patient ID: Kathleen [name], female    DOB: [birth year], 71 y.o.   MRN: [id number]  HPI 71 year old female with chronic low back pain mainly right-sided.  She has a history of lumbar spondylosis as well as right sacroiliac dysfunction. Last sacroiliac RF was performed 09/09/2020. Last L3-L4 medial branch L5 dorsal ramus radiofrequency performed 05/28/2020. The patient had excellent results until summer 2023 when she had gradually increasing pain in the right low back and buttock area.  Patient is here to review repeat MRI of the lumbar spine.  We went over this line by line as well as looking at the actual films and correlating with lumbar spine model. MRI LUMBAR SPINE WITHOUT CONTRAST   TECHNIQUE: Multiplanar, multisequence MR imaging of the lumbar spine was performed. No intravenous contrast was administered.   COMPARISON:  MRI lumbar spine 02/21/2017   FINDINGS: Segmentation: 5 non rib-bearing lumbar type vertebral bodies are present. The lowest fully formed vertebral body is L5.   Alignment: Slight retrolisthesis at L1-2 and L2-3 is new. Grade 1 anterolisthesis at L4-5 and L5-S1 is stable. Rightward curvature is centered at L1.   Vertebrae: Edematous endplate marrow changes are present anteriorly at L1-2, new since the prior exam. Marrow signal and vertebral body heights are otherwise normal.   Conus medullaris and cauda equina: Conus extends to the L1 level. Conus and cauda equina appear normal.   Paraspinal and other soft tissues: An exophytic cyst of the left kidney is new since the prior exam, measuring 2.3 cm. Recommend no imaging follow-up. Visualized abdomen is otherwise unremarkable.   Disc levels:   T12-L1: Insert normal disc   L1-2: A leftward disc protrusion is new since the prior exam. Mild left foraminal narrowing is present. Central canal is patent.   L2-3: A broad-based disc protrusion mild facet hypertrophy have progressed since the prior  study. The central canal is patent. Mild foraminal narrowing is worse on the right.   L3-4: A mild broad-based disc protrusion is progressed. Mild subarticular and bilateral foraminal narrowing is present.   L4-5: A rightward disc protrusion and annular tear is present. Mild subarticular narrowing is worse right than left. Moderate right foraminal stenosis has progressed.   L5-S1: Mild disc bulging is present without significant stenosis.   IMPRESSION: 1. Progressive multilevel spondylosis of the lumbar spine as described. 2. Mild left foraminal narrowing at L1-2. 3. Mild foraminal narrowing bilaterally at L2-3 is worse on the right. 4. Mild subarticular and bilateral foraminal narrowing at L3-4 has progressed. 5. Mild subarticular narrowing at L4-5 is worse right than left. 6. Moderate right foraminal stenosis at L4-5 has progressed. 7. Mild disc bulging at L5-S1 without significant stenosis.     Electronically Signed   By: Marin Roberts M.D.   On: 01/03/2022 20:35 Pain Inventory Average Pain 8 Pain Right Now 7 My pain is constant, sharp, burning, stabbing, and aching  In the last 24 hours, has pain interfered with the following? General activity 6 Relation with others 4 Enjoyment of life 6 What TIME of day is your pain at its worst? morning  and evening Sleep (in general) NA  Pain is worse with: bending, sitting, inactivity, standing, and some activites Pain improves with: heat/ice, therapy/exercise, pacing activities, and medication Relief from Meds: 6  Family History  Problem Relation Age of Onset   Stroke Mother    Heart disease Mother    Osteoarthritis Mother    Heart disease Father    Hypertension Father  Heart disease Paternal Uncle    Cancer Paternal Uncle        Lung   Social History   Socioeconomic History   Marital status: Married    Spouse name: Not on file   Number of children: 3   Years of education: Not on file   Highest education  level: Not on file  Occupational History   Occupation: Retired  Tobacco Use   Smoking status: Never   Smokeless tobacco: Never  Vaping Use   Vaping Use: Never used  Substance and Sexual Activity   Alcohol use: Yes    Alcohol/week: 1.0 standard drink of alcohol    Types: 1 Cans of beer per week    Comment: socially   Drug use: Never   Sexual activity: Yes  Other Topics Concern   Not on file  Social History Narrative   Not on file   Social Determinants of Health   Financial Resource Strain: Low Risk  (08/31/2021)   Overall Financial Resource Strain (CARDIA)    Difficulty of Paying Living Expenses: Not hard at all  Food Insecurity: No Food Insecurity (08/31/2021)   Hunger Vital Sign    Worried About Running Out of Food in the Last Year: Never true    Ran Out of Food in the Last Year: Never true  Transportation Needs: No Transportation Needs (08/31/2021)   PRAPARE - Administrator, Civil Service (Medical): No    Lack of Transportation (Non-Medical): No  Physical Activity: Sufficiently Active (08/31/2021)   Exercise Vital Sign    Days of Exercise per Week: 5 days    Minutes of Exercise per Session: 40 min  Stress: No Stress Concern Present (08/31/2021)   [employer] of Occupational Health - Occupational Stress Questionnaire    Feeling of Stress : Not at all  Social Connections: Socially Integrated (08/31/2021)   Social Connection and Isolation Panel [NHANES]    Frequency of Communication with Friends and Family: Three times a week    Frequency of Social Gatherings with Friends and Family: Three times a week    Attends Religious Services: More than 4 times per year    Active Member of Clubs or Organizations: Yes    Attends Engineer, structural: More than 4 times per year    Marital Status: Married   Past Surgical History:  Procedure Laterality Date   CATARACT EXTRACTION, BILATERAL     KNEE ARTHROSCOPY W/ MENISCECTOMY Left    Past Surgical History:   Procedure Laterality Date   CATARACT EXTRACTION, BILATERAL     KNEE ARTHROSCOPY W/ MENISCECTOMY Left    Past Medical History:  Diagnosis Date   Acid reflux    Hypothyroid    Osteoarthritis    Sjogren's syndrome (HCC)    BP 133/75   Pulse 70   Ht 5\' 3"  (1.6 m)   Wt 113 lb 6.4 oz (51.4 kg)   SpO2 98%   BMI 20.09 kg/m   Opioid Risk Score:   Fall Risk Score:  `1  Depression screen [facility] 2/9     01/17/2022   11:49 AM 12/08/2021   11:58 AM 11/29/2021   10:46 AM 08/31/2021    2:05 PM 08/31/2021    2:03 PM 06/15/2021    1:29 PM 05/04/2021    2:24 PM  Depression screen PHQ 2/9  Decreased Interest 0 0 0 0 0 0 0  Down, Depressed, Hopeless 0 0 0 0 0 0 0  PHQ - 2 Score 0  0 0 0 0 0 0     Review of Systems  Constitutional: Negative.   HENT: Negative.    Eyes: Negative.   Respiratory: Negative.    Cardiovascular: Negative.   Gastrointestinal: Negative.   Endocrine: Negative.   Genitourinary: Negative.   Musculoskeletal: Negative.   Allergic/Immunologic: Negative.   Neurological: Negative.   Hematological: Negative.   Psychiatric/Behavioral: Negative.    All other systems reviewed and are negative.      Objective:   Physical Exam  Patient has tenderness palpation lumbar paraspinals as well as right PSIS area There is pain with lumbar extension greater than with flexion she has full lumbar flexion.  Sacral thrust (prone) : Positive Lateral compression: Negative FABER's: Negative Distraction (supine): Positive right Thigh thrust test: Positive right      Assessment & Plan:  1.  Chronic right low back and buttocks pain multifactorial the patient has both lumbar spondylosis which is also seen on repeat MRI performed 12/30/2021.  In addition the patient has sacroiliac disorder right side with positive provocative maneuvers as above. She has had excellent results for greater than 1 year after radiofrequency neurotomy of the right L3-L4 medial branch and L5 dorsal ramus  followed by right sacroiliac RF. We discussed repeating the lumbar RF, followed by sacroiliac RF a month or 2 later. 40 minutes were spent explaining findings on MRI.  As well is discussing treatment options

## 2022-01-27 ENCOUNTER — Encounter: Payer: Self-pay | Admitting: Family Medicine

## 2022-01-31 ENCOUNTER — Encounter: Payer: Self-pay | Admitting: Family Medicine

## 2022-01-31 ENCOUNTER — Ambulatory Visit (INDEPENDENT_AMBULATORY_CARE_PROVIDER_SITE_OTHER): Payer: Medicare Other | Admitting: Family Medicine

## 2022-01-31 VITALS — BP 112/64 | HR 63 | Temp 97.3°F | Ht 63.0 in | Wt 113.2 lb

## 2022-01-31 DIAGNOSIS — E039 Hypothyroidism, unspecified: Secondary | ICD-10-CM

## 2022-01-31 DIAGNOSIS — E871 Hypo-osmolality and hyponatremia: Secondary | ICD-10-CM

## 2022-01-31 DIAGNOSIS — I10 Essential (primary) hypertension: Secondary | ICD-10-CM

## 2022-01-31 NOTE — Progress Notes (Signed)
[facility] [address] [place] [address] Dept: [phone] Dept Fax: [phone]  Chronic Care Office Visit  Subjective:    Patient ID: Kathleen [name], female    DOB: 05/07/1950, 71 y.o..   MRN: [id number]  Chief Complaint  Patient presents with   Follow-up    3 month f/u.  No concerns.  Not fasting .     History of Present Illness:  Patient is in today for reassessment of chronic medical issues.  Kathleen [name] was diagnosed with hypertension earlier this year.  I started her on HCTZ 25 mg daily. However, she developed an issue with hyponatremia. Since switching her to amlodipine, her sodium has normalized. She is tolerating amlodipine 5 mg daily. Although she is checking some home Bps, these have not been consistent.   Kathleen [name] has a history of hypothyroidism. She is managed on levothyroxine 75 mcg daily.  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   Essential hypertension 02/27/2021   Chronic right sacroiliac pain 09/09/2020   Osteoporosis 11/29/2018   Piriformis syndrome of right side 10/23/2017   Spondylosis without myelopathy or radiculopathy, 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   Chronic myofascial pain 04/02/2017   ANA positive 01/25/2017   High risk medication use 05/30/2016   Xerostomia 05/03/2015   GERD (gastroesophageal reflux disease) 04/01/2015   IBS (irritable bowel syndrome) 04/01/2015   Insomnia 04/01/2015   Chronic seasonal allergic rhinitis due to pollen 02/04/2015   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   Tinnitus 02/04/2015   Multifactorial low back pain 12/15/2014   Personal history of other malignant neoplasm of skin  12/08/2011   Chronic back pain 08/24/2011   Hypothyroidism 02/28/1995   Past Surgical History:  Procedure Laterality Date   CATARACT EXTRACTION, BILATERAL     KNEE ARTHROSCOPY W/ MENISCECTOMY Left    Family History  Problem Relation Age of Onset   Stroke Mother    Heart disease Mother    Osteoarthritis Mother    Heart disease Father    Hypertension Father    Heart disease Paternal Uncle    Cancer Paternal Uncle        Lung   Outpatient Medications Prior to Visit  Medication Sig Dispense Refill   acetaminophen (TYLENOL) 325 MG tablet Take 650 mg by mouth every 6 (six) hours as needed.     Alpha-Lipoic Acid 300 MG CAPS      amLODipine (NORVASC) 5 MG tablet Take 1 tablet (5 mg total) by mouth daily. 30 tablet 3   Ascorbic Acid (VITAMIN C) 100 MG tablet Take 100 mg by mouth daily.     azelastine (ASTELIN) 0.1 % nasal spray Place 1 spray into both nostrils 2 (two) times daily.     Calcium Carbonate-Vit D-Min (CALTRATE 600+D PLUS MINERALS PO) Take 1 tablet by mouth daily.     cevimeline (EVOXAC) 30 MG capsule Take 1 capsule (30 mg total) by mouth 3 (three) times daily. 270 capsule 1   cycloSPORINE (RESTASIS) 0.05 % ophthalmic emulsion Place 1 drop into both eyes 2 (two) times daily. 90 each 0   diazepam (VALIUM) 5 MG tablet Take 1 tablet (5 mg total) by mouth every 6 (six) hours as needed for anxiety. 2 tablet 0  diclofenac sodium (VOLTAREN) 1 % GEL APPLY EXTERNALLY TO THE AFFECTED AREA FOUR TIMES DAILY 300 g 2   DULoxetine (CYMBALTA) 30 MG capsule TAKE 1 CAPSULE BY MOUTH EVERY DAY 90 capsule 3   gabapentin (NEURONTIN) 100 MG capsule TAKE 2 CAPSULES (200 MG TOTAL) BY MOUTH 2 TIMES DAILY. 360 capsule 2   gabapentin (NEURONTIN) 600 MG tablet TAKE 1 TABLET BY MOUTH EVERY DAY 90 tablet 3   hydroxychloroquine (PLAQUENIL) 200 MG tablet Take 1 tablet (200 mg total) by mouth daily. 90 tablet 1   IMVEXXY MAINTENANCE PACK 4 MCG INST SMARTSIG:1 Vaginal Twice a Week     levothyroxine (SYNTHROID)  75 MCG tablet Take 1 tablet (75 mcg total) by mouth daily before breakfast. 90 tablet 3   Melatonin 5 MG CAPS Take 1 tablet by mouth at bedtime.     meloxicam (MOBIC) 15 MG tablet Take 1 tablet (15 mg total) by mouth daily as needed for pain. 90 tablet 3   montelukast (SINGULAIR) 10 MG tablet Take 1 tablet (10 mg total) by mouth at bedtime. 90 tablet 1   Omega-3 Fatty Acids (FISH OIL) 1000 MG CAPS Take by mouth.     omeprazole (PRILOSEC) 20 MG capsule Take 20 mg by mouth daily.     polyethylene glycol (MIRALAX) 0.34 gm/ml SOLN      Propylene Glycol (SYSTANE BALANCE OP) Apply to eye.     SODIUM FLUORIDE 5000 PPM 1.1 % PSTE See admin instructions.     zoledronic acid (RECLAST) 5 MG/100ML SOLN injection Inject 5 mg into the vein once. Yearly infusion     zolpidem (AMBIEN) 5 MG tablet TAKE 0.5-1 TABLETS (2.5-5 MG TOTAL) BY MOUTH NIGHTLY AS NEEDED FOR SLEEP. 30 tablet 1   fexofenadine (ALLEGRA) 180 MG tablet Take 180 mg by mouth daily. (Patient not taking: Reported on 01/31/2022)     No facility-administered medications prior to visit.   Allergies  Allergen Reactions   Clindamycin/Lincomycin Nausea Only    Objective:   Today's Vitals   01/31/22 1044  BP: 112/64  Pulse: 63  Temp: (!) 97.3 F (36.3 C)  TempSrc: Temporal  SpO2: 96%  Weight: 113 lb 3.2 oz (51.3 kg)  Height: 5\' 3"  (1.6 m)   Body mass index is 20.05 kg/m.   General: Well developed, well nourished. No acute distress. Psych: Alert and oriented. Normal mood and affect.  There are no preventive care reminders to display for this patient.  Lab Results    Latest Ref Rng & Units 01/02/2022    1:33 PM 11/29/2021   11:57 AM 11/01/2021    1:41 PM  BMP  Glucose 70 - 99 mg/dL 89  84  88   BUN 6 - 23 mg/dL 20  21  21    Creatinine 0.40 - 1.20 mg/dL 01/01/2022   4.25   Sodium 135 - 145 mEq/L 136  129  125   Potassium 3.5 - 5.1 mEq/L 4.3  4.7  4.2   Chloride 96 - 112 mEq/L 100  93  91   CO2 19 - 32 mEq/L 29  29  25    Calcium 8.4  - 10.5 mg/dL 9.7  9.7  9.5      Assessment & Plan:   1. Essential hypertension Blood pressure is in good control int he office today. She has had an occasional high blood pressure at home. I asked her to more consistently monitor this and let me know how she is doing. I will have her continue  the amlodipine 5 mg daily.  2. Hyponatremia Resolved. This was likely due to diuretic use.  3. Acquired hypothyroidism Continue levothyroxine 75 mcg daily. Plan to recheck TSH at next visit.  Return in about 3 months (around 05/02/2022) for Reassessment.   Loyola Mast, MD

## 2022-02-06 ENCOUNTER — Encounter: Payer: Self-pay | Admitting: Physical Medicine & Rehabilitation

## 2022-03-03 ENCOUNTER — Ambulatory Visit: Payer: Medicare Other | Admitting: Physical Medicine & Rehabilitation

## 2022-03-15 ENCOUNTER — Other Ambulatory Visit: Payer: Self-pay | Admitting: Family Medicine

## 2022-03-15 DIAGNOSIS — M47816 Spondylosis without myelopathy or radiculopathy, lumbar region: Secondary | ICD-10-CM

## 2022-03-15 DIAGNOSIS — I10 Essential (primary) hypertension: Secondary | ICD-10-CM

## 2022-03-29 ENCOUNTER — Other Ambulatory Visit: Payer: Self-pay | Admitting: Internal Medicine

## 2022-03-29 ENCOUNTER — Encounter: Payer: Self-pay | Admitting: Family Medicine

## 2022-03-30 MED ORDER — MONTELUKAST SODIUM 10 MG PO TABS: 10.0000 mg | ORAL_TABLET | Freq: Every day | ORAL | 1 refills | Status: DC

## 2022-03-30 NOTE — Addendum Note (Signed)
Addended by: Konrad Saha on: 03/30/2022 02:52 PM   Modules accepted: Orders

## 2022-04-04 ENCOUNTER — Ambulatory Visit: Payer: Medicare Other | Admitting: Physical Medicine & Rehabilitation

## 2022-04-10 NOTE — Progress Notes (Signed)
Office Visit Note  Patient: Kathleen [name]             Date of Birth: October 05, 1950           MRN: [id number]             PCP: Haydee Salter, MD Referring: Haydee Salter, MD Visit Date: 04/11/2022   Subjective:  Follow-up (Patient states she has had increased pain in her back.)   History of Present Illness: Kathleen [name] is a 72 y.o. female here for follow up for Sjogren's syndrome on hydroxychloroquine 200 mg daily and cevimeline 30 mg 3 times daily.  Currently taking meloxicam 15 mg daily consistently worst area of pain currently in the back but also getting a little bit worse in her hands compared to last visit.  She is scheduled for lumbar spine radiofrequency ablation on the 20th.  On Systane and Restasis eyedrops.  She feels like her concentration and brain fog is also slightly worse than usual in the past month or 2.  Previous HPI 10/05/21 Kathleen [name] is a 72 y.o. female here for follow up for sjogren's syndrome on HCQ 200 mg daily. She is taking cevimeline 30 mg TID for dry mouth and using restasis and lubricating eye drops for dry eyes. She started using the Tyrvaya in the past 2 weeks so far not sure about the amount of difference. Eye dryness has possibly been a bit increased recently. She has pain and stiffness in her hands is bothersome as well. She takes meloxicam gabapentin and duloxetine largely for back pain but also som help for hands. She has not seen much difference when try topical diclofenac. Saw Dr. Amedeo Plenty for evaluation of options with a finger cyst and her finger deformities, but currently holding off on any aggressive steps.   Previous HPI 04/07/2021 Kathleen [name] is a 72 y.o. female here for sjogren's syndrome on hydroxycloroquine 200 mg daily and cevimeline TID for dry mouth. She previously saw Dr. Franki Monte for this problem.  She was originally diagnosed after establishing care with him in 2016 with existing problems of chronic arthritis affecting facet joints  knees and hands.  Work-up revealed a positive ANA with significant eye and mouth dryness symptoms no specific extractable nuclear antibodies identified.  She was not sure since a specific time point her symptoms developed somewhat progressive over preceding years.  She was started on hydroxychloroquine with partial symptom improvement as well as continuing topical diclofenac, oral meloxicam, gabapentin, and Cymbalta for chronic degenerative joint pain especially from the back.  Symptoms in her fingers and knees are persistent but not severely limiting of activity.  She has noticed some progression of the finger deformities over the past several years but usually without acute swelling redness or warmth. For her dry mouth symptoms she uses a Biotene spray drinks frequently and xylitol oral lozenges.  She takes cevimeline 3 times daily with a good benefit and no significant intolerance.  She denies any pain or swelling around major salivary glands or cervical lymphadenopathy. For dry eyes she uses Systane drops also using Restasis drops in both eyes twice daily with a good benefit.  She never experiences any periorbital swelling has no previous major abrasions or other complications.  She has regular follow-up with her eye Dr. Thalia Bloodgood at [facility] most recent in August 2022 with normal OCT testing and annual follow-up.  She most recently was started on a trial of Tyrvaya nasal spray she is not sure if there was  a large change in symptoms but also did not continue the medicine for a long time due to lack of follow-up and finishing the sample amount.     Labs reviewed 06/2017 SSA neg SSB neg   12/2014 ANA 1:160 homogenous   Review of Systems  Constitutional:  Positive for fatigue.  HENT:  Positive for mouth dryness. Negative for mouth sores.   Eyes:  Positive for dryness.  Respiratory:  Negative for shortness of breath.   Cardiovascular:  Negative for chest pain and palpitations.   Gastrointestinal:  Negative for blood in stool, constipation and diarrhea.  Endocrine: Negative for increased urination.  Genitourinary:  Negative for involuntary urination.  Musculoskeletal:  Positive for joint pain, joint pain, myalgias, morning stiffness, muscle tenderness and myalgias. Negative for gait problem, joint swelling and muscle weakness.  Skin:  Negative for color change, rash, hair loss and sensitivity to sunlight.  Allergic/Immunologic: Negative for susceptible to infections.  Neurological:  Positive for headaches. Negative for dizziness.  Hematological:  Negative for swollen glands.  Psychiatric/Behavioral:  Negative for depressed mood and sleep disturbance. The patient is not nervous/anxious.     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   Essential hypertension 02/27/2021   Chronic right sacroiliac pain 09/09/2020   Osteoporosis 11/29/2018   Piriformis syndrome of right side 10/23/2017   Spondylosis without myelopathy or radiculopathy, 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   Chronic myofascial pain 04/02/2017   ANA positive 01/25/2017   High risk medication use 05/30/2016   Xerostomia 05/03/2015   GERD (gastroesophageal reflux disease) 04/01/2015   IBS (irritable bowel syndrome) 04/01/2015   Insomnia 04/01/2015   Chronic seasonal allergic rhinitis due to pollen 02/04/2015   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   Tinnitus 02/04/2015   Multifactorial low back pain 12/15/2014   Personal history of other malignant neoplasm of skin 12/08/2011   Chronic back pain 08/24/2011   Hypothyroidism 02/28/1995    Past Medical History:  Diagnosis Date   Acid reflux    Hypothyroid    Osteoarthritis    Sjogren's syndrome ([place])    Spondylosis of  lumbar spine     Family History  Problem Relation Age of Onset   Stroke Mother    Heart disease Mother    Osteoarthritis Mother    Heart disease Father    Hypertension Father    Heart disease Paternal Uncle    Cancer Paternal Uncle        Lung   Past Surgical History:  Procedure Laterality Date   CATARACT EXTRACTION, BILATERAL     KNEE ARTHROSCOPY W/ MENISCECTOMY Left    Social History   Social History Narrative   Not on file   Immunization History  Administered Date(s) Administered   Fluad Quad(high Dose 65+) 12/04/2018   Influenza Split 12/12/2015, 11/27/2020, 12/06/2021   Influenza, High Dose Seasonal PF 12/24/2017   PFIZER Comirnaty(Gray Top)Covid-19 Tri-Sucrose Vaccine 03/24/2019, 04/14/2019, 12/15/2019, 06/14/2020   Pfizer Covid-19 Vaccine Bivalent Booster 1yr & up 12/09/2020, 12/06/2021   Pneumococcal Conjugate-13 11/29/2018   Pneumococcal Polysaccharide-23 08/12/2020   Tdap 03/26/2008, 10/07/2018   Zoster Recombinat (Shingrix) 11/26/2017, 02/05/2018   Zoster, Live 11/23/2011, 12/14/2011     Objective: Vital Signs: BP 128/77 (BP Location: Left Arm, Patient Position: Sitting, Cuff Size: Small)  Pulse (!) 58   Resp 16   Ht '5\' 3"'$  (1.6 m)   Wt 115 lb (52.2 kg)   BMI 20.37 kg/m    Physical Exam HENT:     Mouth/Throat:     Mouth: Mucous membranes are dry.     Pharynx: Oropharynx is clear.  Eyes:     Conjunctiva/sclera: Conjunctivae normal.  Cardiovascular:     Rate and Rhythm: Normal rate and regular rhythm.  Pulmonary:     Effort: Pulmonary effort is normal.     Breath sounds: Normal breath sounds.  Lymphadenopathy:     Cervical: No cervical adenopathy.  Skin:    General: Skin is warm and dry.     Findings: No rash.  Neurological:     Mental Status: She is alert.  Psychiatric:        Mood and Affect: Mood normal.      Musculoskeletal Exam:  Shoulders full ROM no tenderness or swelling Elbows full ROM no tenderness or swelling Wrists full  ROM no tenderness or swelling Fingers first CMC joint squaring, PIP and DIP joint bony nodules some lateral deviation of the right second and third fingers, some soft tissue swelling or hypertrophy palpable at right second PIP and third DIP joints Knees full ROM no tenderness or swelling Ankles full ROM no tenderness or swelling   Investigation: No additional findings.  Imaging: No results found.  Recent Labs: Lab Results  Component Value Date   WBC 5.3 04/11/2022   HGB 13.0 04/11/2022   PLT 323 04/11/2022   NA 134 (L) 05/02/2022   K 4.4 05/02/2022   CL 99 05/02/2022   CO2 26 05/02/2022   GLUCOSE 81 05/02/2022   BUN 20 05/02/2022   CREATININE 0.83 05/02/2022   BILITOT 0.4 10/05/2021   ALKPHOS 51 05/05/2016   AST 20 10/05/2021   ALT 16 10/05/2021   PROT 6.6 10/05/2021   ALBUMIN 4.5 05/05/2016   CALCIUM 9.6 05/02/2022    Speciality Comments: No specialty comments available.  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  Will recheck sed rate CRP and serum complements for monitoring disease activity.  Plan to continue on hydroxychloroquine 200 mg daily and the exovac 30 mg 3 times daily.  High risk medication use - Plan: CBC with Differential/Platelet  Checking a CBC also reminded need for ophthalmology follow-up with retinal toxicity screening for long-term use of hydroxychloroquine.  Dry eye syndrome of both eyes  Continue Systane and Restasis eyedrops.  Multifactorial low back pain  Currently her most limiting joint pain as a need for the daily meloxicam.  Scheduled for RFA coming up in a week.  Orders: Orders Placed This Encounter  Procedures   Sedimentation rate   C-reactive protein   C3 and C4   CBC with  Differential/Platelet   No orders of the defined types were placed in this encounter.    Follow-Up Instructions: Return in about 6 months (around 10/10/2022) for pSS on HCQ and cev f/u 80mo.   CCollier Salina MD  Note - This record has been created using [employer]  Chart creation errors have been sought, but may not always  have been located. Such creation errors do not reflect on  the standard of medical care.

## 2022-04-11 ENCOUNTER — Encounter: Payer: Self-pay | Admitting: Internal Medicine

## 2022-04-11 ENCOUNTER — Ambulatory Visit: Payer: Medicare Other | Attending: Internal Medicine | Admitting: Internal Medicine

## 2022-04-11 VITALS — BP 128/77 | HR 58 | Resp 16 | Ht 63.0 in | Wt 115.0 lb

## 2022-04-11 DIAGNOSIS — M47816 Spondylosis without myelopathy or radiculopathy, lumbar region: Secondary | ICD-10-CM | POA: Insufficient documentation

## 2022-04-11 DIAGNOSIS — H04123 Dry eye syndrome of bilateral lacrimal glands: Secondary | ICD-10-CM | POA: Diagnosis not present

## 2022-04-11 DIAGNOSIS — M3500 Sicca syndrome, unspecified: Secondary | ICD-10-CM | POA: Diagnosis not present

## 2022-04-11 DIAGNOSIS — Z79899 Other long term (current) drug therapy: Secondary | ICD-10-CM | POA: Diagnosis not present

## 2022-04-11 DIAGNOSIS — M19041 Primary osteoarthritis, right hand: Secondary | ICD-10-CM | POA: Insufficient documentation

## 2022-04-11 DIAGNOSIS — M19042 Primary osteoarthritis, left hand: Secondary | ICD-10-CM | POA: Insufficient documentation

## 2022-04-11 NOTE — Patient Instructions (Signed)
Regarding fatigue and brain fog symptoms, I recommend checking out the [facility] patient-centered guide for fibromyalgia and chronic pain management: [email or web address]

## 2022-04-12 LAB — CBC WITH DIFFERENTIAL/PLATELET
Absolute Monocytes: 456 cells/uL (ref 200–950)
Basophils Absolute: 21 cells/uL (ref 0–200)
Basophils Relative: 0.4 %
Eosinophils Absolute: 133 cells/uL (ref 15–500)
Eosinophils Relative: 2.5 %
HCT: 38.4 % (ref 35.0–45.0)
Hemoglobin: 13 g/dL (ref 11.7–15.5)
Lymphs Abs: 1193 cells/uL (ref 850–3900)
MCH: 31 pg (ref 27.0–33.0)
MCHC: 33.9 g/dL (ref 32.0–36.0)
MCV: 91.4 fL (ref 80.0–100.0)
MPV: 9.5 fL (ref 7.5–12.5)
Monocytes Relative: 8.6 %
Neutro Abs: 3498 cells/uL (ref 1500–7800)
Neutrophils Relative %: 66 %
Platelets: 323 10*3/uL (ref 140–400)
RBC: 4.2 10*6/uL (ref 3.80–5.10)
RDW: 12.4 % (ref 11.0–15.0)
Total Lymphocyte: 22.5 %
WBC: 5.3 10*3/uL (ref 3.8–10.8)

## 2022-04-12 LAB — C-REACTIVE PROTEIN: CRP: 0.6 mg/L (ref <8.0)

## 2022-04-12 LAB — SEDIMENTATION RATE: Sed Rate: 2 mm/h (ref 0–30)

## 2022-04-12 LAB — C3 AND C4
C3 Complement: 97 mg/dL (ref 83–193)
C4 Complement: 20 mg/dL (ref 15–57)

## 2022-04-17 ENCOUNTER — Telehealth: Payer: Self-pay | Admitting: Physical Medicine & Rehabilitation

## 2022-04-17 ENCOUNTER — Telehealth: Payer: Self-pay | Admitting: Family Medicine

## 2022-04-17 NOTE — Telephone Encounter (Signed)
Patient has COVID. She was scheduled for Lumbar L3-4-5 RF Right side  this week. Your first available is 3/14. She is scheduled for Sacroiliac RF on 3/19. Is this too close together? If so, which one should be first?

## 2022-04-17 NOTE — Telephone Encounter (Signed)
Patient scheduled 04/18/22. Dm/cma

## 2022-04-17 NOTE — Telephone Encounter (Signed)
Pt stated she covid positive Friday and wanted to know if you can prescribe her medication

## 2022-04-18 ENCOUNTER — Telehealth (INDEPENDENT_AMBULATORY_CARE_PROVIDER_SITE_OTHER): Payer: Medicare Other | Admitting: Family Medicine

## 2022-04-18 ENCOUNTER — Encounter: Payer: Self-pay | Admitting: Family Medicine

## 2022-04-18 ENCOUNTER — Encounter: Payer: Medicare Other | Admitting: Physical Medicine & Rehabilitation

## 2022-04-18 VITALS — Ht 63.0 in | Wt 115.0 lb

## 2022-04-18 DIAGNOSIS — J301 Allergic rhinitis due to pollen: Secondary | ICD-10-CM

## 2022-04-18 DIAGNOSIS — U071 COVID-19: Secondary | ICD-10-CM | POA: Insufficient documentation

## 2022-04-18 MED ORDER — BUDESONIDE 32 MCG/ACT NA SUSP: 1.0000 | Freq: Every day | NASAL | 5 refills | Status: AC

## 2022-04-18 MED ORDER — NIRMATRELVIR/RITONAVIR (PAXLOVID)TABLET: 3.0000 | ORAL_TABLET | Freq: Two times a day (BID) | ORAL | 0 refills | Status: AC

## 2022-04-18 NOTE — Assessment & Plan Note (Signed)
Reviewed home care instructions for COVID including rest, pushing fluids, and OTC medications as needed for symptom relief. Recommend hot tea with honey for sore throat symptoms. I will prescribe Paxlovid. Advised self-isolation at home for at least 5 days. After 5 days, if improved and fever resolved, can be in public, but should wear a mask around others for an additional 5 days. If symptoms, esp, dyspnea develops/worsens, recommend in-person evaluation at either an urgent care or the emergency room.

## 2022-04-18 NOTE — Assessment & Plan Note (Signed)
I will renew her budesonide and see if insurance will cover the cost of this.

## 2022-04-18 NOTE — Progress Notes (Signed)
[facility] [address] [place] [address] Dept: [phone] Dept Fax: [phone]  Virtual Video Visit  I connected with [name] on 04/18/22 at  8:20 AM EST by a video enabled telemedicine application and verified that I am speaking with the correct person using two identifiers.  Location patient: Home Location provider: Clinic Persons participating in the virtual visit: Patient, Provider  I discussed the limitations of evaluation and management by telemedicine and the availability of in person appointments. The patient expressed understanding and agreed to proceed.  Chief Complaint  Patient presents with   Cough    Sneezing, HA, head/chest congestion, fatigue, cough x  5 days. Positive 04/15/22.  Has taken Sudafed, Benzonate caps and nasal spray   SUBJECTIVE:  HPI: Kathleen [name] is a 72 y.o. female who presents with a 5-day history of illness, which began with sore throat and fatigue. This progressed into sneezing, headache, nasal congestion and a dry cough. She has not run fever. She performed a home COVID test on 2/17, which was positive. Ms. Kathleen [name] is using Sudafed and Tessalon for symptom management. She has thought about restarting her budesonide nasal spray which she uses for seasonal allergies.  Patient Active Problem List   Diagnosis Date Noted   Hyponatremia 11/28/2021   Family history of serrated polyposis syndrome (SPS)- Sister 11/01/2021   Dry eye syndrome of both eyes 10/18/2021   Sjogren's syndrome ([place]) 04/07/2021   Osteoarthritis of hands, bilateral 04/07/2021   Essential hypertension 02/27/2021   Chronic right sacroiliac pain 09/09/2020   Osteoporosis 11/29/2018   Piriformis syndrome of right side 10/23/2017   Spondylosis without myelopathy or radiculopathy, 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   Chronic myofascial pain 04/02/2017   ANA positive 01/25/2017   High risk medication use 05/30/2016    Xerostomia 05/03/2015   GERD (gastroesophageal reflux disease) 04/01/2015   IBS (irritable bowel syndrome) 04/01/2015   Insomnia 04/01/2015   Chronic seasonal allergic rhinitis due to pollen 02/04/2015   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   Tinnitus 02/04/2015   Multifactorial low back pain 12/15/2014   Personal history of other malignant neoplasm of skin 12/08/2011   Chronic back pain 08/24/2011   Hypothyroidism 02/28/1995   Past Surgical History:  Procedure Laterality Date   CATARACT EXTRACTION, BILATERAL     KNEE ARTHROSCOPY W/ MENISCECTOMY Left    Family History  Problem Relation Age of Onset   Stroke Mother    Heart disease Mother    Osteoarthritis Mother    Heart disease Father    Hypertension Father    Heart disease Paternal Uncle    Cancer Paternal Uncle        Lung   Social History   Tobacco Use   Smoking status: Never    Passive exposure: Never   Smokeless tobacco: Never  Vaping Use   Vaping Use: Never used  Substance Use Topics   Alcohol use: Yes    Alcohol/week: 1.0 standard drink of alcohol    Types: 1 Cans of beer per week    Comment: socially   Drug use: Never    Current Outpatient Medications:    acetaminophen (TYLENOL) 325 MG tablet, Take 650 mg by mouth every 6 (six) hours as needed., Disp: , Rfl:    Alpha-Lipoic Acid 300 MG CAPS, , Disp: , Rfl:    amLODipine (NORVASC) 5 MG tablet, TAKE 1 TABLET (5 MG TOTAL) BY MOUTH DAILY., Disp: 90 tablet, Rfl: 1   Ascorbic Acid (  VITAMIN C) 100 MG tablet, Take 100 mg by mouth daily., Disp: , Rfl:    azelastine (ASTELIN) 0.1 % nasal spray, Place 1 spray into both nostrils 2 (two) times daily., Disp: , Rfl:    budesonide ([employer] BUDESONIDE) 32 MCG/ACT nasal spray, Place 1 spray into both nostrils daily., Disp: 8.43 mL, Rfl: 5   Calcium Carbonate-Vit D-Min (CALTRATE 600+D PLUS MINERALS PO), Take 1 tablet by mouth daily., Disp: , Rfl:    cevimeline (EVOXAC) 30 MG capsule, Take 1 capsule (30 mg total) by  mouth 3 (three) times daily., Disp: 270 capsule, Rfl: 1   cycloSPORINE (RESTASIS) 0.05 % ophthalmic emulsion, Place 1 drop into both eyes 2 (two) times daily., Disp: 90 each, Rfl: 0   diazepam (VALIUM) 5 MG tablet, Take 1 tablet (5 mg total) by mouth every 6 (six) hours as needed for anxiety., Disp: 2 tablet, Rfl: 0   diclofenac sodium (VOLTAREN) 1 % GEL, APPLY EXTERNALLY TO THE AFFECTED AREA FOUR TIMES DAILY, Disp: 300 g, Rfl: 2   DULoxetine (CYMBALTA) 30 MG capsule, TAKE 1 CAPSULE BY MOUTH EVERY DAY, Disp: 90 capsule, Rfl: 3   fexofenadine (ALLEGRA) 180 MG tablet, Take 180 mg by mouth daily., Disp: , Rfl:    gabapentin (NEURONTIN) 100 MG capsule, TAKE 2 CAPSULES (200 MG TOTAL) BY MOUTH 2 TIMES DAILY., Disp: 360 capsule, Rfl: 2   gabapentin (NEURONTIN) 600 MG tablet, TAKE 1 TABLET BY MOUTH EVERY DAY, Disp: 90 tablet, Rfl: 3   hydroxychloroquine (PLAQUENIL) 200 MG tablet, Take 1 tablet (200 mg total) by mouth daily., Disp: 90 tablet, Rfl: 1   IMVEXXY MAINTENANCE PACK 4 MCG INST, SMARTSIG:1 Vaginal Twice a Week, Disp: , Rfl:    levothyroxine (SYNTHROID) 75 MCG tablet, Take 1 tablet (75 mcg total) by mouth daily before breakfast., Disp: 90 tablet, Rfl: 3   Melatonin 5 MG CAPS, Take 1 tablet by mouth at bedtime., Disp: , Rfl:    meloxicam (MOBIC) 15 MG tablet, TAKE 1 TABLET BY MOUTH EVERY DAY AS NEEDED FOR PAIN, Disp: 90 tablet, Rfl: 3   montelukast (SINGULAIR) 10 MG tablet, Take 1 tablet (10 mg total) by mouth at bedtime., Disp: 90 tablet, Rfl: 1   nirmatrelvir/ritonavir (PAXLOVID) 20 x 150 MG & 10 x 100MG TABS, Take 3 tablets by mouth 2 (two) times daily for 5 days. (Take nirmatrelvir 150 mg two tablets twice daily for 5 days and ritonavir 100 mg one tablet twice daily for 5 days) Patient GFR is 76, Disp: 30 tablet, Rfl: 0   Omega-3 Fatty Acids (FISH OIL) 1000 MG CAPS, Take by mouth., Disp: , Rfl:    omeprazole (PRILOSEC) 20 MG capsule, Take 20 mg by mouth daily., Disp: , Rfl:    polyethylene glycol  (MIRALAX) 0.34 gm/ml SOLN, , Disp: , Rfl:    Propylene Glycol (SYSTANE BALANCE OP), Apply to eye., Disp: , Rfl:    SODIUM FLUORIDE 5000 PPM 1.1 % PSTE, See admin instructions., Disp: , Rfl:    zoledronic acid (RECLAST) 5 MG/100ML SOLN injection, Inject 5 mg into the vein once. Yearly infusion, Disp: , Rfl:    zolpidem (AMBIEN) 5 MG tablet, TAKE 0.5-1 TABLETS (2.5-5 MG TOTAL) BY MOUTH NIGHTLY AS NEEDED FOR SLEEP., Disp: 30 tablet, Rfl: 1 Allergies  Allergen Reactions   Clindamycin/Lincomycin Nausea Only   ROS: See pertinent positives and negatives per HPI.  OBSERVATIONS/OBJECTIVE:  VITALS per patient if applicable: Today's Vitals   04/18/22 0815  Weight: 115 lb (52.2 kg)  Height: 5'  3" (1.6 m)   Body mass index is 20.37 kg/m.  GENERAL: Alert and oriented. Appears well and in no acute distress. HEENT: Atraumatic. Eyes clear. No obvious abnormalities on inspection of external nose and ears. NECK: Normal movements of the head and neck. LUNGS: On inspection, no signs of respiratory distress. Breathing rate appears normal. No obvious gross SOB, gasping or wheezing, and no conversational dyspnea. CV: No obvious cyanosis. MS: Moves all visible extremities without noticeable abnormality. PSYCH/NEURO: Pleasant and cooperative. No obvious depression or anxiety. Speech and thought processing grossly intact.  ASSESSMENT AND PLAN:  Problem List Items Addressed This Visit       Respiratory   Chronic seasonal allergic rhinitis due to pollen    I will renew her budesonide and see if insurance will cover the cost of this.      Relevant Medications   budesonide ([employer] BUDESONIDE) 32 MCG/ACT nasal spray     Other   COVID-19 - Primary    Reviewed home care instructions for COVID including rest, pushing fluids, and OTC medications as needed for symptom relief. Recommend hot tea with honey for sore throat symptoms. I will prescribe Paxlovid. Advised self-isolation at home for at least 5 days.  After 5 days, if improved and fever resolved, can be in public, but should wear a mask around others for an additional 5 days. If symptoms, esp, dyspnea develops/worsens, recommend in-person evaluation at either an urgent care or the emergency room.       Relevant Medications   nirmatrelvir/ritonavir (PAXLOVID) 20 x 150 MG & 10 x 100MG TABS     I discussed the assessment and treatment plan with the patient. The patient was provided an opportunity to ask questions and all were answered. The patient agreed with the plan and demonstrated an understanding of the instructions.   The patient was advised to call back or seek an in-person evaluation if the symptoms worsen or if the condition fails to improve as anticipated.  Return if symptoms worsen or fail to improve.   Haydee Salter, MD

## 2022-04-30 ENCOUNTER — Other Ambulatory Visit: Payer: Self-pay | Admitting: Family Medicine

## 2022-04-30 DIAGNOSIS — F5101 Primary insomnia: Secondary | ICD-10-CM

## 2022-05-01 NOTE — Telephone Encounter (Signed)
Upcoming appointment on 05/02/22,  LR- 08/26/21, #30, 1 rf  Please review and advise. Thanks. Dm/cma

## 2022-05-02 ENCOUNTER — Ambulatory Visit (INDEPENDENT_AMBULATORY_CARE_PROVIDER_SITE_OTHER): Payer: Medicare Other | Admitting: Family Medicine

## 2022-05-02 VITALS — BP 110/64 | HR 45 | Temp 97.9°F | Ht 63.0 in | Wt 114.6 lb

## 2022-05-02 DIAGNOSIS — I10 Essential (primary) hypertension: Secondary | ICD-10-CM

## 2022-05-02 DIAGNOSIS — E039 Hypothyroidism, unspecified: Secondary | ICD-10-CM

## 2022-05-02 LAB — BASIC METABOLIC PANEL
BUN: 20 mg/dL (ref 6–23)
CO2: 26 mEq/L (ref 19–32)
Calcium: 9.6 mg/dL (ref 8.4–10.5)
Chloride: 99 mEq/L (ref 96–112)
Creatinine, Ser: 0.83 mg/dL (ref 0.40–1.20)
GFR: 71.78 mL/min (ref >60.00)
Glucose, Bld: 81 mg/dL (ref 70–99)
Potassium: 4.4 mEq/L (ref 3.5–5.1)
Sodium: 134 mEq/L — ABNORMAL LOW (ref 135–145)

## 2022-05-02 NOTE — Assessment & Plan Note (Signed)
Stable. Continue levothyroxine 75 mcg daily.

## 2022-05-02 NOTE — Progress Notes (Signed)
[facility] [address] [place] [address] Dept: [phone] Dept Fax: [phone]  Chronic Care Office Visit  Subjective:    Patient ID: Kathleen [name], female    DOB: 1950/05/13, 72 y.o..   MRN: [id number]  Chief Complaint  Patient presents with   Medical Management of Chronic Issues    3 month f/u.  No concerns.     History of Present Illness:  Patient is in today for reassessment of chronic medical issues.  Kathleen [name] has a history of  hypertension.  She was previously on HCTZ, but developed issues with hyponatremia. She is now managed on amlodipine 5 mg daily. Her hyponatremia has improved.   Kathleen [name] has a history of hypothyroidism. She is managed on levothyroxine 75 mcg daily.  Kathleen [name] recently had COVID. This is now resolved and she is feeling better.  Past Medical History: Patient Active Problem List   Diagnosis Date Noted   Family history of serrated polyposis syndrome (SPS)- Sister 11/01/2021   Dry eye syndrome of both eyes 10/18/2021   Sjogren's syndrome ([place]) 04/07/2021   Osteoarthritis of hands, bilateral 04/07/2021   Essential hypertension 02/27/2021   Chronic right sacroiliac pain 09/09/2020   Osteoporosis 11/29/2018   Piriformis syndrome of right side 10/23/2017   Spondylosis without myelopathy or radiculopathy, 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   Chronic myofascial pain 04/02/2017   ANA positive 01/25/2017   High risk medication use 05/30/2016   Xerostomia 05/03/2015   GERD (gastroesophageal reflux disease) 04/01/2015   IBS (irritable bowel syndrome) 04/01/2015   Insomnia 04/01/2015   Chronic seasonal allergic rhinitis due to pollen 02/04/2015   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   Tinnitus 02/04/2015   Multifactorial low back pain 12/15/2014   Personal history of other malignant neoplasm of skin 12/08/2011   Chronic back pain 08/24/2011   Hypothyroidism 02/28/1995    Past Surgical History:  Procedure Laterality Date   CATARACT EXTRACTION, BILATERAL     KNEE ARTHROSCOPY W/ MENISCECTOMY Left    Family History  Problem Relation Age of Onset   Stroke Mother    Heart disease Mother    Osteoarthritis Mother    Heart disease Father    Hypertension Father    Heart disease Paternal Uncle    Cancer Paternal Uncle        Lung   Outpatient Medications Prior to Visit  Medication Sig Dispense Refill   acetaminophen (TYLENOL) 325 MG tablet Take 650 mg by mouth every 6 (six) hours as needed.     Alpha-Lipoic Acid 300 MG CAPS      amLODipine (NORVASC) 5 MG tablet TAKE 1 TABLET (5 MG TOTAL) BY MOUTH DAILY. 90 tablet 1   Ascorbic Acid (VITAMIN C) 100 MG tablet Take 100 mg by mouth daily.     azelastine (ASTELIN) 0.1 % nasal spray Place 1 spray into both nostrils 2 (two) times daily.     budesonide ([employer] BUDESONIDE) 32 MCG/ACT nasal spray Place 1 spray into both nostrils daily. 8.43 mL 5   Calcium Carbonate-Vit D-Min (CALTRATE 600+D PLUS MINERALS PO) Take 1 tablet by mouth daily.     cevimeline (EVOXAC) 30 MG capsule Take 1 capsule (30 mg total) by mouth 3 (three) times daily. 270 capsule 1   cycloSPORINE (RESTASIS) 0.05 % ophthalmic emulsion Place 1 drop into both eyes 2 (two) times daily. 90 each 0   diazepam (VALIUM) 5 MG tablet Take 1 tablet (5 mg total) by mouth every 6 (  six) hours as needed for anxiety. 2 tablet 0   diclofenac sodium (VOLTAREN) 1 % GEL APPLY EXTERNALLY TO THE AFFECTED AREA FOUR TIMES DAILY 300 g 2   DULoxetine (CYMBALTA) 30 MG capsule TAKE 1 CAPSULE BY MOUTH EVERY DAY 90 capsule 3   fexofenadine (ALLEGRA) 180 MG tablet Take 180 mg by mouth daily.     gabapentin (NEURONTIN) 100 MG capsule TAKE 2 CAPSULES (200 MG TOTAL) BY MOUTH 2 TIMES DAILY. 360 capsule 2   gabapentin (NEURONTIN) 600 MG tablet TAKE 1 TABLET BY MOUTH EVERY DAY 90 tablet 3   hydroxychloroquine (PLAQUENIL) 200 MG tablet Take 1 tablet (200 mg total) by mouth daily. 90 tablet 1    IMVEXXY MAINTENANCE PACK 4 MCG INST SMARTSIG:1 Vaginal Twice a Week     levothyroxine (SYNTHROID) 75 MCG tablet Take 1 tablet (75 mcg total) by mouth daily before breakfast. 90 tablet 3   Melatonin 5 MG CAPS Take 1 tablet by mouth at bedtime.     meloxicam (MOBIC) 15 MG tablet TAKE 1 TABLET BY MOUTH EVERY DAY AS NEEDED FOR PAIN 90 tablet 3   montelukast (SINGULAIR) 10 MG tablet Take 1 tablet (10 mg total) by mouth at bedtime. 90 tablet 1   Omega-3 Fatty Acids (FISH OIL) 1000 MG CAPS Take by mouth.     omeprazole (PRILOSEC) 20 MG capsule Take 20 mg by mouth daily.     polyethylene glycol (MIRALAX) 0.34 gm/ml SOLN      Propylene Glycol (SYSTANE BALANCE OP) Apply to eye.     SODIUM FLUORIDE 5000 PPM 1.1 % PSTE See admin instructions.     zoledronic acid (RECLAST) 5 MG/100ML SOLN injection Inject 5 mg into the vein once. Yearly infusion     zolpidem (AMBIEN) 5 MG tablet TAKE 1/2 TO 1 TABLET BY MOUTH NIGHTLY AS NEEDED FOR SLEEP 15 tablet 2   No facility-administered medications prior to visit.   Allergies  Allergen Reactions   Clindamycin/Lincomycin Nausea Only   Objective:   Today's Vitals   05/02/22 0948  BP: 110/64  Pulse: (!) 45  Temp: 97.9 F (36.6 C)  TempSrc: Temporal  SpO2: 98%  Weight: 114 lb 9.6 oz (52 kg)  Height: '5\' 3"'$  (1.6 m)   Body mass index is 20.3 kg/m.   General: Well developed, well nourished. No acute distress. Psych: Alert and oriented. Normal mood and affect.  There are no preventive care reminders to display for this patient.    Assessment & Plan:   Problem List Items Addressed This Visit       Cardiovascular and Mediastinum   Essential hypertension - Primary    Blood pressure is in good control. Continue amlodipine 5 mg daily. I will reassess her sodium level to make sure this is maintain normal at this point.      Relevant Orders   Basic metabolic panel     Endocrine   Hypothyroidism    Stable. Continue levothyroxine 75 mcg daily.        Return in about 3 months (around 08/02/2022) for Reassessment.   Haydee Salter, MD

## 2022-05-02 NOTE — Assessment & Plan Note (Addendum)
Blood pressure is in good control. Continue amlodipine 5 mg daily. I will reassess her sodium level to make sure this is maintain normal at this point.

## 2022-05-05 ENCOUNTER — Ambulatory Visit: Payer: Medicare Other | Admitting: Physical Medicine & Rehabilitation

## 2022-05-16 ENCOUNTER — Encounter: Payer: Self-pay | Admitting: Physical Medicine & Rehabilitation

## 2022-05-16 ENCOUNTER — Encounter: Payer: Medicare Other | Attending: Physical Medicine & Rehabilitation | Admitting: Physical Medicine & Rehabilitation

## 2022-05-16 VITALS — Ht 63.0 in | Wt 112.0 lb

## 2022-05-16 DIAGNOSIS — M47816 Spondylosis without myelopathy or radiculopathy, lumbar region: Secondary | ICD-10-CM | POA: Insufficient documentation

## 2022-05-16 MED ORDER — LIDOCAINE HCL 1 % IJ SOLN: 10.0000 mL | Freq: Once | INTRAMUSCULAR | Status: AC

## 2022-05-16 MED ORDER — LIDOCAINE HCL (PF) 2 % IJ SOLN: 5.0000 mL | Freq: Once | INTRAMUSCULAR | Status: AC

## 2022-05-16 NOTE — Progress Notes (Signed)

## 2022-05-16 NOTE — Patient Instructions (Signed)
You had a radio frequency procedure today This was done to alleviate joint pain in your lumbar area We injected lidocaine which is a local anesthetic.  You may experience soreness at the injection sites. You may also experienced some irritation of the nerves that were heated I'm recommending ice for 30 minutes every 2 hours as needed for the next 24-48 hours   

## 2022-05-16 NOTE — Progress Notes (Signed)
  PROCEDURE RECORD [facility]   Name: Kathleen [name] DOB:05-Jan-1951 MRN: [id number]  Date:05/16/2022  Physician: Alysia Penna, MD    Nurse/CMA: Truman Hayward, CMA  Allergies:  Allergies  Allergen Reactions   Clindamycin/Lincomycin Nausea Only    Consent Signed: Yes.    Is patient diabetic? No.  CBG today? N/a  Pregnant: No. LMP: No LMP recorded. Patient is postmenopausal. (age 70-55)  Anticoagulants: no Anti-inflammatory: meloxicam Antibiotics: no  Procedure: Right L3-4-5 Radiofrequency  Position: Prone Start Time: 11:03 am  End Time: 11:22 am  Fluoro Time: 36  RN/CMA Vikki Ports, CMA    Time 10:29 11:31 am    BP 128/76 146/74    Pulse 69 59    Respirations 16 16    O2 Sat 98 97    S/S 6 6    Pain Level 7/10 0/10     D/C home with husband, patient A & O X 3, D/C instructions reviewed, and sits independently.

## 2022-05-21 ENCOUNTER — Other Ambulatory Visit: Payer: Self-pay | Admitting: Internal Medicine

## 2022-05-21 DIAGNOSIS — M3500 Sicca syndrome, unspecified: Secondary | ICD-10-CM

## 2022-05-23 ENCOUNTER — Encounter: Payer: Self-pay | Admitting: *Deleted

## 2022-05-23 NOTE — Telephone Encounter (Signed)
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   Okay to refill Plaquenil?

## 2022-06-06 ENCOUNTER — Ambulatory Visit: Payer: Medicare Other | Admitting: Physical Medicine & Rehabilitation

## 2022-06-07 ENCOUNTER — Other Ambulatory Visit: Payer: Self-pay | Admitting: Internal Medicine

## 2022-06-07 DIAGNOSIS — M3500 Sicca syndrome, unspecified: Secondary | ICD-10-CM

## 2022-06-07 NOTE — Telephone Encounter (Signed)
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   Okay to refill Evoxac?

## 2022-06-12 ENCOUNTER — Encounter: Payer: Self-pay | Admitting: *Deleted

## 2022-06-18 ENCOUNTER — Encounter: Payer: Self-pay | Admitting: Physical Medicine & Rehabilitation

## 2022-06-22 ENCOUNTER — Encounter: Payer: Medicare Other | Attending: Physical Medicine & Rehabilitation | Admitting: Physical Medicine & Rehabilitation

## 2022-06-22 ENCOUNTER — Encounter: Payer: Self-pay | Admitting: Physical Medicine & Rehabilitation

## 2022-06-22 DIAGNOSIS — M47816 Spondylosis without myelopathy or radiculopathy, lumbar region: Secondary | ICD-10-CM | POA: Insufficient documentation

## 2022-06-22 MED ORDER — LIDOCAINE HCL (PF) 2 % IJ SOLN
5.0000 mL | Freq: Once | INTRAMUSCULAR | Status: AC
  Administered 2022-06-22: 5 mL

## 2022-06-22 MED ORDER — LIDOCAINE HCL 1 % IJ SOLN
10.0000 mL | Freq: Once | INTRAMUSCULAR | Status: AC
  Administered 2022-06-22: 10 mL

## 2022-06-22 NOTE — Progress Notes (Signed)
  PROCEDURE RECORD [facility]   Name: Kathleen [name] DOB:06-28-1950 MRN: [id number]  Date:06/22/2022  Physician: Claudette Laws, MD    Nurse/CMA: Lilie Vezina S  Allergies:  Allergies  Allergen Reactions   Clindamycin/Lincomycin Nausea Only    Consent Signed: Yes.    Is patient diabetic? No.  CBG today? N/a  Pregnant: No. LMP: No LMP recorded. Patient is postmenopausal. (age 70-55)  Anticoagulants: no Anti-inflammatory: yes (Meloxicam) Antibiotics: no  Procedure: Right L5 Dorsal Ramus, L4-L3 Sacroiliac RF  Position: Prone Start Time: 11:22 AM  End Time: 11:36  Fluoro Time: 41  RN/CMA Advay Volante S Royal RMA    Time 10:57 11:43    BP 127/73 125/77    Pulse 65 66    Respirations 16 16    O2 Sat 98 99    S/S 6 6    Pain Level 5/10 0/10     D/C home with spouse, patient A & O X 3, D/C instructions reviewed, and sits independently.

## 2022-06-22 NOTE — Patient Instructions (Signed)
You had a radio frequency procedure today °This was done to alleviate joint pain in your lumbosacral area °We injected lidocaine which is a local anesthetic. ° °You may experience soreness at the injection sites. °You may also experienced some irritation of the nerves that were heated °I'm recommending ice for 30 minutes every 2 hours as needed for the next 24-48 hours ° ° °

## 2022-06-22 NOTE — Progress Notes (Signed)
RIGHT Sacroiliac radio frequency Neurotomy  under fluoroscopic guidance  S1,2,3 lateral branch radiofrequency Neurotomy    Indication is sacroiliac pain which has improved temporarily  by at least 50% after Intraarticular sacroiliac and/or L5 DR, S1.2.3 lateral branch blocks under fluoroscopic guidance Pain interferes with self-care and mobility and has failed to respond to conservative measures.  Informed consent was obtained after discussing risks and benefits of the procedure with the patient these include bleeding bruising and infection temporary or permanent paralysis. The patient elects to proceed and has given written consent.  Patient placed prone on fluoroscopy table. Area marked and prepped with Betadine. Fluoroscopic images utilized to guide needle. 25-gauge 1.5 inch needle was used to anesthetize 5 injection points with 2 cc of 1% lidocaine each. Then a 18-gauge 10 cm RF needle with a 10 mm curved active tip was inserted under fluoroscopic guidance targeting the infero- lateral aspect of the S1, S2 and lateral aspect of S3 sacral foramina were targeted. Bone contact made.  Motor stim at 2 Hz confirmed proper needle location. One ML of the 2% lidocaine solution was injected into each of 3 sites and radio frequency ablation 80C for 90 seconds was performed. Patient tolerated procedure well. Post procedure instructions given

## 2022-07-03 ENCOUNTER — Ambulatory Visit (INDEPENDENT_AMBULATORY_CARE_PROVIDER_SITE_OTHER): Payer: Medicare Other | Admitting: Nurse Practitioner

## 2022-07-03 ENCOUNTER — Encounter: Payer: Self-pay | Admitting: Nurse Practitioner

## 2022-07-03 VITALS — BP 122/76 | HR 58 | Temp 98.1°F | Ht 63.0 in | Wt 113.0 lb

## 2022-07-03 DIAGNOSIS — R2232 Localized swelling, mass and lump, left upper limb: Secondary | ICD-10-CM

## 2022-07-03 DIAGNOSIS — M67442 Ganglion, left hand: Secondary | ICD-10-CM | POA: Insufficient documentation

## 2022-07-03 NOTE — Progress Notes (Signed)
   Acute Office Visit  Subjective:     Patient ID: Kathleen [name], female    DOB: March 29, 1950, 72 y.o.   MRN: [id number]  Chief Complaint  Patient presents with   Hand Pain    Left hand-3rd finger bumps with pain with numbness    HPI Patient is in today for a bump on her left 3rd finger for the last 5 days.  She states that it is painful to touch.  She has a history of arthritis, however those deformities and nodules tend to be on the posterior surfaces on of her hand and this is on the anterior surface.  She states that it has not gotten larger or smaller over the last few days.  She takes Tylenol for her arthritis and has not noticed too much of a difference in the pain.  She does have a history of ganglion cyst and is wondering if this could be happening here.  She denies fevers, drainage.  She does notice a numbness sensation around the tip of her finger when she touches this area.  ROS See pertinent positives and negatives per HPI.     Objective:    BP 122/76 (BP Location: Left Arm)   Pulse (!) 58   Temp 98.1 F (36.7 C)   Ht 5\' 3"  (1.6 m)   Wt 113 lb (51.3 kg)   SpO2 99%   BMI 20.02 kg/m    Physical Exam Vitals and nursing note reviewed.  Constitutional:      General: She is not in acute distress.    Appearance: Normal appearance.  HENT:     Head: Normocephalic.  Eyes:     Conjunctiva/sclera: Conjunctivae normal.  Pulmonary:     Effort: Pulmonary effort is normal.  Musculoskeletal:        General: Swelling and tenderness present.     Cervical back: Normal range of motion.     Comments: Painful 0.5cm nodule under skin to left anterior DIP joint  Skin:    General: Skin is warm.  Neurological:     General: No focal deficit present.     Mental Status: She is alert and oriented to person, place, and time.  Psychiatric:        Mood and Affect: Mood normal.        Behavior: Behavior normal.        Thought Content: Thought content normal.        Judgment:  Judgment normal.       Assessment & Plan:   Problem List Items Addressed This Visit       Other   Nodule of finger of left hand - Primary    New nodule under skin of left DIP joint on anterior surface of finger.  This is tender to touch.  May be consistent with ganglion cyst.  She can use ice/heat, continue Tylenol.  She can also try wearing a splint to her finger.  She is going to call her orthopedic to schedule an appointment.  She is also planning on going out of town on Wednesday, discussed that she can go to the [facility] walk-in clinic if she would like to be seen sooner.  Follow-up if symptoms worsen or with any concerns.       No orders of the defined types were placed in this encounter.   Return if symptoms worsen or fail to improve.  Gerre Scull, NP

## 2022-07-03 NOTE — Patient Instructions (Signed)
It was great to see you!  Start using ice/heat to the area several times a day.   Keep taking tylenol  You can go to the [facility] walk in clinic at [facility].   Let's follow-up if your symptoms worsen or don't improve.   Take care,  Rodman Pickle, NP

## 2022-07-03 NOTE — Assessment & Plan Note (Signed)
New nodule under skin of left DIP joint on anterior surface of finger.  This is tender to touch.  May be consistent with ganglion cyst.  She can use ice/heat, continue Tylenol.  She can also try wearing a splint to her finger.  She is going to call her orthopedic to schedule an appointment.  She is also planning on going out of town on Wednesday, discussed that she can go to the [facility] walk-in clinic if she would like to be seen sooner.  Follow-up if symptoms worsen or with any concerns.

## 2022-07-29 ENCOUNTER — Other Ambulatory Visit: Payer: Self-pay | Admitting: Family Medicine

## 2022-07-29 DIAGNOSIS — M47816 Spondylosis without myelopathy or radiculopathy, lumbar region: Secondary | ICD-10-CM

## 2022-08-02 ENCOUNTER — Ambulatory Visit (INDEPENDENT_AMBULATORY_CARE_PROVIDER_SITE_OTHER): Payer: Medicare Other | Admitting: Family Medicine

## 2022-08-02 ENCOUNTER — Encounter: Payer: Self-pay | Admitting: Family Medicine

## 2022-08-02 VITALS — BP 116/62 | HR 55 | Temp 98.2°F | Ht 63.0 in | Wt 114.2 lb

## 2022-08-02 DIAGNOSIS — M67442 Ganglion, left hand: Secondary | ICD-10-CM

## 2022-08-02 DIAGNOSIS — M47816 Spondylosis without myelopathy or radiculopathy, lumbar region: Secondary | ICD-10-CM

## 2022-08-02 DIAGNOSIS — I10 Essential (primary) hypertension: Secondary | ICD-10-CM

## 2022-08-02 DIAGNOSIS — J301 Allergic rhinitis due to pollen: Secondary | ICD-10-CM

## 2022-08-02 DIAGNOSIS — F5101 Primary insomnia: Secondary | ICD-10-CM

## 2022-08-02 MED ORDER — DULOXETINE HCL 30 MG PO CPEP: ORAL_CAPSULE | ORAL | 3 refills | Status: DC

## 2022-08-02 MED ORDER — MONTELUKAST SODIUM 10 MG PO TABS: 10.0000 mg | ORAL_TABLET | Freq: Every day | ORAL | 1 refills | Status: DC

## 2022-08-02 MED ORDER — ZOLPIDEM TARTRATE 5 MG PO TABS: ORAL_TABLET | ORAL | 2 refills | Status: DC

## 2022-08-02 NOTE — Progress Notes (Signed)
[facility] [address] [place] [address] Dept: [phone] Dept Fax: [phone]  Chronic Care Office Visit  Subjective:    Patient ID: Kathleen [name], female    DOB: 04-10-1950, 72 y.o..   MRN: [id number]  Chief Complaint  Patient presents with   Medical Management of Chronic Issues    3 month f/u.  No concerns.     History of Present Illness:  Patient is in today for reassessment of chronic medical issues.  Ms. Fittro has a history of hypertension.  She was previously on HCTZ, but developed issues with hyponatremia. She is now managed on amlodipine 5 mg daily. Her hyponatremia has improved, thoguh it was slightly low at her last visit.  Ms. Foot was seen about a month ago with a cyst in her distal left 3rd finger. She was referred to Dr. Amanda Pea. He aspirated a ganglion cyst. Although this has remain resolved. She now fele another mildly tender mass on the other side of that same finger pad.  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   Essential hypertension 02/27/2021   Chronic right sacroiliac pain 09/09/2020   Osteoporosis 11/29/2018   Piriformis syndrome of right side 10/23/2017   Spondylosis without myelopathy or radiculopathy, 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   Chronic myofascial pain 04/02/2017   ANA positive 01/25/2017   High risk medication use 05/30/2016   Xerostomia 05/03/2015   GERD (gastroesophageal reflux disease) 04/01/2015   IBS (irritable bowel syndrome) 04/01/2015   Insomnia 04/01/2015   Chronic seasonal allergic rhinitis due to pollen 02/04/2015   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   Tinnitus  02/04/2015   Multifactorial low back pain 12/15/2014   Personal history of other malignant neoplasm of skin 12/08/2011   Chronic back pain 08/24/2011   Hypothyroidism 02/28/1995   Past Surgical History:  Procedure Laterality Date   CATARACT EXTRACTION, BILATERAL     KNEE ARTHROSCOPY W/ MENISCECTOMY Left    Family History  Problem Relation Age of Onset   Stroke Mother    Heart disease Mother    Osteoarthritis Mother    Heart disease Father    Hypertension Father    Heart disease Paternal Uncle    Cancer Paternal Uncle        Lung   Outpatient Medications Prior to Visit  Medication Sig Dispense Refill   acetaminophen (TYLENOL) 325 MG tablet Take 650 mg by mouth every 6 (six) hours as needed.     Alpha-Lipoic Acid 300 MG CAPS      amLODipine (NORVASC) 5 MG tablet TAKE 1 TABLET (5 MG TOTAL) BY MOUTH DAILY. 90 tablet 1   Ascorbic Acid (VITAMIN C) 100 MG tablet Take 100 mg by mouth daily.     azelastine (ASTELIN) 0.1 % nasal spray Place 1 spray into both nostrils 2 (two) times daily.     budesonide ([employer] BUDESONIDE) 32 MCG/ACT nasal spray Place 1 spray into both nostrils daily. 8.43 mL 5   Calcium Carbonate-Vit D-Min (CALTRATE 600+D PLUS MINERALS PO) Take 1 tablet by mouth daily.     cevimeline (EVOXAC) 30 MG capsule TAKE 1 CAPSULE BY MOUTH 3 TIMES DAILY. 270 capsule 1   cycloSPORINE (RESTASIS) 0.05 %  ophthalmic emulsion Place 1 drop into both eyes 2 (two) times daily. 90 each 0   diazepam (VALIUM) 5 MG tablet Take 1 tablet (5 mg total) by mouth every 6 (six) hours as needed for anxiety. 2 tablet 0   diclofenac sodium (VOLTAREN) 1 % GEL APPLY EXTERNALLY TO THE AFFECTED AREA FOUR TIMES DAILY 300 g 2   fexofenadine (ALLEGRA) 180 MG tablet Take 180 mg by mouth daily.     gabapentin (NEURONTIN) 100 MG capsule TAKE 2 CAPSULES (200 MG TOTAL) BY MOUTH 2 TIMES DAILY. 360 capsule 0   gabapentin (NEURONTIN) 600 MG tablet TAKE 1 TABLET BY MOUTH EVERY DAY 90 tablet 3   hydroxychloroquine  (PLAQUENIL) 200 MG tablet TAKE 1 TABLET BY MOUTH EVERY DAY 90 tablet 0   IMVEXXY MAINTENANCE PACK 4 MCG INST SMARTSIG:1 Vaginal Twice a Week     levothyroxine (SYNTHROID) 75 MCG tablet Take 1 tablet (75 mcg total) by mouth daily before breakfast. 90 tablet 3   Melatonin 5 MG CAPS Take 1 tablet by mouth at bedtime.     meloxicam (MOBIC) 15 MG tablet TAKE 1 TABLET BY MOUTH EVERY DAY AS NEEDED FOR PAIN 90 tablet 3   Omega-3 Fatty Acids (FISH OIL) 1000 MG CAPS Take by mouth.     omeprazole (PRILOSEC) 20 MG capsule Take 20 mg by mouth daily.     polyethylene glycol (MIRALAX) 0.34 gm/ml SOLN      Propylene Glycol (SYSTANE BALANCE OP) Apply to eye.     SODIUM FLUORIDE 5000 PPM 1.1 % PSTE See admin instructions.     zoledronic acid (RECLAST) 5 MG/100ML SOLN injection Inject 5 mg into the vein once. Yearly infusion     DULoxetine (CYMBALTA) 30 MG capsule TAKE 1 CAPSULE BY MOUTH EVERY DAY 90 capsule 3   montelukast (SINGULAIR) 10 MG tablet Take 1 tablet (10 mg total) by mouth at bedtime. 90 tablet 1   zolpidem (AMBIEN) 5 MG tablet TAKE 1/2 TO 1 TABLET BY MOUTH NIGHTLY AS NEEDED FOR SLEEP 15 tablet 2   Facility-Administered Medications Prior to Visit  Medication Dose Route Frequency Provider Last Rate Last Admin   lidocaine (XYLOCAINE) 1 % (with pres) injection 10 mL  10 mL Other Once Kirsteins, Victorino Sparrow, MD       lidocaine HCl (PF) (XYLOCAINE) 2 % injection 5 mL  5 mL Other Once Kirsteins, Victorino Sparrow, MD       Allergies  Allergen Reactions   Clindamycin/Lincomycin Nausea Only   Objective:   Today's Vitals   08/02/22 1254  BP: 116/62  Pulse: (!) 55  Temp: 98.2 F (36.8 C)  TempSrc: Temporal  SpO2: 99%  Weight: 114 lb 3.2 oz (51.8 kg)  Height: 5\' 3"  (1.6 m)   Body mass index is 20.23 kg/m.   General: Well developed, well nourished. No acute distress. Extremities: There are nodular changes to several of the DIP/PIP joints. There eis a deep cystic mass   palpable within the pad of the left  3rd finger. Skin: Warm and dry. No rashes. Neuro: CN II-XII intact. Normal sensation and DTR bilaterally. Psych: Alert and oriented. Normal mood and affect.  Health Maintenance Due  Topic Date Due   Medicare Annual Wellness (AWV)  09/01/2022     Assessment & Plan:   Problem List Items Addressed This Visit       Cardiovascular and Mediastinum   Essential hypertension - Primary    Blood pressure is in good control. Continue amlodipine 5 mg. I  will recheck her BMP today to make sure her hyponatremia has not recurred.       Relevant Orders   Basic metabolic panel     Respiratory   Chronic seasonal allergic rhinitis due to pollen    Stable. Continue montelukast 10 mg daily.      Relevant Medications   montelukast (SINGULAIR) 10 MG tablet     Musculoskeletal and Integument   Multifactorial low back pain    Recent radioablation that has been variably effective.      Relevant Medications   DULoxetine (CYMBALTA) 30 MG capsule     Other   Insomnia    Stable. Continue Ambien.      Relevant Medications   zolpidem (AMBIEN) 5 MG tablet   Ganglion cyst of finger of left hand    This feels like another ganglion cyst. I recommend she follow up with Dr. Amanda Pea to consider having this one aspirated as well.       Return in about 6 months (around 02/01/2023) for Reassessment.   Loyola Mast, MD

## 2022-08-02 NOTE — Assessment & Plan Note (Signed)
Recent radioablation that has been variably effective.

## 2022-08-02 NOTE — Assessment & Plan Note (Signed)
This feels like another ganglion cyst. I recommend she follow up with Dr. Amanda Pea to consider having this one aspirated as well.

## 2022-08-02 NOTE — Assessment & Plan Note (Signed)
Blood pressure is in good control. Continue amlodipine 5 mg. I will recheck her BMP today to make sure her hyponatremia has not recurred.

## 2022-08-02 NOTE — Assessment & Plan Note (Signed)
Stable.  Continue montelukast 10mg daily

## 2022-08-02 NOTE — Assessment & Plan Note (Signed)
Stable.  Continue Ambien. 

## 2022-08-03 LAB — BASIC METABOLIC PANEL
BUN: 23 mg/dL (ref 6–23)
CO2: 22 mEq/L (ref 19–32)
Calcium: 9.2 mg/dL (ref 8.4–10.5)
Chloride: 98 mEq/L (ref 96–112)
Creatinine, Ser: 0.84 mg/dL (ref 0.40–1.20)
GFR: 70.63 mL/min (ref >60.00)
Glucose, Bld: 102 mg/dL — ABNORMAL HIGH (ref 70–99)
Potassium: 4.1 mEq/L (ref 3.5–5.1)
Sodium: 133 mEq/L — ABNORMAL LOW (ref 135–145)

## 2022-08-10 ENCOUNTER — Encounter: Payer: Self-pay | Admitting: Family Medicine

## 2022-08-20 ENCOUNTER — Other Ambulatory Visit: Payer: Self-pay | Admitting: Internal Medicine

## 2022-08-20 DIAGNOSIS — M3500 Sicca syndrome, unspecified: Secondary | ICD-10-CM

## 2022-08-21 NOTE — Telephone Encounter (Signed)
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

## 2022-09-18 ENCOUNTER — Ambulatory Visit (INDEPENDENT_AMBULATORY_CARE_PROVIDER_SITE_OTHER): Payer: Medicare Other

## 2022-09-18 VITALS — Ht 63.0 in | Wt 115.0 lb

## 2022-09-18 DIAGNOSIS — Z0000 Encounter for general adult medical examination without abnormal findings: Secondary | ICD-10-CM

## 2022-09-18 NOTE — Patient Instructions (Signed)
Kathleen [name] , Thank you for taking time to come for your Medicare Wellness Visit. I appreciate your ongoing commitment to your health goals. Please review the following plan we discussed and let me know if I can assist you in the future.   These are the goals we discussed:  Goals      Patient Stated     09/18/2022, keep moving        This is a list of the screening recommended for you and due dates:  Health Maintenance  Topic Date Due   COVID-19 Vaccine (7 - 2023-24 season) 01/31/2022   Mammogram  09/22/2022   Flu Shot  09/28/2022   Medicare Annual Wellness Visit  09/18/2023   Colon Cancer Screening  09/25/2025   DTaP/Tdap/Td vaccine (3 - Td or Tdap) 10/06/2028   Pneumonia Vaccine  Completed   DEXA scan (bone density measurement)  Completed   Hepatitis C Screening  Completed   Zoster (Shingles) Vaccine  Completed   HPV Vaccine  Aged Out    Advanced directives: Please bring a copy of your POA (Power of [place]) and/or Living Will to your next appointment.   Conditions/risks identified: none  Next appointment: Follow up in one year for your annual wellness visit    Preventive Care 65 Years and Older, Female Preventive care refers to lifestyle choices and visits with your health care provider that can promote health and wellness. What does preventive care include? A yearly physical exam. This is also called an annual well check. Dental exams once or twice a year. Routine eye exams. Ask your health care provider how often you should have your eyes checked. Personal lifestyle choices, including: Daily care of your teeth and gums. Regular physical activity. Eating a healthy diet. Avoiding tobacco and drug use. Limiting alcohol use. Practicing safe sex. Taking low-dose aspirin every day. Taking vitamin and mineral supplements as recommended by your health care provider. What happens during an annual well check? The services and screenings done by your health care  provider during your annual well check will depend on your age, overall health, lifestyle risk factors, and family history of disease. Counseling  Your health care provider may ask you questions about your: Alcohol use. Tobacco use. Drug use. Emotional well-being. Home and relationship well-being. Sexual activity. Eating habits. History of falls. Memory and ability to understand (cognition). Work and work Astronomer. Reproductive health. Screening  You may have the following tests or measurements: Height, weight, and BMI. Blood pressure. Lipid and cholesterol levels. These may be checked every 5 years, or more frequently if you are over 60 years old. Skin check. Lung cancer screening. You may have this screening every year starting at age 65 if you have a 30-pack-year history of smoking and currently smoke or have quit within the past 15 years. Fecal occult blood test (FOBT) of the stool. You may have this test every year starting at age 19. Flexible sigmoidoscopy or colonoscopy. You may have a sigmoidoscopy every 5 years or a colonoscopy every 10 years starting at age 79. Hepatitis C blood test. Hepatitis B blood test. Sexually transmitted disease (STD) testing. Diabetes screening. This is done by checking your blood sugar (glucose) after you have not eaten for a while (fasting). You may have this done every 1-3 years. Bone density scan. This is done to screen for osteoporosis. You may have this done starting at age 61. Mammogram. This may be done every 1-2 years. Talk to your health care provider about how  often you should have regular mammograms. Talk with your health care provider about your test results, treatment options, and if necessary, the need for more tests. Vaccines  Your health care provider may recommend certain vaccines, such as: Influenza vaccine. This is recommended every year. Tetanus, diphtheria, and acellular pertussis (Tdap, Td) vaccine. You may need a Td  booster every 10 years. Zoster vaccine. You may need this after age 81. Pneumococcal 13-valent conjugate (PCV13) vaccine. One dose is recommended after age 14. Pneumococcal polysaccharide (PPSV23) vaccine. One dose is recommended after age 78. Talk to your health care provider about which screenings and vaccines you need and how often you need them. This information is not intended to replace advice given to you by your health care provider. Make sure you discuss any questions you have with your health care provider. Document Released: 03/12/2015 Document Revised: 11/03/2015 Document Reviewed: 12/15/2014 Elsevier Interactive Patient Education  2017 [employer].  Fall Prevention in the Home Falls can cause injuries. They can happen to people of all ages. There are many things you can do to make your home safe and to help prevent falls. What can I do on the outside of my home? Regularly fix the edges of walkways and driveways and fix any cracks. Remove anything that might make you trip as you walk through a door, such as a raised step or threshold. Trim any bushes or trees on the path to your home. Use bright outdoor lighting. Clear any walking paths of anything that might make someone trip, such as rocks or tools. Regularly check to see if handrails are loose or broken. Make sure that both sides of any steps have handrails. Any raised decks and porches should have guardrails on the edges. Have any leaves, snow, or ice cleared regularly. Use sand or salt on walking paths during winter. Clean up any spills in your garage right away. This includes oil or grease spills. What can I do in the bathroom? Use night lights. Install grab bars by the toilet and in the tub and shower. Do not use towel bars as grab bars. Use non-skid mats or decals in the tub or shower. If you need to sit down in the shower, use a plastic, non-slip stool. Keep the floor dry. Clean up any water that spills on the floor  as soon as it happens. Remove soap buildup in the tub or shower regularly. Attach bath mats securely with double-sided non-slip rug tape. Do not have throw rugs and other things on the floor that can make you trip. What can I do in the bedroom? Use night lights. Make sure that you have a light by your bed that is easy to reach. Do not use any sheets or blankets that are too big for your bed. They should not hang down onto the floor. Have a firm chair that has side arms. You can use this for support while you get dressed. Do not have throw rugs and other things on the floor that can make you trip. What can I do in the kitchen? Clean up any spills right away. Avoid walking on wet floors. Keep items that you use a lot in easy-to-reach places. If you need to reach something above you, use a strong step stool that has a grab bar. Keep electrical cords out of the way. Do not use floor polish or wax that makes floors slippery. If you must use wax, use non-skid floor wax. Do not have throw rugs and other things  on the floor that can make you trip. What can I do with my stairs? Do not leave any items on the stairs. Make sure that there are handrails on both sides of the stairs and use them. Fix handrails that are broken or loose. Make sure that handrails are as long as the stairways. Check any carpeting to make sure that it is firmly attached to the stairs. Fix any carpet that is loose or worn. Avoid having throw rugs at the top or bottom of the stairs. If you do have throw rugs, attach them to the floor with carpet tape. Make sure that you have a light switch at the top of the stairs and the bottom of the stairs. If you do not have them, ask someone to add them for you. What else can I do to help prevent falls? Wear shoes that: Do not have high heels. Have rubber bottoms. Are comfortable and fit you well. Are closed at the toe. Do not wear sandals. If you use a stepladder: Make sure that it is  fully opened. Do not climb a closed stepladder. Make sure that both sides of the stepladder are locked into place. Ask someone to hold it for you, if possible. Clearly mark and make sure that you can see: Any grab bars or handrails. First and last steps. Where the edge of each step is. Use tools that help you move around (mobility aids) if they are needed. These include: Canes. Walkers. Scooters. Crutches. Turn on the lights when you go into a dark area. Replace any light bulbs as soon as they burn out. Set up your furniture so you have a clear path. Avoid moving your furniture around. If any of your floors are uneven, fix them. If there are any pets around you, be aware of where they are. Review your medicines with your doctor. Some medicines can make you feel dizzy. This can increase your chance of falling. Ask your doctor what other things that you can do to help prevent falls. This information is not intended to replace advice given to you by your health care provider. Make sure you discuss any questions you have with your health care provider. Document Released: 12/10/2008 Document Revised: 07/22/2015 Document Reviewed: 03/20/2014 Elsevier Interactive Patient Education  2017 [employer].

## 2022-09-18 NOTE — Progress Notes (Signed)
Subjective:   Kathleen [name] is a 72 y.o. female who presents for Medicare Annual (Subsequent) preventive examination.  Visit Complete: Virtual  I connected with  Darrel Hoover on 09/18/22 by a audio enabled telemedicine application and verified that I am speaking with the correct person using two identifiers.  Patient Location: Home  Provider Location: Office/Clinic  I discussed the limitations of evaluation and management by telemedicine. The patient expressed understanding and agreed to proceed.  Per patient no change in vitals since last visit, unable to obtain new vitals due to telehealth visit  Patient Medicare AWV questionnaire was completed by the patient on 09/14/2022; I have confirmed that all information answered by patient is correct and no changes since this date.  Review of Systems     Cardiac Risk Factors include: advanced age (>57men, >91 women);hypertension     Objective:    Today's Vitals   09/18/22 1355 09/18/22 1356  Weight: 115 lb (52.2 kg)   Height: 5\' 3"  (1.6 m)   PainSc:  3    Body mass index is 20.37 kg/m.     09/18/2022    2:02 PM 12/08/2021   11:58 AM 08/31/2021    2:04 PM  Advanced Directives  Does Patient Have a Medical Advance Directive? Yes Yes Yes  Type of Estate agent of [place];Living will Healthcare Power of [place];Living will Healthcare Power of [place];Living will  Copy of Healthcare Power of Attorney in Chart? No - copy requested  No - copy requested    Current Medications (verified) Outpatient Encounter Medications as of 09/18/2022  Medication Sig   acetaminophen (TYLENOL) 325 MG tablet Take 650 mg by mouth every 6 (six) hours as needed.   Alpha-Lipoic Acid 300 MG CAPS    amLODipine (NORVASC) 5 MG tablet TAKE 1 TABLET (5 MG TOTAL) BY MOUTH DAILY.   Ascorbic Acid (VITAMIN C) 100 MG tablet Take 100 mg by mouth daily.   azelastine (ASTELIN) 0.1 % nasal spray Place 1 spray into both nostrils 2 (two) times  daily.   budesonide ([employer] BUDESONIDE) 32 MCG/ACT nasal spray Place 1 spray into both nostrils daily.   Calcium Carbonate-Vit D-Min (CALTRATE 600+D PLUS MINERALS PO) Take 1 tablet by mouth daily.   cevimeline (EVOXAC) 30 MG capsule TAKE 1 CAPSULE BY MOUTH 3 TIMES DAILY.   cycloSPORINE (RESTASIS) 0.05 % ophthalmic emulsion Place 1 drop into both eyes 2 (two) times daily.   diazepam (VALIUM) 5 MG tablet Take 1 tablet (5 mg total) by mouth every 6 (six) hours as needed for anxiety.   diclofenac sodium (VOLTAREN) 1 % GEL APPLY EXTERNALLY TO THE AFFECTED AREA FOUR TIMES DAILY   DULoxetine (CYMBALTA) 30 MG capsule TAKE 1 CAPSULE BY MOUTH EVERY DAY   fexofenadine (ALLEGRA) 180 MG tablet Take 180 mg by mouth daily.   gabapentin (NEURONTIN) 100 MG capsule TAKE 2 CAPSULES (200 MG TOTAL) BY MOUTH 2 TIMES DAILY.   gabapentin (NEURONTIN) 600 MG tablet TAKE 1 TABLET BY MOUTH EVERY DAY   hydroxychloroquine (PLAQUENIL) 200 MG tablet TAKE 1 TABLET BY MOUTH EVERY DAY   IMVEXXY MAINTENANCE PACK 4 MCG INST SMARTSIG:1 Vaginal Twice a Week   levothyroxine (SYNTHROID) 75 MCG tablet Take 1 tablet (75 mcg total) by mouth daily before breakfast.   Melatonin 5 MG CAPS Take 1 tablet by mouth at bedtime.   meloxicam (MOBIC) 15 MG tablet TAKE 1 TABLET BY MOUTH EVERY DAY AS NEEDED FOR PAIN   montelukast (SINGULAIR) 10 MG tablet Take 1 tablet (  10 mg total) by mouth at bedtime.   Omega-3 Fatty Acids (FISH OIL) 1000 MG CAPS Take by mouth.   omeprazole (PRILOSEC) 20 MG capsule Take 20 mg by mouth daily.   polyethylene glycol (MIRALAX) 0.34 gm/ml SOLN    Propylene Glycol (SYSTANE BALANCE OP) Apply to eye.   SODIUM FLUORIDE 5000 PPM 1.1 % PSTE See admin instructions.   zoledronic acid (RECLAST) 5 MG/100ML SOLN injection Inject 5 mg into the vein once. Yearly infusion   zolpidem (AMBIEN) 5 MG tablet TAKE 1/2 TO 1 TABLET BY MOUTH NIGHTLY AS NEEDED FOR SLEEP   Facility-Administered Encounter Medications as of 09/18/2022  Medication    lidocaine (XYLOCAINE) 1 % (with pres) injection 10 mL   lidocaine HCl (PF) (XYLOCAINE) 2 % injection 5 mL    Allergies (verified) Clindamycin/lincomycin   History: Past Medical History:  Diagnosis Date   Acid reflux    Hypothyroid    Osteoarthritis    Sjogren's syndrome (HCC)    Spondylosis of lumbar spine    Past Surgical History:  Procedure Laterality Date   CATARACT EXTRACTION, BILATERAL     KNEE ARTHROSCOPY W/ MENISCECTOMY Left    Family History  Problem Relation Age of Onset   Stroke Mother    Heart disease Mother    Osteoarthritis Mother    Heart disease Father    Hypertension Father    Heart disease Paternal Uncle    Cancer Paternal Uncle        Lung   Social History   Socioeconomic History   Marital status: Married    Spouse name: Not on file   Number of children: 3   Years of education: Not on file   Highest education level: Bachelor's degree (e.g., BA, AB, BS)  Occupational History   Occupation: Retired  Tobacco Use   Smoking status: Never    Passive exposure: Never   Smokeless tobacco: Never  Vaping Use   Vaping status: Never Used  Substance and Sexual Activity   Alcohol use: Yes    Alcohol/week: 1.0 standard drink of alcohol    Types: 1 Cans of beer per week    Comment: socially   Drug use: Never   Sexual activity: Yes  Other Topics Concern   Not on file  Social History Narrative   Not on file   Social Determinants of Health   Financial Resource Strain: Low Risk  (09/14/2022)   Overall Financial Resource Strain (CARDIA)    Difficulty of Paying Living Expenses: Not hard at all  Food Insecurity: No Food Insecurity (09/14/2022)   Hunger Vital Sign    Worried About Running Out of Food in the Last Year: Never true    Ran Out of Food in the Last Year: Never true  Transportation Needs: No Transportation Needs (09/14/2022)   PRAPARE - Administrator, Civil Service (Medical): No    Lack of Transportation (Non-Medical): No   Physical Activity: Sufficiently Active (09/14/2022)   Exercise Vital Sign    Days of Exercise per Week: 5 days    Minutes of Exercise per Session: 40 min  Stress: No Stress Concern Present (09/14/2022)   [employer] of Occupational Health - Occupational Stress Questionnaire    Feeling of Stress : Only a little  Social Connections: Socially Integrated (09/14/2022)   Social Connection and Isolation Panel [NHANES]    Frequency of Communication with Friends and Family: More than three times a week    Frequency of Social Gatherings with Friends  and Family: Once a week    Attends Religious Services: More than 4 times per year    Active Member of Clubs or Organizations: Yes    Attends Engineer, structural: More than 4 times per year    Marital Status: Married    Tobacco Counseling Counseling given: Not Answered   Clinical Intake:  Pre-visit preparation completed: Yes  Pain : 0-10 Pain Score: 3  Pain Type: Chronic pain Pain Location: Back Pain Orientation: Lower Pain Descriptors / Indicators: Aching Pain Onset: More than a month ago Pain Frequency: Constant     Nutritional Status: BMI of 19-24  Normal Nutritional Risks: None Diabetes: No  How often do you need to have someone help you when you read instructions, pamphlets, or other written materials from your doctor or pharmacy?: 1 - Never  Interpreter Needed?: No  Information entered by :: NAllen LPN   Activities of Daily Living    09/14/2022    9:51 AM  In your present state of health, do you have any difficulty performing the following activities:  Hearing? 0  Vision? 0  Difficulty concentrating or making decisions? 0  Walking or climbing stairs? 0  Dressing or bathing? 0  Doing errands, shopping? 0  Preparing Food and eating ? N  Using the Toilet? N  In the past six months, have you accidently leaked urine? N  Do you have problems with loss of bowel control? N  Managing your Medications? N   Managing your Finances? N  Housekeeping or managing your Housekeeping? N    Patient Care Team: Loyola Mast, MD as PCP - General (Family Medicine) Fuller Plan, MD as Consulting Physician (Rheumatology) Zelphia Cairo, MD as Consulting Physician (Obstetrics and Gynecology) Wynn Banker Victorino Sparrow, MD as Consulting Physician (Physical Medicine and Rehabilitation)  Indicate any recent Medical Services you may have received from other than [facility] providers in the past year (date may be approximate).     Assessment:   This is a routine wellness examination for [place].  Hearing/Vision screen Hearing Screening - Comments:: Denies hearing issues Vision Screening - Comments:: Regular eye exams, [facility]  Dietary issues and exercise activities discussed:     Goals Addressed             This Visit's Progress    Patient Stated       09/18/2022, keep moving       Depression Screen    09/18/2022    2:03 PM 06/22/2022   10:58 AM 05/16/2022   10:30 AM 01/17/2022   11:49 AM 12/08/2021   11:58 AM 11/29/2021   10:46 AM 08/31/2021    2:05 PM  PHQ 2/9 Scores  PHQ - 2 Score 0 0 0 0 0 0 0  PHQ- 9 Score 0          Fall Risk    09/14/2022    9:51 AM 06/22/2022   10:58 AM 05/16/2022   10:30 AM 04/18/2022    8:19 AM 01/17/2022   11:49 AM  Fall Risk   Falls in the past year? 0 0 0 0 0  Number falls in past yr: 0 0 0 0   Injury with Fall? 0 0 0 0   Risk for fall due to : Medication side effect   No Fall Risks   Follow up Falls prevention discussed;Falls evaluation completed   Falls evaluation completed     MEDICARE RISK AT HOME:  Medicare Risk at Home - 09/18/22 1411  Any stairs in or around the home? Yes    If so, are there any without handrails? No    Home free of loose throw rugs in walkways, pet beds, electrical cords, etc? Yes    Adequate lighting in your home to reduce risk of falls? Yes    Life alert? No    Use of a cane, walker or w/c? No    Grab bars in the  bathroom? Yes    Shower chair or bench in shower? No    Elevated toilet seat or a handicapped toilet? No             TIMED UP AND GO:  Was the test performed?  No    Cognitive Function:        09/18/2022    2:03 PM  6CIT Screen  What Year? 0 points  What month? 0 points  What time? 0 points  Count back from 20 2 points  Months in reverse 0 points  Repeat phrase 2 points  Total Score 4 points    Immunizations Immunization History  Administered Date(s) Administered   Fluad Quad(high Dose 65+) 12/04/2018   Influenza Split 12/12/2015, 11/27/2020, 12/06/2021   Influenza, High Dose Seasonal PF 12/24/2017   PFIZER Comirnaty(Gray Top)Covid-19 Tri-Sucrose Vaccine 03/24/2019, 04/14/2019, 12/15/2019, 06/14/2020   Pfizer Covid-19 Vaccine Bivalent Booster 67yrs & up 12/09/2020, 12/06/2021   Pneumococcal Conjugate-13 11/29/2018   Pneumococcal Polysaccharide-23 08/12/2020   Tdap 03/26/2008, 10/07/2018   Zoster Recombinant(Shingrix) 11/26/2017, 02/05/2018   Zoster, Live 11/23/2011, 12/14/2011    TDAP status: Up to date  Flu Vaccine status: Up to date  Pneumococcal vaccine status: Up to date  Covid-19 vaccine status: Completed vaccines  Qualifies for Shingles Vaccine? Yes   Zostavax completed Yes   Shingrix Completed?: Yes  Screening Tests Health Maintenance  Topic Date Due   COVID-19 Vaccine (7 - 2023-24 season) 01/31/2022   MAMMOGRAM  09/22/2022   INFLUENZA VACCINE  09/28/2022   Medicare Annual Wellness (AWV)  09/18/2023   Colonoscopy  09/25/2025   DTaP/Tdap/Td (3 - Td or Tdap) 10/06/2028   Pneumonia Vaccine 48+ Years old  Completed   DEXA SCAN  Completed   Hepatitis C Screening  Completed   Zoster Vaccines- Shingrix  Completed   HPV VACCINES  Aged Out    Health Maintenance  Health Maintenance Due  Topic Date Due   COVID-19 Vaccine (7 - 2023-24 season) 01/31/2022    Colorectal cancer screening: Type of screening: Colonoscopy. Completed 09/26/2018. Repeat  every 7 years  Mammogram status: Completed 09/21/2021. Repeat every year  Bone Density status: Completed 09/20/2020.   Lung Cancer Screening: (Low Dose CT Chest recommended if Age 20-80 years, 20 pack-year currently smoking OR have quit w/in 15years.) does not qualify.   Lung Cancer Screening Referral: no  Additional Screening:  Hepatitis C Screening: does qualify; Completed 05/04/2021  Vision Screening: Recommended annual ophthalmology exams for early detection of glaucoma and other disorders of the eye. Is the patient up to date with their annual eye exam?  Yes  Who is the provider or what is the name of the office in which the patient attends annual eye exams? [facility] If pt is not established with a provider, would they like to be referred to a provider to establish care? No .   Dental Screening: Recommended annual dental exams for proper oral hygiene  Diabetic Foot Exam: n/a  Community Resource Referral / Chronic Care Management: CRR required this visit?  No   CCM required  this visit?  No     Plan:     I have personally reviewed and noted the following in the patient's chart:   Medical and social history Use of alcohol, tobacco or illicit drugs  Current medications and supplements including opioid prescriptions. Patient is not currently taking opioid prescriptions. Functional ability and status Nutritional status Physical activity Advanced directives List of other physicians Hospitalizations, surgeries, and ER visits in previous 12 months Vitals Screenings to include cognitive, depression, and falls Referrals and appointments  In addition, I have reviewed and discussed with patient certain preventive protocols, quality metrics, and best practice recommendations. A written personalized care plan for preventive services as well as general preventive health recommendations were provided to patient.     Barb Merino, LPN   1/61/0960   After Visit Summary:  (MyChart) Due to this being a telephonic visit, the after visit summary with patients personalized plan was offered to patient via MyChart   Nurse Notes: none

## 2022-09-20 ENCOUNTER — Other Ambulatory Visit: Payer: Self-pay | Admitting: Family Medicine

## 2022-09-20 DIAGNOSIS — I10 Essential (primary) hypertension: Secondary | ICD-10-CM

## 2022-09-28 NOTE — Progress Notes (Signed)
Office Visit Note  Patient: Kathleen [name]             Date of Birth: [birth year]           MRN: [id number]             PCP: Loyola Mast, MD Referring: Loyola Mast, MD Visit Date: 10/10/2022   Subjective:  Follow-up (Patient states she is having increased pain. )   History of Present Illness: Kathleen [name] is a 72 y.o. female here for follow up for Sjogren's syndrome on hydroxychloroquine 200 mg daily and cevimeline 30 mg 3 times daily.  She had some benefit in chronic low back pain with nerve ablation and working with physical therapy on this plus some related gluteal muscle pain or tendinopathy.  Currently having worse trouble with joint pain in her fingers mostly on the right hand and left wrist.  Worst at the second and fourth digit and getting some skin irritation between the fingers. Not seeing much visible swelling or discoloration.   Previous HPI 04/11/2022 Kathleen [name] is a 72 y.o. female here for follow up for Sjogren's syndrome on hydroxychloroquine 200 mg daily and cevimeline 30 mg 3 times daily.  Currently taking meloxicam 15 mg daily consistently worst area of pain currently in the back but also getting a little bit worse in her hands compared to last visit.  She is scheduled for lumbar spine radiofrequency ablation on the 20th.  On Systane and Restasis eyedrops.  She feels like her concentration and brain fog is also slightly worse than usual in the past month or 2.   Previous HPI 10/05/21 Kathleen [name] is a 72 y.o. female here for follow up for sjogren's syndrome on HCQ 200 mg daily. She is taking cevimeline 30 mg TID for dry mouth and using restasis and lubricating eye drops for dry eyes. She started using the Tyrvaya in the past 2 weeks so far not sure about the amount of difference. Eye dryness has possibly been a bit increased recently. She has pain and stiffness in her hands is bothersome as well. She takes meloxicam gabapentin and duloxetine largely for back  pain but also som help for hands. She has not seen much difference when try topical diclofenac. Saw Dr. Amanda Pea for evaluation of options with a finger cyst and her finger deformities, but currently holding off on any aggressive steps.   Previous HPI 04/07/2021 Kathleen [name] is a 72 y.o. female here for sjogren's syndrome on hydroxycloroquine 200 mg daily and cevimeline TID for dry mouth. She previously saw Dr. Ancil Linsey for this problem.  She was originally diagnosed after establishing care with him in 2016 with existing problems of chronic arthritis affecting facet joints knees and hands.  Work-up revealed a positive ANA with significant eye and mouth dryness symptoms no specific extractable nuclear antibodies identified.  She was not sure since a specific time point her symptoms developed somewhat progressive over preceding years.  She was started on hydroxychloroquine with partial symptom improvement as well as continuing topical diclofenac, oral meloxicam, gabapentin, and Cymbalta for chronic degenerative joint pain especially from the back.  Symptoms in her fingers and knees are persistent but not severely limiting of activity.  She has noticed some progression of the finger deformities over the past several years but usually without acute swelling redness or warmth. For her dry mouth symptoms she uses a Biotene spray drinks frequently and xylitol oral lozenges.  She takes cevimeline 3 times daily with a good  benefit and no significant intolerance.  She denies any pain or swelling around major salivary glands or cervical lymphadenopathy. For dry eyes she uses Systane drops also using Restasis drops in both eyes twice daily with a good benefit.  She never experiences any periorbital swelling has no previous major abrasions or other complications.  She has regular follow-up with her eye Dr. Billie Ruddy at [facility] most recent in August 2022 with normal OCT testing and annual follow-up.  She most recently  was started on a trial of Tyrvaya nasal spray she is not sure if there was a large change in symptoms but also did not continue the medicine for a long time due to lack of follow-up and finishing the sample amount.     Labs reviewed 06/2017 SSA neg SSB neg   12/2014 ANA 1:160 homogenous   Review of Systems  Constitutional:  Positive for fatigue.  HENT:  Positive for mouth dryness. Negative for mouth sores.   Eyes:  Positive for dryness.  Respiratory:  Negative for shortness of breath.   Cardiovascular:  Negative for chest pain and palpitations.  Gastrointestinal:  Positive for constipation. Negative for blood in stool and diarrhea.  Endocrine: Negative for increased urination.  Genitourinary:  Negative for involuntary urination.  Musculoskeletal:  Positive for joint pain, joint pain, myalgias, morning stiffness and myalgias. Negative for gait problem, joint swelling, muscle weakness and muscle tenderness.  Skin:  Negative for color change, rash, hair loss and sensitivity to sunlight.  Allergic/Immunologic: Negative for susceptible to infections.  Neurological:  Negative for dizziness and headaches.  Hematological:  Negative for swollen glands.  Psychiatric/Behavioral:  Positive for sleep disturbance. Negative for depressed mood. The patient is nervous/anxious.     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   Ganglion cyst of finger of left hand 07/03/2022   Family history of serrated polyposis syndrome (SPS)- Sister 11/01/2021   Dry eye syndrome of both eyes 10/18/2021   Sjogren's syndrome (HCC) 04/07/2021   Osteoarthritis of hands, bilateral 04/07/2021   Essential hypertension 02/27/2021   Chronic right sacroiliac pain 09/09/2020   Osteoporosis 11/29/2018   Piriformis syndrome of right side 10/23/2017   Spondylosis without myelopathy or radiculopathy, 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   Chronic myofascial pain  04/02/2017   ANA positive 01/25/2017   High risk medication use 05/30/2016   Xerostomia 05/03/2015   GERD (gastroesophageal reflux disease) 04/01/2015   IBS (irritable bowel syndrome) 04/01/2015   Insomnia 04/01/2015   Chronic seasonal allergic rhinitis due to pollen 02/04/2015   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   Tinnitus 02/04/2015   Multifactorial low back pain 12/15/2014   Personal history of other malignant neoplasm of skin 12/08/2011   Chronic back pain 08/24/2011   Hypothyroidism 02/28/1995    Past Medical History:  Diagnosis Date   Acid reflux    Hypothyroid    Osteoarthritis    Sjogren's syndrome (HCC)    Spondylosis of lumbar spine     Family History  Problem Relation Age of Onset   Stroke Mother    Heart disease Mother    Osteoarthritis Mother    Heart disease Father    Hypertension Father    Heart disease Paternal Uncle    Cancer Paternal Uncle        Lung   Past Surgical History:  Procedure Laterality Date   CATARACT EXTRACTION, BILATERAL     KNEE ARTHROSCOPY W/ MENISCECTOMY Left  Social History   Social History Narrative   Not on file   Immunization History  Administered Date(s) Administered   Fluad Quad(high Dose 65+) 12/04/2018   Influenza Split 12/12/2015, 11/27/2020, 12/06/2021   Influenza, High Dose Seasonal PF 12/24/2017   PFIZER Comirnaty(Gray Top)Covid-19 Tri-Sucrose Vaccine 03/24/2019, 04/14/2019, 12/15/2019, 06/14/2020   Pfizer Covid-19 Vaccine Bivalent Booster 32yrs & up 12/09/2020, 12/06/2021   Pneumococcal Conjugate-13 11/29/2018   Pneumococcal Polysaccharide-23 08/12/2020   Tdap 03/26/2008, 10/07/2018   Zoster Recombinant(Shingrix) 11/26/2017, 02/05/2018   Zoster, Live 11/23/2011, 12/14/2011     Objective: Vital Signs: BP 115/71 (BP Location: Left Arm, Patient Position: Sitting, Cuff Size: Normal)   Pulse 61   Resp 12   Ht 5\' 3"  (1.6 m)   Wt 115 lb (52.2 kg)   BMI 20.37 kg/m    Physical Exam HENT:      Mouth/Throat:     Mouth: Mucous membranes are dry.     Pharynx: Oropharynx is clear.  Eyes:     Conjunctiva/sclera: Conjunctivae normal.  Cardiovascular:     Rate and Rhythm: Normal rate and regular rhythm.  Pulmonary:     Effort: Pulmonary effort is normal.     Breath sounds: Normal breath sounds.  Musculoskeletal:     Right lower leg: No edema.     Left lower leg: No edema.  Lymphadenopathy:     Cervical: No cervical adenopathy.  Skin:    General: Skin is warm and dry.     Findings: No rash.  Neurological:     Mental Status: She is alert.  Psychiatric:        Mood and Affect: Mood normal.      Musculoskeletal Exam:  Shoulders full ROM no tenderness or swelling Elbows full ROM no tenderness or swelling Wrists full ROM left wrist tenderness on flexor side around FCU and small swelling present Fingers heberdon's nodes both hands, right hand 2nd PIP and 4th PIP widening with some swelling at 4th finger, lateral deviation and decreased flexion ROM Knees full ROM no tenderness or swelling   Investigation: No additional findings.  Imaging: No results found.  Recent Labs: Lab Results  Component Value Date   WBC 5.3 04/11/2022   HGB 13.0 04/11/2022   PLT 323 04/11/2022   NA 133 (L) 08/02/2022   K 4.1 08/02/2022   CL 98 08/02/2022   CO2 22 08/02/2022   GLUCOSE 102 (H) 08/02/2022   BUN 23 08/02/2022   CREATININE 0.84 08/02/2022   BILITOT 0.4 10/05/2021   ALKPHOS 51 05/05/2016   AST 20 10/05/2021   ALT 16 10/05/2021   PROT 6.6 10/05/2021   ALBUMIN 4.5 05/05/2016   CALCIUM 9.2 08/02/2022    Speciality Comments: PLQ EYE EXAM 03/13/2022 normal [facility] f/u 1 year  Procedures:  No procedures performed Allergies: Clindamycin/lincomycin   Assessment / Plan:     Visit Diagnoses: Sjogren's syndrome without extraglandular involvement (HCC) - Plan: Sedimentation rate, CBC with Differential/Platelet, C-reactive protein  Sjogren syndrome chronic dry mouth is  stable doing okay on cevimeline 3 times daily.  Continue hydroxychloroquine 200 mg daily.  Having significant hand pain may be more related to underlying OA but will also recheck serum inflammatory markers.  Inflammatory osteoarthritis  Discussed some swelling in the distal finger joints on both sides and probable ganglion cyst at the left wrist.  With this distribution and her significant underlying osteoarthritis I think that is probably the main cause.  Discussed options such as intra-articular steroid injection she would  hold off today.  Could follow-up as needed if symptoms get worse.  Previously saw and surgery no intervention recommended unless worsening function or pain problems.  High risk medication use - hydroxychloroquine 200 mg daily and the exovac 30 mg 3 times daily. PLQ EYE EXAM 03/13/2022 normal  Recent basic metabolic panel was checked in June reviewed this looks fine.  Will check complete blood count medication monitoring with long-term use of hydroxychloroquine.  Most recent Plaquenil eye exam from January was normal for 1 year follow-up.  Dry eye syndrome of both eyes - Systane and Restasis eyedrops.  Multifactorial low back pain   Better than last visit but still ongoing daily symptoms is getting partial benefit with physical therapy work.  On gabapentin and duloxetine maintenance.    Orders: Orders Placed This Encounter  Procedures   Sedimentation rate   CBC with Differential/Platelet   C-reactive protein   No orders of the defined types were placed in this encounter.    Follow-Up Instructions: Return in about 6 months (around 04/12/2023) for pSS on HCQ/cev f/u 6mos.   Fuller Plan, MD  Note - This record has been created using [employer].  Chart creation errors have been sought, but may not always  have been located. Such creation errors do not reflect on  the standard of medical care.

## 2022-10-03 ENCOUNTER — Encounter: Payer: Medicare Other | Attending: Physical Medicine & Rehabilitation | Admitting: Physical Medicine & Rehabilitation

## 2022-10-03 ENCOUNTER — Encounter: Payer: Self-pay | Admitting: Physical Medicine & Rehabilitation

## 2022-10-03 ENCOUNTER — Other Ambulatory Visit: Payer: Self-pay | Admitting: Physical Medicine & Rehabilitation

## 2022-10-03 VITALS — BP 136/76 | HR 61 | Ht 63.0 in | Wt 115.0 lb

## 2022-10-03 DIAGNOSIS — M47816 Spondylosis without myelopathy or radiculopathy, lumbar region: Secondary | ICD-10-CM | POA: Diagnosis not present

## 2022-10-03 DIAGNOSIS — M7918 Myalgia, other site: Secondary | ICD-10-CM

## 2022-10-03 DIAGNOSIS — G8929 Other chronic pain: Secondary | ICD-10-CM | POA: Diagnosis present

## 2022-10-03 DIAGNOSIS — M533 Sacrococcygeal disorders, not elsewhere classified: Secondary | ICD-10-CM | POA: Diagnosis not present

## 2022-10-03 MED ORDER — DICLOFENAC SODIUM 2 % EX SOLN: 2.0000 | Freq: Two times a day (BID) | CUTANEOUS | 2 refills | Status: DC | PRN

## 2022-10-03 MED ORDER — TRAMADOL HCL 50 MG PO TABS: 50.0000 mg | ORAL_TABLET | Freq: Two times a day (BID) | ORAL | 1 refills | Status: DC | PRN

## 2022-10-03 NOTE — Patient Instructions (Signed)
[facility] Address: [address], [place], [place] [address] [phone] Hours: Open today  8:00 am - 8:00 pm 

## 2022-10-03 NOTE — Progress Notes (Signed)
Subjective:    Patient ID: Kathleen [name], female    DOB: 09/28/1950, 72 y.o.   MRN: [id number]  HPI CC:  RIght>Left side low back pain   Low back pain with gluteal pain,  Prolonged standing and sitting worsen pain Left side is more involved than the right side  PT has noted left IT band pain Hand OA getting worse- tried voltaren gel at night has seen hand surgeon for consultation , no surgery planned unless symptoms get worse  72 year old female with history of lumbar spondylosis as well as right sacroiliac joint pain who returns today with complaints of persistent pain.  Normally her RF procedures help to a great degree. 05/16/2022 RightL5 dorsal ramus., Right L4 and Right L3 medial branch radio frequency neurotomy under fluoroscopic guidance   06/22/22- RIght sacroiliac RF Patient had a vacation where she did hiking.  She did experience some exacerbation of pain during that time. Patient remains independent with all self-care and mobility.  She does not utilize an assist device for ambulation Pain Inventory Average Pain 6 Pain Right Now 7 My pain is intermittent, constant, sharp, burning, tingling, and aching  In the last 24 hours, has pain interfered with the following? General activity 4 Relation with others 3 Enjoyment of life 3 What TIME of day is your pain at its worst? evening Sleep (in general) Fair  Pain is worse with: bending, sitting, inactivity, standing, and some activites Pain improves with: rest, heat/ice, therapy/exercise, and pacing activities Relief from Meds:  fair  Family History  Problem Relation Age of Onset   Stroke Mother    Heart disease Mother    Osteoarthritis Mother    Heart disease Father    Hypertension Father    Heart disease Paternal Uncle    Cancer Paternal Uncle        Lung   Social History   Socioeconomic History   Marital status: Married    Spouse name: Not on file   Number of children: 3   Years of education: Not on file    Highest education level: Bachelor's degree (e.g., BA, AB, BS)  Occupational History   Occupation: Retired  Tobacco Use   Smoking status: Never    Passive exposure: Never   Smokeless tobacco: Never  Vaping Use   Vaping status: Never Used  Substance and Sexual Activity   Alcohol use: Yes    Alcohol/week: 1.0 standard drink of alcohol    Types: 1 Cans of beer per week    Comment: socially   Drug use: Never   Sexual activity: Yes  Other Topics Concern   Not on file  Social History Narrative   Not on file   Social Determinants of Health   Financial Resource Strain: Low Risk  (09/14/2022)   Overall Financial Resource Strain (CARDIA)    Difficulty of Paying Living Expenses: Not hard at all  Food Insecurity: No Food Insecurity (09/14/2022)   Hunger Vital Sign    Worried About Running Out of Food in the Last Year: Never true    Ran Out of Food in the Last Year: Never true  Transportation Needs: No Transportation Needs (09/14/2022)   PRAPARE - Administrator, Civil Service (Medical): No    Lack of Transportation (Non-Medical): No  Physical Activity: Sufficiently Active (09/14/2022)   Exercise Vital Sign    Days of Exercise per Week: 5 days    Minutes of Exercise per Session: 40 min  Stress: No Stress Concern Present (  09/14/2022)   [place] [employer] - Occupational Stress Questionnaire    Feeling of Stress : Only a little  Social Connections: Socially Integrated (09/14/2022)   Social Connection and Isolation Panel [NHANES]    Frequency of Communication with Friends and Family: More than three times a week    Frequency of Social Gatherings with Friends and Family: Once a week    Attends Religious Services: More than 4 times per year    Active Member of [employer] or Organizations: Yes    Attends Engineer, structural: More than 4 times per year    Marital Status: Married   Past Surgical History:  Procedure Laterality Date   CATARACT EXTRACTION,  BILATERAL     KNEE ARTHROSCOPY W/ MENISCECTOMY Left    Past Surgical History:  Procedure Laterality Date   CATARACT EXTRACTION, BILATERAL     KNEE ARTHROSCOPY W/ MENISCECTOMY Left    Past Medical History:  Diagnosis Date   Acid reflux    Hypothyroid    Osteoarthritis    Sjogren's syndrome (HCC)    Spondylosis of lumbar spine    BP 136/76   Pulse 61   Ht 5\' 3"  (1.6 m)   Wt 115 lb (52.2 kg)   SpO2 96%   BMI 20.37 kg/m   Opioid Risk Score:   Fall Risk Score:  `1  Depression screen [facility] 2/9     10/03/2022   10:24 AM 09/18/2022    2:03 PM 06/22/2022   10:58 AM 05/16/2022   10:30 AM 01/17/2022   11:49 AM 12/08/2021   11:58 AM 11/29/2021   10:46 AM  Depression screen PHQ 2/9  Decreased Interest 0 0 0 0 0 0 0  Down, Depressed, Hopeless 0 0 0 0 0 0 0  PHQ - 2 Score 0 0 0 0 0 0 0  Altered sleeping  0       Tired, decreased energy  0       Change in appetite  0       Feeling bad or failure about yourself   0       Trouble concentrating  0       Moving slowly or fidgety/restless  0       Suicidal thoughts  0       PHQ-9 Score  0       Difficult doing work/chores  Not difficult at all         Review of Systems  Musculoskeletal:  Positive for back pain.       Right shoulder pain Left knee pain  All other systems reviewed and are negative.      Objective:   Physical Exam  Sacral thrust (prone) : Positive on right Lateral compression: Negative FABER's: Negative  distraction (supine): Negative Thigh thrust test: Positive on the right Lumbar spine minimal tenderness right lumbar paraspinal area There is pain with lumbar extension greater than with flexion. Ambulates without assist device no evidence toe drag and instability Lower extremity strength is normal Negative straight leg raise bilaterally Mood and affect appropriate      Assessment & Plan:   Lumbar spondylosis status post lumbar RF has done better in terms of the right-sided lumbar pain overall still  has some mild symptoms. 2.  Right sacroiliac disorder has only 2/5 provocative test positive at this point.  Myofascial pain gluteal, discussed more localized treatments for that.  She will discuss this with her physical therapy team.  She will continue  with her home exercise program stretching as well as core strengthening exercises. Severe osteoarthritis of the hands.  Prescribed Pennsaid 4 times daily to hands.  Trial of tramadol 50 mg twice daily if this is helpful she will need to sign a controlled substance agreement as well as do a urine drug screen.  At this point she will just get 60 tablets for 1 month as a trial    Try Dry needling for the glutes  Acupuncture is a treatment

## 2022-10-04 ENCOUNTER — Telehealth: Payer: Self-pay | Admitting: *Deleted

## 2022-10-04 NOTE — Telephone Encounter (Signed)
Aliviyah Myer Haff ([name]) PA Case ID #: [id number] Rx #: [id number]

## 2022-10-05 ENCOUNTER — Other Ambulatory Visit: Payer: Self-pay

## 2022-10-05 NOTE — Telephone Encounter (Signed)
Tramadol Approved 10/04/22-04/02/2023

## 2022-10-06 NOTE — Telephone Encounter (Signed)
  Kathleen [name] ([name]) - [phone] traMADol HCl 50MG  tablets Status: PA Response - ApprovedCreated: August 6th, 2024 [phone]

## 2022-10-10 ENCOUNTER — Encounter: Payer: Self-pay | Admitting: Internal Medicine

## 2022-10-10 ENCOUNTER — Ambulatory Visit: Payer: Medicare Other | Attending: Internal Medicine | Admitting: Internal Medicine

## 2022-10-10 VITALS — BP 115/71 | HR 61 | Resp 12 | Ht 63.0 in | Wt 115.0 lb

## 2022-10-10 DIAGNOSIS — Z79899 Other long term (current) drug therapy: Secondary | ICD-10-CM | POA: Insufficient documentation

## 2022-10-10 DIAGNOSIS — M3500 Sicca syndrome, unspecified: Secondary | ICD-10-CM | POA: Insufficient documentation

## 2022-10-10 DIAGNOSIS — H04123 Dry eye syndrome of bilateral lacrimal glands: Secondary | ICD-10-CM | POA: Insufficient documentation

## 2022-10-10 DIAGNOSIS — M47816 Spondylosis without myelopathy or radiculopathy, lumbar region: Secondary | ICD-10-CM | POA: Insufficient documentation

## 2022-10-10 DIAGNOSIS — M1990 Unspecified osteoarthritis, unspecified site: Secondary | ICD-10-CM | POA: Diagnosis present

## 2022-10-10 LAB — CBC WITH DIFFERENTIAL/PLATELET
Absolute Monocytes: 517 cells/uL (ref 200–950)
Basophils Absolute: 19 cells/uL (ref 0–200)
Basophils Relative: 0.3 %
Eosinophils Absolute: 82 cells/uL (ref 15–500)
Eosinophils Relative: 1.3 %
HCT: 38.6 % (ref 35.0–45.0)
Hemoglobin: 12.8 g/dL (ref 11.7–15.5)
Lymphs Abs: 1172 cells/uL (ref 850–3900)
MCH: 30 pg (ref 27.0–33.0)
MCHC: 33.2 g/dL (ref 32.0–36.0)
MCV: 90.6 fL (ref 80.0–100.0)
MPV: 9.3 fL (ref 7.5–12.5)
Monocytes Relative: 8.2 %
Neutro Abs: 4511 cells/uL (ref 1500–7800)
Neutrophils Relative %: 71.6 %
Platelets: 325 10*3/uL (ref 140–400)
RBC: 4.26 10*6/uL (ref 3.80–5.10)
RDW: 12.4 % (ref 11.0–15.0)
Total Lymphocyte: 18.6 %
WBC: 6.3 10*3/uL (ref 3.8–10.8)

## 2022-10-10 LAB — SEDIMENTATION RATE: Sed Rate: 2 mm/h (ref 0–30)

## 2022-10-10 NOTE — Patient Instructions (Signed)

## 2022-10-24 ENCOUNTER — Encounter: Payer: Self-pay | Admitting: Physical Medicine & Rehabilitation

## 2022-10-31 ENCOUNTER — Encounter: Payer: Medicare Other | Admitting: Physical Medicine & Rehabilitation

## 2022-11-01 ENCOUNTER — Other Ambulatory Visit: Payer: Self-pay | Admitting: *Deleted

## 2022-11-01 DIAGNOSIS — M3500 Sicca syndrome, unspecified: Secondary | ICD-10-CM

## 2022-11-01 NOTE — Telephone Encounter (Signed)
Patient contacted the office requesting a refill on Restasis.   Last Fill: 10/05/2021  Next Visit: 04/12/2023  Last Visit: 10/10/2022  Dx: Sjogren's syndrome without extraglandular involvement   Current Dose per office note on 10/10/2022: not discussed  Okay to refill Restasis?

## 2022-11-02 MED ORDER — CYCLOSPORINE 0.05 % OP EMUL
1.0000 [drp] | Freq: Two times a day (BID) | OPHTHALMIC | 0 refills | Status: DC
Start: 2022-11-02 — End: 2023-04-12

## 2022-11-02 NOTE — Progress Notes (Signed)
Sedimentation rate is normal so does not appear to have active inflammation.  Blood count, kidney function, and liver function are normal no problem for continuing hydroxychloroquine.

## 2022-11-03 ENCOUNTER — Encounter: Payer: Self-pay | Admitting: Physical Medicine & Rehabilitation

## 2022-11-03 ENCOUNTER — Encounter: Payer: Medicare Other | Attending: Physical Medicine & Rehabilitation | Admitting: Physical Medicine & Rehabilitation

## 2022-11-03 ENCOUNTER — Telehealth: Payer: Self-pay

## 2022-11-03 VITALS — BP 122/71 | HR 62 | Ht 63.0 in | Wt 114.2 lb

## 2022-11-03 DIAGNOSIS — M533 Sacrococcygeal disorders, not elsewhere classified: Secondary | ICD-10-CM | POA: Insufficient documentation

## 2022-11-03 DIAGNOSIS — G8929 Other chronic pain: Secondary | ICD-10-CM | POA: Insufficient documentation

## 2022-11-03 NOTE — Patient Instructions (Signed)
Call if you'd like to schedule repeat RF of R SI jt

## 2022-11-03 NOTE — Telephone Encounter (Signed)
Submitted a Prior Authorization request to [employer] for Restasis 0.05% [employer] via CoverMyMeds. Will update once we receive a response.

## 2022-11-03 NOTE — Progress Notes (Signed)
Subjective:    Patient ID: Kathleen [name], female    DOB: [birth year], 72 y.o.   MRN: [id number]  HPI 72 year old female with history of lumbar spondylosis right chronic SI joint pain as well as myofascial pain syndrome.  She also has osteoarthritis of both hands.  She was last seen approximately 1 ago.  In the interval time she has done some camping and hiking Bilateral low back pain  Has not tried acupuncture  Only tried tramadol a few times, has been traveling a lot  Pain Inventory Average Pain 6 Pain Right Now 4 My pain is constant, sharp, burning, tingling, and aching  In the last 24 hours, has pain interfered with the following? General activity 6 Relation with others 6 Enjoyment of life 5 What TIME of day is your pain at its worst? morning  and evening Sleep (in general) Fair  Pain is worse with: bending, sitting, inactivity, standing, and some activites Pain improves with: rest, heat/ice, therapy/exercise, and pacing activities Relief from Meds: 5  Family History  Problem Relation Age of Onset   Stroke Mother    Heart disease Mother    Osteoarthritis Mother    Heart disease Father    Hypertension Father    Heart disease Paternal Uncle    Cancer Paternal Uncle        Lung   Social History   Socioeconomic History   Marital status: Married    Spouse name: Not on file   Number of children: 3   Years of education: Not on file   Highest education level: Bachelor's degree (e.g., BA, AB, BS)  Occupational History   Occupation: Retired  Tobacco Use   Smoking status: Never    Passive exposure: Never   Smokeless tobacco: Never  Vaping Use   Vaping status: Never Used  Substance and Sexual Activity   Alcohol use: Yes    Alcohol/week: 1.0 standard drink of alcohol    Types: 1 Cans of beer per week    Comment: socially   Drug use: Never   Sexual activity: Yes  Other Topics Concern   Not on file  Social History Narrative   Not on file   Social Determinants of  Health   Financial Resource Strain: Low Risk  (09/14/2022)   Overall Financial Resource Strain (CARDIA)    Difficulty of Paying Living Expenses: Not hard at all  Food Insecurity: No Food Insecurity (09/14/2022)   Hunger Vital Sign    Worried About Running Out of Food in the Last Year: Never true    Ran Out of Food in the Last Year: Never true  Transportation Needs: No Transportation Needs (09/14/2022)   PRAPARE - Administrator, Civil Service (Medical): No    Lack of Transportation (Non-Medical): No  Physical Activity: Sufficiently Active (09/14/2022)   Exercise Vital Sign    Days of Exercise per Week: 5 days    Minutes of Exercise per Session: 40 min  Stress: No Stress Concern Present (09/14/2022)   [employer] of Occupational Health - Occupational Stress Questionnaire    Feeling of Stress : Only a little  Social Connections: Socially Integrated (09/14/2022)   Social Connection and Isolation Panel [NHANES]    Frequency of Communication with Friends and Family: More than three times a week    Frequency of Social Gatherings with Friends and Family: Once a week    Attends Religious Services: More than 4 times per year    Active Member of Clubs or  Organizations: Yes    Attends Engineer, structural: More than 4 times per year    Marital Status: Married   Past Surgical History:  Procedure Laterality Date   CATARACT EXTRACTION, BILATERAL     KNEE ARTHROSCOPY W/ MENISCECTOMY Left    Past Surgical History:  Procedure Laterality Date   CATARACT EXTRACTION, BILATERAL     KNEE ARTHROSCOPY W/ MENISCECTOMY Left    Past Medical History:  Diagnosis Date   Acid reflux    Hypothyroid    Osteoarthritis    Sjogren's syndrome (HCC)    Spondylosis of lumbar spine    BP 122/71   Pulse 62   Ht 5\' 3"  (1.6 m)   Wt 114 lb 3.2 oz (51.8 kg)   SpO2 97%   BMI 20.23 kg/m   Opioid Risk Score:   Fall Risk Score:  `1  Depression screen [facility] 2/9     10/03/2022   10:24 AM  09/18/2022    2:03 PM 06/22/2022   10:58 AM 05/16/2022   10:30 AM 01/17/2022   11:49 AM 12/08/2021   11:58 AM 11/29/2021   10:46 AM  Depression screen PHQ 2/9  Decreased Interest 0 0 0 0 0 0 0  Down, Depressed, Hopeless 0 0 0 0 0 0 0  PHQ - 2 Score 0 0 0 0 0 0 0  Altered sleeping  0       Tired, decreased energy  0       Change in appetite  0       Feeling bad or failure about yourself   0       Trouble concentrating  0       Moving slowly or fidgety/restless  0       Suicidal thoughts  0       PHQ-9 Score  0       Difficult doing work/chores  Not difficult at all           Review of Systems  Musculoskeletal:  Positive for back pain.  All other systems reviewed and are negative.     Objective:   Physical Exam  Sacral thrust (prone) :+ R Lateral compression: neg FABER's:  + R SI area Distraction (supine): Neg Thigh thrust test: + R  - SLR Lumbar ROM normal but pain with ext >Flexion Motor strength is 5/5 bilateral hip flexor knee extensor ankle dorsiflexor Ambulates without assistive device no evidence toe drag or knee instability There is tenderness over the gluteal musculature on the right side.      Assessment & Plan:   Lumbar spondylosis status post lumbar RF has done better in terms of the right-sided lumbar pain overall still has some mild symptoms.  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  Is ~6 mo post RF, we did discuss right sacroiliac fusion as a potential treatment option for pain.  We differentiated this from hip replacement surgery.  Myofascial pain gluteal, discussed more localized treatments for that.  She will discuss this with her physical therapy team.  She will continue with her home exercise program stretching as well as core strengthening exercises. Severe osteoarthritis of the hands.  Prescribed Pennsaid 4 times daily to hands, some insurance issues with this.  Trial of tramadol 50  mg she has only used a few of these at this point she will just get 60 tablets for 1 month as a trial.  At this  point does not look like this will be a chronically prescribed medication and she still has plenty of medication left from her original prescription 1 month ago.  Will hold off on UDS and controlled substance agreement

## 2022-11-05 ENCOUNTER — Other Ambulatory Visit: Payer: Self-pay | Admitting: Family Medicine

## 2022-11-05 DIAGNOSIS — E039 Hypothyroidism, unspecified: Secondary | ICD-10-CM

## 2022-11-06 NOTE — Telephone Encounter (Signed)
Received a fax regarding Prior Authorization from [employer] for Restasis 0.05% Emulsion . Authorization has been DENIED because Current plan approved criteria allows coverage of Restasis if the patient experienced a documented inadequate treatment response, intolerance, or contraindication to artificial tears products AND written chart notes are provided that document the inadequate treatment response, intolerance, or contraindication.   Phone# [phone]

## 2022-11-13 NOTE — Telephone Encounter (Signed)
Patient contacted the office and states to not work on the appeal yet. Patient states she is going to contact her eye doctor to see if they can prescribe her the restasis instead. Patient states she will call us back with an update and let us know if we need to cancel the prescription or not.

## 2022-11-21 ENCOUNTER — Ambulatory Visit: Payer: Medicare Other | Admitting: Physical Medicine & Rehabilitation

## 2022-11-28 ENCOUNTER — Other Ambulatory Visit: Payer: Self-pay | Admitting: Internal Medicine

## 2022-11-28 DIAGNOSIS — M3500 Sicca syndrome, unspecified: Secondary | ICD-10-CM

## 2022-11-28 NOTE — Telephone Encounter (Signed)
Last Fill: 08/21/2022  Eye exam: 03/13/2022 normal   Labs: 10/10/2022 CBC: Sedimentation rate is normal so does not appear to have active inflammation.  Blood count, kidney function, and liver function are normal no problem for continuing hydroxychloroquine.   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   Okay to refill Plaquenil?

## 2022-12-02 ENCOUNTER — Other Ambulatory Visit: Payer: Self-pay | Admitting: Internal Medicine

## 2022-12-02 DIAGNOSIS — M3500 Sicca syndrome, unspecified: Secondary | ICD-10-CM

## 2022-12-04 NOTE — Telephone Encounter (Signed)
Last Fill: 06/08/2022  Next Visit: 04/12/2023  Last Visit: 10/10/2022  Dx: Sjogren syndrome chronic dry mouth   Current Dose per office note on 10/10/2022: cevimeline 3 times daily   Okay to refill Cevimeline?

## 2022-12-15 ENCOUNTER — Encounter: Payer: Self-pay | Admitting: Family Medicine

## 2022-12-16 ENCOUNTER — Other Ambulatory Visit: Payer: Self-pay | Admitting: Family Medicine

## 2022-12-16 DIAGNOSIS — M47816 Spondylosis without myelopathy or radiculopathy, lumbar region: Secondary | ICD-10-CM

## 2022-12-28 ENCOUNTER — Telehealth: Payer: Self-pay

## 2022-12-28 NOTE — Telephone Encounter (Signed)
Spoke with patient, she had a colonoscopy in 2020. She would like all providers to be under [facility], will have reports faxed. No specific provider requested.

## 2023-01-18 LAB — HM MAMMOGRAPHY

## 2023-01-18 LAB — HM DEXA SCAN

## 2023-01-29 ENCOUNTER — Encounter: Payer: Self-pay | Admitting: Family Medicine

## 2023-02-01 ENCOUNTER — Ambulatory Visit: Payer: Medicare Other | Admitting: Family Medicine

## 2023-02-01 ENCOUNTER — Encounter: Payer: Self-pay | Admitting: Family Medicine

## 2023-02-01 VITALS — BP 118/76 | HR 68 | Temp 98.0°F | Ht 63.0 in | Wt 113.2 lb

## 2023-02-01 DIAGNOSIS — K219 Gastro-esophageal reflux disease without esophagitis: Secondary | ICD-10-CM

## 2023-02-01 DIAGNOSIS — Z9189 Other specified personal risk factors, not elsewhere classified: Secondary | ICD-10-CM | POA: Diagnosis not present

## 2023-02-01 DIAGNOSIS — Z13220 Encounter for screening for lipoid disorders: Secondary | ICD-10-CM | POA: Diagnosis not present

## 2023-02-01 DIAGNOSIS — J301 Allergic rhinitis due to pollen: Secondary | ICD-10-CM

## 2023-02-01 DIAGNOSIS — I10 Essential (primary) hypertension: Secondary | ICD-10-CM

## 2023-02-01 DIAGNOSIS — E039 Hypothyroidism, unspecified: Secondary | ICD-10-CM

## 2023-02-01 DIAGNOSIS — F5101 Primary insomnia: Secondary | ICD-10-CM

## 2023-02-01 DIAGNOSIS — M47816 Spondylosis without myelopathy or radiculopathy, lumbar region: Secondary | ICD-10-CM | POA: Diagnosis not present

## 2023-02-01 DIAGNOSIS — G8929 Other chronic pain: Secondary | ICD-10-CM

## 2023-02-01 DIAGNOSIS — M7918 Myalgia, other site: Secondary | ICD-10-CM

## 2023-02-01 LAB — LIPID PANEL
Cholesterol: 222 mg/dL — ABNORMAL HIGH (ref 0–200)
HDL: 69.8 mg/dL (ref >39.00)
LDL Cholesterol: 140 mg/dL — ABNORMAL HIGH (ref 0–99)
NonHDL: 152.04
Total CHOL/HDL Ratio: 3
Triglycerides: 61 mg/dL (ref 0.0–149.0)
VLDL: 12.2 mg/dL (ref 0.0–40.0)

## 2023-02-01 LAB — TSH: TSH: 1.46 u[IU]/mL (ref 0.35–5.50)

## 2023-02-01 MED ORDER — MONTELUKAST SODIUM 10 MG PO TABS: 10.0000 mg | ORAL_TABLET | Freq: Every day | ORAL | 1 refills | Status: DC

## 2023-02-01 MED ORDER — MELOXICAM 15 MG PO TABS: 15.0000 mg | ORAL_TABLET | Freq: Every day | ORAL | 3 refills | Status: DC

## 2023-02-01 MED ORDER — ZOLPIDEM TARTRATE 5 MG PO TABS: ORAL_TABLET | ORAL | 2 refills | Status: DC

## 2023-02-01 MED ORDER — OMEPRAZOLE 20 MG PO CPDR: 20.0000 mg | DELAYED_RELEASE_CAPSULE | Freq: Every day | ORAL | 3 refills | Status: DC

## 2023-02-01 NOTE — Assessment & Plan Note (Signed)
Patient continues to have chronic back pain.  Has seen multiple providers for this. I recommend that she discuss further with Dr. Dimple Casey, as this may be related to her autoimmune issues. Otherwise, she should continue her current meloxicam and hydroxychloroquine.

## 2023-02-01 NOTE — Progress Notes (Signed)
[facility] [address] [place] [address] Dept: [phone] Dept Fax: [phone]  Chronic Care Office Visit  Subjective:    Patient ID: Kathleen [name], female    DOB: 1950/12/21, 72 y.o..   MRN: [id number]  Chief Complaint  Patient presents with   Hypertension    6 month f/u.     History of Present Illness:  Patient is in today for reassessment of chronic medical issues.  Ms. Waguespack has a history of  hypertension.  She is managed on amlodipine 5 mg daily.   Ms. Nooner has a history of hypothyroidism. She is managed on levothyroxine 75 mcg daily.  Ms. Crose has a history of GERD. She has been managed on omeprazole. She is interested in reducing some of her medications.   Ms. Lave has a history of chronic low back pain due to a combination of arthritis, spondylosis, and sacroiliitis. She sees Dr. Wynn Banker (physiatry) who has tried various injections, which she does not feel has worked well for reducing her issues. She finds she still has significant pain, which does interfere with her sleep. She does use gabapentin, but is concerned with long term effects. She takes meloxicam daily. She tries to avoid taking tramadol. Ms. Goodno has a history of Sjogren's syndrome and inflammatory OA. She is managed on hydroxychloroquine and several medicines focused on dry eyes and dry mouth. She wonders if she might need to see another specialist, such as a neurosurgeon about her issues.  Ms. Sansing notes her husband is worried she might have sleep apnea. This is based on her loud snoring. she has not heard him say she pauses her breathing while sleeping. she does have some nonrestorative sleep, though this may related to her arthritis pain. She denies any daytime hypersomnolence.  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   Ganglion cyst of  finger of left hand 07/03/2022   Family history of serrated polyposis syndrome (SPS)- Sister 11/01/2021   Dry eye syndrome of both eyes 10/18/2021   Sjogren's syndrome (HCC) 04/07/2021   Osteoarthritis of hands, bilateral 04/07/2021   Essential hypertension 02/27/2021   Chronic right sacroiliac pain 09/09/2020   Osteoporosis 11/29/2018   Piriformis syndrome of right side 10/23/2017   Spondylosis without myelopathy or radiculopathy, 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   Chronic myofascial pain 04/02/2017   ANA positive 01/25/2017   High risk medication use 05/30/2016   Xerostomia 05/03/2015   GERD (gastroesophageal reflux disease) 04/01/2015   IBS (irritable bowel syndrome) 04/01/2015   Insomnia 04/01/2015   Chronic seasonal allergic rhinitis due to pollen 02/04/2015   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   Tinnitus 02/04/2015   Multifactorial low back pain 12/15/2014   Personal history of other malignant neoplasm of skin 12/08/2011   Chronic back pain 08/24/2011   Hypothyroidism 02/28/1995   Past Surgical History:  Procedure Laterality Date   CATARACT EXTRACTION, BILATERAL     KNEE ARTHROSCOPY W/ MENISCECTOMY Left    Family History  Problem Relation Age of Onset   Stroke Mother    Heart disease Mother    Osteoarthritis Mother    Heart disease Father    Hypertension Father    Heart disease Paternal Uncle    Cancer Paternal Uncle        Lung   Outpatient Medications Prior to Visit  Medication Sig Dispense Refill   acetaminophen (TYLENOL) 325 MG tablet Take 650 mg by  mouth every 6 (six) hours as needed.     Alpha-Lipoic Acid 300 MG CAPS      amLODipine (NORVASC) 5 MG tablet TAKE 1 TABLET (5 MG TOTAL) BY MOUTH DAILY. 90 tablet 3   Ascorbic Acid (VITAMIN C) 100 MG tablet Take 100 mg by mouth daily.     azelastine (ASTELIN) 0.1 % nasal spray Place 1 spray into both nostrils 2 (two) times daily.     budesonide ([employer] BUDESONIDE) 32 MCG/ACT nasal spray Place 1 spray into both  nostrils daily. 8.43 mL 5   Calcium Carbonate-Vit D-Min (CALTRATE 600+D PLUS MINERALS PO) Take 1 tablet by mouth daily.     cevimeline (EVOXAC) 30 MG capsule TAKE 1 CAPSULE BY MOUTH THREE TIMES A DAY 270 capsule 1   cycloSPORINE (RESTASIS) 0.05 % ophthalmic emulsion Place 1 drop into both eyes 2 (two) times daily. 90 each 0   diazepam (VALIUM) 5 MG tablet Take 1 tablet (5 mg total) by mouth every 6 (six) hours as needed for anxiety. 2 tablet 0   Diclofenac Sodium 1.5 % SOLN Apply 40 drops topically in the morning, at noon, in the evening, and at bedtime. 60 mL 1   DULoxetine (CYMBALTA) 30 MG capsule TAKE 1 CAPSULE BY MOUTH EVERY DAY 90 capsule 3   fexofenadine (ALLEGRA) 180 MG tablet Take 180 mg by mouth daily.     gabapentin (NEURONTIN) 100 MG capsule TAKE 2 CAPSULES (200 MG TOTAL) BY MOUTH 2 TIMES DAILY. 360 capsule 3   gabapentin (NEURONTIN) 600 MG tablet TAKE 1 TABLET BY MOUTH EVERY DAY 90 tablet 3   hydroxychloroquine (PLAQUENIL) 200 MG tablet TAKE 1 TABLET BY MOUTH EVERY DAY 90 tablet 1   IMVEXXY MAINTENANCE PACK 4 MCG INST SMARTSIG:1 Vaginal Twice a Week     levothyroxine (SYNTHROID) 75 MCG tablet TAKE 1 TABLET BY MOUTH DAILY BEFORE BREAKFAST. 90 tablet 3   Melatonin 5 MG CAPS Take 1 tablet by mouth at bedtime.     Omega-3 Fatty Acids (FISH OIL) 1000 MG CAPS Take by mouth.     polyethylene glycol (MIRALAX) 0.34 gm/ml SOLN      Propylene Glycol (SYSTANE BALANCE OP) Apply to eye.     SODIUM FLUORIDE 5000 PPM 1.1 % PSTE See admin instructions.     traMADol (ULTRAM) 50 MG tablet Take 1 tablet (50 mg total) by mouth every 12 (twelve) hours as needed. 60 tablet 1   zoledronic acid (RECLAST) 5 MG/100ML SOLN injection Inject 5 mg into the vein once. Yearly infusion     meloxicam (MOBIC) 15 MG tablet TAKE 1 TABLET BY MOUTH EVERY DAY AS NEEDED FOR PAIN 90 tablet 3   montelukast (SINGULAIR) 10 MG tablet Take 1 tablet (10 mg total) by mouth at bedtime. 90 tablet 1   omeprazole (PRILOSEC) 20 MG  capsule Take 20 mg by mouth daily.     zolpidem (AMBIEN) 5 MG tablet TAKE 1/2 TO 1 TABLET BY MOUTH NIGHTLY AS NEEDED FOR SLEEP 15 tablet 2   Facility-Administered Medications Prior to Visit  Medication Dose Route Frequency Provider Last Rate Last Admin   lidocaine (XYLOCAINE) 1 % (with pres) injection 10 mL  10 mL Other Once Kirsteins, Victorino Sparrow, MD       lidocaine HCl (PF) (XYLOCAINE) 2 % injection 5 mL  5 mL Other Once Kirsteins, Victorino Sparrow, MD       Allergies  Allergen Reactions   Clindamycin/Lincomycin Nausea Only   Objective:   Today's Vitals   02/01/23  1053  BP: 118/76  Pulse: 68  Temp: 98 F (36.7 C)  TempSrc: Temporal  SpO2: 97%  Weight: 113 lb 3.2 oz (51.3 kg)  Height: 5\' 3"  (1.6 m)   Body mass index is 20.05 kg/m.  Neck circumference: 32.5 cm  General: Well developed, well nourished. No acute distress. Psych: Alert and oriented. Normal mood and affect.  Health Maintenance Due  Topic Date Due   MAMMOGRAM  09/22/2022   STOP-Bang Score for Sleep Apnea Screening  Patient Self-Reported Questions         Score Do you snore loudly? (louder than  1  talking or sufficiently loud to be  heard through doors)  Do you often feel tired, fatigued, or  0  sleepy during the daytime?  Has anyone observed you stop breathing 0  during sleep?  Do you have (or are you being treated for) 1  high blood pressure?  Clinical Information          Score BMI>35 kg/m2     0  Age > 50 years    1  Neck circumference > 40 cm   0  Gender (female)     0  Total      3  A score <3 indicates a low risk of sleep apnea.    Assessment & Plan:   Problem List Items Addressed This Visit       Cardiovascular and Mediastinum   Essential hypertension    Blood pressure is in good control. Continue amlodipine 5 mg.        Respiratory   Chronic seasonal allergic rhinitis due to pollen    Stable. Continue montelukast 10 mg daily.      Relevant Medications   montelukast (SINGULAIR)  10 MG tablet     Digestive   GERD (gastroesophageal reflux disease)    I recommend she either try stopping her PPI or stepping down to an H2 blocker for 2 weeks and then stopping. I will renew her omeprazole for now.      Relevant Medications   omeprazole (PRILOSEC) 20 MG capsule     Endocrine   Hypothyroidism    Stable. Continue levothyroxine 75 mcg daily.      Relevant Orders   TSH     Musculoskeletal and Integument   Spondylosis without myelopathy or radiculopathy, lumbar region    Patient continues to have chronic back pain.  Has seen multiple providers for this. I recommend that she discuss further with Dr. Dimple Casey, as this may be related to her autoimmune issues. Otherwise, she should continue her current meloxicam and hydroxychloroquine.      Relevant Medications   meloxicam (MOBIC) 15 MG tablet     Other   Chronic myofascial pain    We discussed her try to taper her gabapentin, as she is unsure this is helping. I recommend she cut her nighttime dose form 600 mg to 300 mg.      Relevant Medications   meloxicam (MOBIC) 15 MG tablet   Insomnia    Stable. Continue Ambien.      Relevant Medications   zolpidem (AMBIEN) 5 MG tablet   Other Visit Diagnoses     At risk for sleep apnea    -  Primary   I suspect her snoring is not representative fo sleep apnea, but she does have a few risk factors. I will refer her to [facility] for a sleep study.   Relevant Orders   Ambulatory referral to Sleep Studies  Screening for lipid disorders       Relevant Orders   Lipid panel       Return in about 3 months (around 05/02/2023) for Reassessment.   Loyola Mast, MD

## 2023-02-01 NOTE — Assessment & Plan Note (Signed)
I recommend she either try stopping her PPI or stepping down to an H2 blocker for 2 weeks and then stopping. I will renew her omeprazole for now.

## 2023-02-01 NOTE — Assessment & Plan Note (Signed)
Stable.  Continue montelukast '10mg'$  daily

## 2023-02-01 NOTE — Assessment & Plan Note (Signed)
Blood pressure is in good control. Continue amlodipine 5 mg.

## 2023-02-01 NOTE — Assessment & Plan Note (Signed)
Stable. Continue levothyroxine 75 mcg daily. 

## 2023-02-01 NOTE — Assessment & Plan Note (Signed)
Stable.  Continue Ambien.

## 2023-02-01 NOTE — Assessment & Plan Note (Signed)
We discussed her try to taper her gabapentin, as she is unsure this is helping. I recommend she cut her nighttime dose form 600 mg to 300 mg.

## 2023-02-13 ENCOUNTER — Ambulatory Visit (INDEPENDENT_AMBULATORY_CARE_PROVIDER_SITE_OTHER): Payer: Medicare Other | Admitting: Family Medicine

## 2023-02-13 VITALS — BP 108/62 | HR 60 | Temp 97.9°F | Ht 63.0 in | Wt 113.2 lb

## 2023-02-13 DIAGNOSIS — K589 Irritable bowel syndrome without diarrhea: Secondary | ICD-10-CM | POA: Diagnosis not present

## 2023-02-13 DIAGNOSIS — R197 Diarrhea, unspecified: Secondary | ICD-10-CM

## 2023-02-13 NOTE — Progress Notes (Signed)
[facility] [address] [place] [address] Dept: [phone] Dept Fax: [phone]  Office Visit  Subjective:    Patient ID: Kathleen [name], female    DOB: 01-19-1951, 72 y.o..   MRN: [id number]  Chief Complaint  Patient presents with   Diarrhea    C/o having diarrhea off/on.     History of Present Illness:  Patient is in today complaining of a 1-week history of diarrhea. She has a history of chronic constipation, which she manages with Miralax. About a week ago, she began having loose stools associated with urgency. She notes she only has the one bowel movement a day. She has some abdominal cramping or mild discomfort. She is not runny fever, having achiness, or nausea. She has seen no blood in her stools. She has not eaten any food she was worried about. She has a past history of IBS, but diarrhea has not been a usual issue. She does note that she was eating salads more often around the time this started.   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   Essential hypertension 02/27/2021   Chronic right sacroiliac pain 09/09/2020   Osteoporosis 11/29/2018   Piriformis syndrome of right side 10/23/2017   Spondylosis without myelopathy or radiculopathy, 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   Chronic myofascial pain 04/02/2017   ANA positive 01/25/2017   High risk medication use 05/30/2016   Xerostomia 05/03/2015   GERD (gastroesophageal reflux disease) 04/01/2015   IBS (irritable bowel syndrome) 04/01/2015   Insomnia 04/01/2015   Chronic seasonal allergic rhinitis due to pollen 02/04/2015   Sensorineural hearing  loss of both ears 02/04/2015   Tinnitus 02/04/2015   Multifactorial low back pain 12/15/2014   Personal history of other malignant neoplasm of skin 12/08/2011   Chronic back pain 08/24/2011   Hypothyroidism 02/28/1995   Past Surgical History:  Procedure Laterality Date   CATARACT EXTRACTION, BILATERAL     KNEE ARTHROSCOPY W/ MENISCECTOMY Left    Family History  Problem Relation Age of Onset   Stroke Mother    Heart disease Mother    Osteoarthritis Mother    Heart disease Father    Hypertension Father    Heart disease Paternal Uncle    Cancer Paternal Uncle        Lung   Outpatient Medications Prior to Visit  Medication Sig Dispense Refill   acetaminophen (TYLENOL) 325 MG tablet Take 650 mg by mouth every 6 (six) hours as needed.     Alpha-Lipoic Acid 300 MG CAPS      amLODipine (NORVASC) 5 MG tablet TAKE 1 TABLET (5 MG TOTAL) BY MOUTH DAILY. 90 tablet 3   Ascorbic Acid (VITAMIN C) 100 MG tablet Take 100 mg by mouth daily.     Calcium Carbonate-Vit D-Min (CALTRATE 600+D PLUS MINERALS PO) Take 1 tablet by mouth daily.     cevimeline (EVOXAC) 30 MG capsule TAKE 1 CAPSULE BY MOUTH THREE TIMES A DAY 270 capsule 1   cycloSPORINE (RESTASIS) 0.05 % ophthalmic emulsion Place 1 drop into both eyes 2 (two) times daily. 90 each 0   diazepam (VALIUM) 5 MG tablet Take 1 tablet (5 mg total) by mouth  every 6 (six) hours as needed for anxiety. 2 tablet 0   Diclofenac Sodium 1.5 % SOLN Apply 40 drops topically in the morning, at noon, in the evening, and at bedtime. 60 mL 1   DULoxetine (CYMBALTA) 30 MG capsule TAKE 1 CAPSULE BY MOUTH EVERY DAY 90 capsule 3   fexofenadine (ALLEGRA) 180 MG tablet Take 180 mg by mouth daily.     gabapentin (NEURONTIN) 100 MG capsule TAKE 2 CAPSULES (200 MG TOTAL) BY MOUTH 2 TIMES DAILY. 360 capsule 3   gabapentin (NEURONTIN) 600 MG tablet TAKE 1 TABLET BY MOUTH EVERY DAY 90 tablet 3   hydroxychloroquine (PLAQUENIL) 200 MG tablet TAKE 1 TABLET BY MOUTH EVERY DAY 90  tablet 1   IMVEXXY MAINTENANCE PACK 4 MCG INST SMARTSIG:1 Vaginal Twice a Week     levothyroxine (SYNTHROID) 75 MCG tablet TAKE 1 TABLET BY MOUTH DAILY BEFORE BREAKFAST. 90 tablet 3   Melatonin 5 MG CAPS Take 1 tablet by mouth at bedtime.     meloxicam (MOBIC) 15 MG tablet Take 1 tablet (15 mg total) by mouth daily. 90 tablet 3   montelukast (SINGULAIR) 10 MG tablet Take 1 tablet (10 mg total) by mouth at bedtime. 90 tablet 1   Omega-3 Fatty Acids (FISH OIL) 1000 MG CAPS Take by mouth.     omeprazole (PRILOSEC) 20 MG capsule Take 1 capsule (20 mg total) by mouth daily. 90 capsule 3   polyethylene glycol (MIRALAX) 0.34 gm/ml SOLN      Propylene Glycol (SYSTANE BALANCE OP) Apply to eye.     SODIUM FLUORIDE 5000 PPM 1.1 % PSTE See admin instructions.     traMADol (ULTRAM) 50 MG tablet Take 1 tablet (50 mg total) by mouth every 12 (twelve) hours as needed. 60 tablet 1   zoledronic acid (RECLAST) 5 MG/100ML SOLN injection Inject 5 mg into the vein once. Yearly infusion     zolpidem (AMBIEN) 5 MG tablet TAKE 1/2 TO 1 TABLET BY MOUTH NIGHTLY AS NEEDED FOR SLEEP 15 tablet 2   azelastine (ASTELIN) 0.1 % nasal spray Place 1 spray into both nostrils 2 (two) times daily. (Patient not taking: Reported on 02/13/2023)     budesonide ([employer] BUDESONIDE) 32 MCG/ACT nasal spray Place 1 spray into both nostrils daily. (Patient not taking: Reported on 02/13/2023) 8.43 mL 5   Facility-Administered Medications Prior to Visit  Medication Dose Route Frequency Provider Last Rate Last Admin   lidocaine (XYLOCAINE) 1 % (with pres) injection 10 mL  10 mL Other Once Kirsteins, Victorino Sparrow, MD       lidocaine HCl (PF) (XYLOCAINE) 2 % injection 5 mL  5 mL Other Once Kirsteins, Victorino Sparrow, MD       Allergies  Allergen Reactions   Clindamycin/Lincomycin Nausea Only     Objective:   Today's Vitals   02/13/23 1510  BP: 108/62  Pulse: 60  Temp: 97.9 F (36.6 C)  TempSrc: Temporal  SpO2: 100%  Weight: 113 lb 3.2 oz (51.3 kg)   Height: 5\' 3"  (1.6 m)   Body mass index is 20.05 kg/m.   General: Well developed, well nourished. No acute distress. Abdomen: Soft, non-tender.  Psych: Alert and oriented. Normal mood and affect.  Health Maintenance Due  Topic Date Due   MAMMOGRAM  09/22/2022   Assessment & Plan:  1. Diarrhea, unspecified type (Primary) 2. Irritable bowel syndrome, unspecified type  The nature of the symptoms does not sound like an acute infectious issue. I suspect this may be  an aspect of her IBS. We discussed low FOPDMAP diets. I also recommend she try using Imodium, esp. if she is traveling where she may not have ready access to a toilet. If symptoms are not resolving over the next 1-2 weeks, she should follow up.   Return if symptoms worsen or fail to improve.   Loyola Mast, MD

## 2023-02-13 NOTE — Patient Instructions (Signed)
Low-FODMAP Eating Plan  FODMAP stands for fermentable oligosaccharides, disaccharides, monosaccharides, and polyols. These are sugars that are hard for some people to digest. A low-FODMAP eating plan may help some people who have irritable bowel syndrome (IBS) and certain other bowel (intestinal) diseases to manage their symptoms. This meal plan can be complicated to follow. Work with a diet and nutrition specialist (dietitian) to make a low-FODMAP eating plan that is right for you. A dietitian can help make sure that you get enough nutrition from this diet. What are tips for following this plan? Reading food labels Check labels for hidden FODMAPs such as: High-fructose syrup. Honey. Agave. Natural fruit flavors. Onion or garlic powder. Choose low-FODMAP foods that contain 3-4 grams of fiber per serving. Check food labels for serving sizes. Eat only one serving at a time to make sure FODMAP levels stay low. Shopping Shop with a list of foods that are recommended on this diet and make a meal plan. Meal planning Follow a low-FODMAP eating plan for up to 6 weeks, or as told by your health care provider or dietitian. To follow the eating plan: Eliminate high-FODMAP foods from your diet completely. Choose only low-FODMAP foods to eat. You will do this for 2-6 weeks. Gradually reintroduce high-FODMAP foods into your diet one at a time. Most people should wait a few days before introducing the next new high-FODMAP food into their meal plan. Your dietitian can recommend how quickly you may reintroduce foods. Keep a daily record of what and how much you eat and drink. Make note of any symptoms that you have after eating. Review your daily record with a dietitian regularly to identify which foods you can eat and which foods you should avoid. General tips Drink enough fluid each day to keep your urine pale yellow. Avoid processed foods. These often have added sugar and may be high in FODMAPs. Avoid  most dairy products, whole grains, and sweeteners. Work with a dietitian to make sure you get enough fiber in your diet. Avoid high FODMAP foods at meals to manage symptoms. Recommended foods Fruits Bananas, oranges, tangerines, lemons, limes, blueberries, raspberries, strawberries, grapes, cantaloupe, honeydew melon, kiwi, papaya, passion fruit, and pineapple. Limited amounts of dried cranberries, banana chips, and shredded coconut. Vegetables Eggplant, zucchini, cucumber, peppers, green beans, bean sprouts, lettuce, arugula, kale, Swiss chard, spinach, collard greens, bok choy, summer squash, potato, and tomato. Limited amounts of corn, carrot, and sweet potato. Green parts of scallions. Grains Gluten-free grains, such as rice, oats, buckwheat, quinoa, corn, polenta, and millet. Gluten-free pasta, bread, or cereal. Rice noodles. Corn tortillas. Meats and other proteins Unseasoned beef, pork, poultry, or fish. Eggs. Bacon. Tofu (firm) and tempeh. Limited amounts of nuts and seeds, such as almonds, walnuts, brazil nuts, pecans, peanuts, nut butters, pumpkin seeds, chia seeds, and sunflower seeds. Dairy Lactose-free milk, yogurt, and kefir. Lactose-free cottage cheese and ice cream. Non-dairy milks, such as almond, coconut, hemp, and rice milk. Non-dairy yogurt. Limited amounts of goat cheese, brie, mozzarella, parmesan, swiss, and other hard cheeses. Fats and oils Butter-free spreads. Vegetable oils, such as olive, canola, and sunflower oil. Seasoning and other foods Artificial sweeteners with names that do not end in "ol," such as aspartame, saccharine, and stevia. Maple syrup, white table sugar, raw sugar, brown sugar, and molasses. Mayonnaise, soy sauce, and tamari. Fresh basil, coriander, parsley, rosemary, and thyme. Beverages Water and mineral water. Sugar-sweetened soft drinks. Small amounts of orange juice or cranberry juice. Black and green tea. Most dry wines.   Coffee. The items listed  above may not be a complete list of foods and beverages you can eat. Contact a dietitian for more information. Foods to avoid Fruits Fresh, dried, and juiced forms of apple, pear, watermelon, peach, plum, cherries, apricots, blackberries, boysenberries, figs, nectarines, and mango. Avocado. Vegetables Chicory root, artichoke, asparagus, cabbage, snow peas, Brussels sprouts, broccoli, sugar snap peas, mushrooms, celery, and cauliflower. Onions, garlic, leeks, and the white part of scallions. Grains Wheat, including kamut, durum, and semolina. Barley and bulgur. Couscous. Wheat-based cereals. Wheat noodles, bread, crackers, and pastries. Meats and other proteins Fried or fatty meat. Sausage. Cashews and pistachios. Soybeans, baked beans, black beans, chickpeas, kidney beans, fava beans, navy beans, lentils, black-eyed peas, and split peas. Dairy Milk, yogurt, ice cream, and soft cheese. Cream and sour cream. Milk-based sauces. Custard. Buttermilk. Soy milk. Seasoning and other foods Any sugar-free gum or candy. Foods that contain artificial sweeteners such as sorbitol, mannitol, isomalt, or xylitol. Foods that contain honey, high-fructose corn syrup, or agave. Bouillon, vegetable stock, beef stock, and chicken stock. Garlic and onion powder. Condiments made with onion, such as hummus, chutney, pickles, relish, salad dressing, and salsa. Tomato paste. Beverages Chicory-based drinks. Coffee substitutes. Chamomile tea. Fennel tea. Sweet or fortified wines such as port or sherry. Diet soft drinks made with isomalt, mannitol, maltitol, sorbitol, or xylitol. Apple, pear, and mango juice. Juices with high-fructose corn syrup. The items listed above may not be a complete list of foods and beverages you should avoid. Contact a dietitian for more information. Summary FODMAP stands for fermentable oligosaccharides, disaccharides, monosaccharides, and polyols. These are sugars that are hard for some people to  digest. A low-FODMAP eating plan is a short-term diet that helps to ease symptoms of certain bowel diseases. The eating plan usually lasts up to 6 weeks. After that, high-FODMAP foods are reintroduced gradually and one at a time. This can help you find out which foods may be causing symptoms. A low-FODMAP eating plan can be complicated. It is best to work with a dietitian who has experience with this type of plan. This information is not intended to replace advice given to you by your health care provider. Make sure you discuss any questions you have with your health care provider. Document Revised: 07/03/2019 Document Reviewed: 07/03/2019 Elsevier Patient Education  2024 [employer]  

## 2023-03-01 ENCOUNTER — Other Ambulatory Visit: Payer: Self-pay | Admitting: Family Medicine

## 2023-03-01 DIAGNOSIS — E039 Hypothyroidism, unspecified: Secondary | ICD-10-CM

## 2023-03-08 ENCOUNTER — Other Ambulatory Visit: Payer: Self-pay | Admitting: Internal Medicine

## 2023-03-08 DIAGNOSIS — M3500 Sicca syndrome, unspecified: Secondary | ICD-10-CM

## 2023-03-08 NOTE — Telephone Encounter (Signed)
 Last Fill: 11/28/2022  Eye exam: 03/13/2022   Labs: 10/10/2022 Sedimentation rate is normal so does not appear to have active inflammation.  Blood count, kidney function, and liver function are normal no problem for continuing hydroxychloroquine .   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

## 2023-03-12 ENCOUNTER — Encounter: Payer: Self-pay | Admitting: Neurology

## 2023-03-12 ENCOUNTER — Ambulatory Visit (INDEPENDENT_AMBULATORY_CARE_PROVIDER_SITE_OTHER): Payer: Medicare Other | Admitting: Neurology

## 2023-03-12 VITALS — BP 126/70 | HR 58 | Ht 63.0 in | Wt 114.2 lb

## 2023-03-12 DIAGNOSIS — R351 Nocturia: Secondary | ICD-10-CM | POA: Diagnosis not present

## 2023-03-12 DIAGNOSIS — G478 Other sleep disorders: Secondary | ICD-10-CM | POA: Diagnosis not present

## 2023-03-12 DIAGNOSIS — R0683 Snoring: Secondary | ICD-10-CM

## 2023-03-12 DIAGNOSIS — Z9189 Other specified personal risk factors, not elsewhere classified: Secondary | ICD-10-CM | POA: Diagnosis not present

## 2023-03-12 DIAGNOSIS — R0681 Apnea, not elsewhere classified: Secondary | ICD-10-CM

## 2023-03-12 DIAGNOSIS — Z820 Family history of epilepsy and other diseases of the nervous system: Secondary | ICD-10-CM

## 2023-03-12 NOTE — Patient Instructions (Signed)

## 2023-03-12 NOTE — Progress Notes (Addendum)
 Subjective:    Patient ID: Kathleen [name] is a 73 y.o. female.  HPI    True Mar, MD, PhD [facility] [address], [address] [address] [place], [place] [address]  Dear Dr. Thedora,  I saw your patient Kathleen [name], upon your kind request in my sleep clinic today for initial consultation of her sleep disorder, in particular, concern for underlying obstructive sleep apnea.  The patient is unaccompanied today.  As you know, Kathleen [name] is a 73 year old female with an underlying medical history of allergies, hypothyroidism, reflux disease, irritable bowel syndrome, tinnitus, hypertension, spondylosis of the lumbar spine with chronic low back pain, Sjogren's syndrome, osteoporosis and osteoarthritis, who reports snoring and sleep disruption, as well as significant nocturia.  Her husband has noted pauses in her breathing or gasping sounds while she is asleep.  Her Epworth sleepiness score is 4 out of 24, fatigue severity score is 25 out of 63.  I reviewed your office note from 02/01/2023.  She lives with her husband, they have 2 grown children, older daughter and younger son.  They are both in their 40s.  They have 1 grandchild.  She is retired, was a acupuncturist.  Bedtime is generally around 9, she likes to read before falling asleep and does not typically have much in the way of difficulty falling asleep, occasionally uses melatonin and occasionally may use half an Ambien , 5 mg strength.  She does have a tendency for anxiety and stress and rumination of thought.  Her daughter was recently diagnosed with obstructive sleep apnea.  Patient reports nocturia about 3 times per average night, sometimes more, denies any recurrent nocturnal morning headaches.  She is a non-smoker and does not drink caffeine daily, no alcohol daily, only occasionally.  She does not have a TV in her bedroom, she and her husband typically sleep in separate bedrooms.  She is a side sleeper.  Her Past  Medical History Is Significant For: Past Medical History:  Diagnosis Date   Acid reflux    Hypothyroid    Osteoarthritis    Sjogren's syndrome (HCC)    Spondylosis of lumbar spine     Her Past Surgical History Is Significant For: Past Surgical History:  Procedure Laterality Date   CATARACT EXTRACTION, BILATERAL     KNEE ARTHROSCOPY W/ MENISCECTOMY Left     Her Family History Is Significant For: Family History  Problem Relation Age of Onset   Stroke Mother    Heart disease Mother    Osteoarthritis Mother    Heart disease Father    Hypertension Father    Heart disease Paternal Uncle    Cancer Paternal Uncle        Lung    Her Social History Is Significant For: Social History   Socioeconomic History   Marital status: Married    Spouse name: Not on file   Number of children: 3   Years of education: Not on file   Highest education level: Bachelor's degree (e.g., BA, AB, BS)  Occupational History   Occupation: Retired  Tobacco Use   Smoking status: Never    Passive exposure: Never   Smokeless tobacco: Never  Vaping Use   Vaping status: Never Used  Substance and Sexual Activity   Alcohol use: Yes    Alcohol/week: 1.0 standard drink of alcohol    Types: 1 Cans of beer per week    Comment: socially   Drug use: Never   Sexual activity: Yes  Other Topics  Concern   Not on file  Social History Narrative   Caffiene rare.   Working retired.     Social Drivers of Corporate Investment Banker Strain: Low Risk  (02/13/2023)   Overall Financial Resource Strain (CARDIA)    Difficulty of Paying Living Expenses: Not hard at all  Food Insecurity: No Food Insecurity (02/13/2023)   Hunger Vital Sign    Worried About Running Out of Food in the Last Year: Never true    Ran Out of Food in the Last Year: Never true  Transportation Needs: No Transportation Needs (02/13/2023)   PRAPARE - Administrator, Civil Service (Medical): No    Lack of Transportation  (Non-Medical): No  Physical Activity: Sufficiently Active (02/13/2023)   Exercise Vital Sign    Days of Exercise per Week: 5 days    Minutes of Exercise per Session: 30 min  Recent Concern: Physical Activity - Insufficiently Active (01/29/2023)   Exercise Vital Sign    Days of Exercise per Week: 5 days    Minutes of Exercise per Session: 20 min  Stress: No Stress Concern Present (02/13/2023)   [employer] of Occupational Health - Occupational Stress Questionnaire    Feeling of Stress : Only a little  Social Connections: Socially Integrated (02/13/2023)   Social Connection and Isolation Panel [NHANES]    Frequency of Communication with Friends and Family: More than three times a week    Frequency of Social Gatherings with Friends and Family: Once a week    Attends Religious Services: More than 4 times per year    Active Member of [employer] or Organizations: Yes    Attends Engineer, Structural: More than 4 times per year    Marital Status: Married    Her Allergies Are:  Allergies  Allergen Reactions   Clindamycin/Lincomycin Nausea Only  :   Her Current Medications Are:  Outpatient Encounter Medications as of 03/12/2023  Medication Sig   acetaminophen  (TYLENOL ) 325 MG tablet Take 650 mg by mouth every 6 (six) hours as needed.   Alpha-Lipoic Acid 300 MG CAPS    amLODipine  (NORVASC ) 5 MG tablet TAKE 1 TABLET (5 MG TOTAL) BY MOUTH DAILY.   Ascorbic Acid (VITAMIN C) 100 MG tablet Take 100 mg by mouth daily.   budesonide  ([employer] BUDESONIDE ) 32 MCG/ACT nasal spray Place 1 spray into both nostrils daily.   Calcium  Carbonate-Vit D-Min (CALTRATE 600+D PLUS MINERALS PO) Take 1 tablet by mouth daily.   cevimeline  (EVOXAC ) 30 MG capsule TAKE 1 CAPSULE BY MOUTH THREE TIMES A DAY   cycloSPORINE  (RESTASIS ) 0.05 % ophthalmic emulsion Place 1 drop into both eyes 2 (two) times daily.   diazepam  (VALIUM ) 5 MG tablet Take 1 tablet (5 mg total) by mouth every 6 (six) hours as needed for anxiety.    Diclofenac  Sodium 1.5 % SOLN Apply 40 drops topically in the morning, at noon, in the evening, and at bedtime.   DULoxetine  (CYMBALTA ) 30 MG capsule TAKE 1 CAPSULE BY MOUTH EVERY DAY   fexofenadine (ALLEGRA) 180 MG tablet Take 180 mg by mouth daily.   gabapentin  (NEURONTIN ) 100 MG capsule TAKE 2 CAPSULES (200 MG TOTAL) BY MOUTH 2 TIMES DAILY.   gabapentin  (NEURONTIN ) 600 MG tablet TAKE 1 TABLET BY MOUTH EVERY DAY   hydroxychloroquine  (PLAQUENIL ) 200 MG tablet TAKE ONE TABLET BY MOUTH EVERY DAY   IMVEXXY MAINTENANCE PACK 4 MCG INST SMARTSIG:1 Vaginal Twice a Week   levothyroxine  (SYNTHROID ) 75 MCG tablet TAKE 1  TABLET BY MOUTH DAILY BEFORE BREAKFAST.   Melatonin 5 MG CAPS Take 1 tablet by mouth at bedtime.   meloxicam  (MOBIC ) 15 MG tablet Take 1 tablet (15 mg total) by mouth daily.   montelukast  (SINGULAIR ) 10 MG tablet Take 1 tablet (10 mg total) by mouth at bedtime.   Omega-3 Fatty Acids (FISH OIL) 1000 MG CAPS Take by mouth.   omeprazole  (PRILOSEC) 20 MG capsule Take 1 capsule (20 mg total) by mouth daily.   polyethylene glycol (MIRALAX) 0.34 gm/ml SOLN    Propylene Glycol (SYSTANE BALANCE OP) Apply to eye.   SODIUM FLUORIDE 5000 PPM 1.1 % PSTE See admin instructions.   traMADol  (ULTRAM ) 50 MG tablet Take 1 tablet (50 mg total) by mouth every 12 (twelve) hours as needed.   zoledronic  acid (RECLAST ) 5 MG/100ML SOLN injection Inject 5 mg into the vein once. Yearly infusion   zolpidem  (AMBIEN ) 5 MG tablet TAKE 1/2 TO 1 TABLET BY MOUTH NIGHTLY AS NEEDED FOR SLEEP   [DISCONTINUED] azelastine (ASTELIN) 0.1 % nasal spray Place 1 spray into both nostrils 2 (two) times daily. (Patient not taking: Reported on 02/13/2023)   Facility-Administered Encounter Medications as of 03/12/2023  Medication   lidocaine  (XYLOCAINE ) 1 % (with pres) injection 10 mL   lidocaine  HCl (PF) (XYLOCAINE ) 2 % injection 5 mL  :   Review of Systems:  Out of a complete 14 point review of systems, all are reviewed and  negative with the exception of these symptoms as listed below:   Review of Systems  Neurological:        Internal / loud snoring, non-restorative sleep, questioning OSA / RUDD, STEPHEN M - No prior SS.  ESS  4 FSS 25.     Objective:  Neurological Exam  Physical Exam Physical Examination:   Vitals:   03/12/23 1243  BP: 126/70  Pulse: (!) 58  SpO2: 98%    General Examination: The patient is a very pleasant 73 y.o. female in no acute distress. She appears well-developed and well-nourished and well groomed.   HEENT: Normocephalic, atraumatic, pupils are equal, round and reactive to light, extraocular tracking is good without limitation to gaze excursion or nystagmus noted. Hearing is grossly intact. Face is symmetric with normal facial animation. Speech is clear with no dysarthria noted. There is no hypophonia. There is no lip, neck/head, jaw or voice tremor. Neck is supple with full range of passive and active motion. There are no carotid bruits on auscultation. Oropharynx exam reveals: moderate mouth dryness, adequate dental hygiene and mild airway crowding, due to small airway entry and redundant soft palate, Mallampati class II, tonsils on the smaller side, tongue protrudes centrally and palate elevates symmetrically, neck circumference 13 inches, minimal overbite noted.  Chest: Clear to auscultation without wheezing, rhonchi or crackles noted.  Heart: S1+S2+0, regular and normal without murmurs, rubs or gallops noted.   Abdomen: Soft, non-tender and non-distended.  Extremities: There is no pitting edema in the distal lower extremities bilaterally.   Skin: Warm and dry without trophic changes noted.   Musculoskeletal: exam reveals prominent arthritic changes in the hands.     Neurologically:  Mental status: The patient is awake, alert and oriented in all 4 spheres. Her immediate and remote memory, attention, language skills and fund of knowledge are appropriate. There is no  evidence of aphasia, agnosia, apraxia or anomia. Speech is clear with normal prosody and enunciation. Thought process is linear. Mood is normal and affect is normal.  Cranial nerves II -  XII are as described above under HEENT exam.  Motor exam: Normal bulk, strength and tone is noted. There is no obvious action or resting tremor.  Fine motor skills and coordination: grossly intact.  Cerebellar testing: No dysmetria or intention tremor. There is no truncal or gait ataxia.  Sensory exam: intact to light touch in the upper and lower extremities.  Gait, station and balance: She stands easily. No veering to one side is noted. No leaning to one side is noted. Posture is age-appropriate and stance is narrow based. Gait shows normal stride length and normal pace. No problems turning are noted.   Assessment and Plan:  In summary, Kathleen [name] is a very pleasant 73 y.o.-year old female with an underlying medical history of allergies, hypothyroidism, reflux disease, irritable bowel syndrome, tinnitus, hypertension, spondylosis of the lumbar spine with chronic low back pain, Sjogren's syndrome, osteoporosis and osteoarthritis, whose history and physical exam are concerning for sleep disordered breathing, particularly obstructive sleep apnea (OSA). A laboratory attended sleep study is typically considered gold standard for evaluation of sleep disordered breathing.   I had a long chat with the patient about my findings and the diagnosis of sleep apnea, particularly OSA, its prognosis and treatment options. We talked about medical/conservative treatments, surgical interventions and non-pharmacological approaches for symptom control. I explained, in particular, the risks and ramifications of untreated moderate to severe OSA, especially with respect to developing cardiovascular disease down the road, including congestive heart failure (CHF), difficult to treat hypertension, cardiac arrhythmias (particularly A-fib),  neurovascular complications including TIA, stroke and dementia. Even type 2 diabetes has, in part, been linked to untreated OSA. Symptoms of untreated OSA may include (but may not be limited to) daytime sleepiness, nocturia (i.e. frequent nighttime urination), memory problems, mood irritability and suboptimally controlled or worsening mood disorder such as depression and/or anxiety, lack of energy, lack of motivation, physical discomfort, as well as recurrent headaches, especially morning or nocturnal headaches. We talked about the importance of maintaining a healthy lifestyle and striving for healthy weight.  In addition, we talked about the importance of striving for and maintaining good sleep hygiene. I recommended a sleep study at this time. I outlined the differences between a laboratory attended sleep study which is considered more comprehensive and accurate over the option of a home sleep test (HST); the latter may lead to underestimation of sleep disordered breathing in some instances and does not help with diagnosing upper airway resistance syndrome and is not accurate enough to diagnose primary central sleep apnea typically. I outlined possible surgical and non-surgical treatment options of OSA, including the use of a positive airway pressure (PAP) device (i.e. CPAP, AutoPAP/APAP or BiPAP in certain circumstances), a custom-made dental device (aka oral appliance, which would require a referral to a specialist dentist or orthodontist typically, and is generally speaking not considered for patients with full dentures or edentulous state), upper airway surgical options, such as traditional UPPP (which is not considered a first-line treatment) or the Inspire device (hypoglossal nerve stimulator, which would involve a referral for consultation with an ENT surgeon, after careful selection, following inclusion criteria - also not first-line treatment). I explained the PAP treatment option to the patient in  detail, as this is generally considered first-line treatment.  The patient indicated that she would be willing to try PAP therapy, if the need arises. I explained the importance of being compliant with PAP treatment, not only for insurance purposes but primarily to improve patient's symptoms symptoms, and for the  patient's long term health benefit, including to reduce Her cardiovascular risks longer-term.    We will pick up our discussion about the next steps and treatment options after testing.  We will keep her posted as to the test results by phone call and/or MyChart messaging where possible.  We will plan to follow-up in sleep clinic accordingly as well.  I answered all her questions today and the patient was in agreement.   I encouraged her to call with any interim questions, concerns, problems or updates or email us  through MyChart.  Generally speaking, sleep test authorizations may take up to 2 weeks, sometimes less, sometimes longer, the patient is encouraged to get in touch with us  if they do not hear back from the sleep lab staff directly within the next 2 weeks.  Thank you very much for allowing me to participate in the care of this nice patient. If I can be of any further assistance to you please do not hesitate to call me at [phone].  Sincerely,   True Mar, MD, PhD

## 2023-03-15 ENCOUNTER — Telehealth: Payer: Self-pay | Admitting: Neurology

## 2023-03-15 NOTE — Telephone Encounter (Signed)
Sent mychart

## 2023-03-21 NOTE — Telephone Encounter (Signed)
I called the patient I was unable to get a hold of her I left her a voicemail informing her I needed the provider phone number on the back of her [facility] card to be able to schedule for the PA process. I did leave my direct number.

## 2023-03-26 NOTE — Telephone Encounter (Signed)
I spoke with the patient  [employer] Medicare/UHC GE[place] No auth req b/c medicare is primary ref # [place] on 03/26/23   Patient is scheduled at [facility] for 04/16/23 at 8 pm.  Mailed packet to the patient.

## 2023-03-29 NOTE — Progress Notes (Signed)
 Office Visit Note  Patient: Kathleen [name]             Date of Birth: April 12, 1950           MRN: [id number]             PCP: Loyola Mast, MD Referring: Loyola Mast, MD Visit Date: 04/12/2023   Subjective:  Follow-up (Patient states she has entered a Sjogren's clinical trial/research study. )   Discussed the use of AI scribe software for clinical note transcription with the patient, who gave verbal consent to proceed.  History of Present Illness   Kobe Jansma is a 73 y.o. female here for follow up for Sjogren's syndrome on hydroxychloroquine 200 mg daily and cevimeline 30 mg 3 times daily.   She experiences moderate symptoms from Sjogren's syndrome, with significant dryness in her mouth and eyes. The mouth dryness causes discomfort against her teeth. She uses Plaquenil (hydroxychloroquine) and sedumiline at full dose, along with Restasis eye drops twice daily. She previously tried a nasal spray, Tylosin, but found it ineffective and uncomfortable. She maintains a humidified environment at home to help manage her symptoms. No sores or ulcers in the mouth or nose are present.  She has ongoing hand arthritis, particularly in the triscaphe region, with persistent swelling and occasional difficulty with rings fitting. She has been prescribed tramadol for pain management, primarily for her back issues, and has not noticed significant changes in joint swelling. 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 Her hands are always swollen, though she does not track specific episodes of increased swelling.  Chronic back pain is present, particularly in the sacroiliac and lumbar regions, which has worsened over time. She has previously undergone radiofrequency ablation treatments and is considering further interventions before an upcoming cruise in May.  Recently, she experienced irritation in the inner corner of  her left eye, which has improved over the past week. No major illnesses such as upper respiratory infections, COVID, or flu have been reported recently.    Previous HPI 10/10/2022 Jerrianne Hartin is a 73 y.o. female here for follow up for Sjogren's syndrome on hydroxychloroquine 200 mg daily and cevimeline 30 mg 3 times daily.  She had some benefit in chronic low back pain with nerve ablation and working with physical therapy on this plus some related gluteal muscle pain or tendinopathy.  Currently having worse trouble with joint pain in her fingers mostly on the right hand and left wrist.  Worst at the second and fourth digit and getting some skin irritation between the fingers. Not seeing much visible swelling or discoloration.     Previous HPI 04/11/2022 Trulee Hamstra is a 73 y.o. female here for follow up for Sjogren's syndrome on hydroxychloroquine 200 mg daily and cevimeline 30 mg 3 times daily.  Currently taking meloxicam 15 mg daily consistently worst area of pain currently in the back but also getting a little bit worse in her hands compared to last visit.  She is scheduled for lumbar spine radiofrequency ablation on the 20th.  On Systane and Restasis eyedrops.  She feels like her concentration and brain fog is also slightly worse than usual in the past month or 2.   Previous HPI 10/05/21 Keagan Anthis is a 73 y.o. female here for follow up for sjogren's syndrome on HCQ 200 mg daily. She is taking cevimeline 30 mg TID for dry mouth and using restasis and lubricating eye drops  for dry eyes. She started using the Tyrvaya in the past 2 weeks so far not sure about the amount of difference. Eye dryness has possibly been a bit increased recently. She has pain and stiffness in her hands is bothersome as well. She takes meloxicam gabapentin and duloxetine largely for back pain but also som help for hands. She has not seen much difference when try topical diclofenac. Saw Dr. Amanda Pea for evaluation of  options with a finger cyst and her finger deformities, but currently holding off on any aggressive steps.   Previous HPI 04/07/2021 Jeilyn Reznik is a 73 y.o. female here for sjogren's syndrome on hydroxycloroquine 200 mg daily and cevimeline TID for dry mouth. She previously saw Dr. Ancil Linsey for this problem.  She was originally diagnosed after establishing care with him in 2016 with existing problems of chronic arthritis affecting facet joints knees and hands.  Work-up revealed a positive ANA with significant eye and mouth dryness symptoms no specific extractable nuclear antibodies identified.  She was not sure since a specific time point her symptoms developed somewhat progressive over preceding years.  She was started on hydroxychloroquine with partial symptom improvement as well as continuing topical diclofenac, oral meloxicam, gabapentin, and Cymbalta for chronic degenerative joint pain especially from the back.  Symptoms in her fingers and knees are persistent but not severely limiting of activity.  She has noticed some progression of the finger deformities over the past several years but usually without acute swelling redness or warmth. For her dry mouth symptoms she uses a Biotene spray drinks frequently and xylitol oral lozenges.  She takes cevimeline 3 times daily with a good benefit and no significant intolerance.  She denies any pain or swelling around major salivary glands or cervical lymphadenopathy. For dry eyes she uses Systane drops also using Restasis drops in both eyes twice daily with a good benefit.  She never experiences any periorbital swelling has no previous major abrasions or other complications.  She has regular follow-up with her eye Dr. Billie Ruddy at [facility] most recent in August 2022 with normal OCT testing and annual follow-up.  She most recently was started on a trial of Tyrvaya nasal spray she is not sure if there was a large change in symptoms but also did not continue the  medicine for a long time due to lack of follow-up and finishing the sample amount.     Labs reviewed 06/2017 SSA neg SSB neg   12/2014 ANA 1:160 homogenous     Review of Systems  Constitutional:  Positive for fatigue.  HENT:  Positive for mouth sores and mouth dryness.   Eyes:  Positive for dryness.  Respiratory:  Negative for shortness of breath.   Cardiovascular:  Negative for chest pain and palpitations.  Gastrointestinal:  Positive for constipation. Negative for blood in stool and diarrhea.  Endocrine: Negative for increased urination.  Genitourinary:  Negative for involuntary urination.  Musculoskeletal:  Positive for joint pain, joint pain and morning stiffness. Negative for gait problem, joint swelling, myalgias, muscle weakness, muscle tenderness and myalgias.  Skin:  Negative for color change, rash, hair loss and sensitivity to sunlight.  Allergic/Immunologic: Negative for susceptible to infections.  Neurological:  Negative for dizziness and headaches.  Hematological:  Negative for swollen glands.  Psychiatric/Behavioral:  Positive for sleep disturbance. Negative for depressed mood. The patient is nervous/anxious.     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  Ganglion cyst of finger of left hand 07/03/2022   Family history of serrated polyposis syndrome (SPS)- Sister 11/01/2021   Dry eye syndrome of both eyes 10/18/2021   Sjogren's syndrome (HCC) 04/07/2021   Osteoarthritis of hands, bilateral 04/07/2021   Essential hypertension 02/27/2021   Chronic right sacroiliac pain 09/09/2020   Osteoporosis 11/29/2018   Piriformis syndrome of right side 10/23/2017   Spondylosis without myelopathy or radiculopathy, 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   Chronic myofascial pain 04/02/2017   ANA positive 01/25/2017   High risk medication use 05/30/2016   Xerostomia 05/03/2015   GERD (gastroesophageal reflux disease)  04/01/2015   IBS (irritable bowel syndrome) 04/01/2015   Insomnia 04/01/2015   Chronic seasonal allergic rhinitis due to pollen 02/04/2015   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   Tinnitus 02/04/2015   Multifactorial low back pain 12/15/2014   Personal history of other malignant neoplasm of skin 12/08/2011   Chronic back pain 08/24/2011   Hypothyroidism 02/28/1995    Past Medical History:  Diagnosis Date   Acid reflux    Hypothyroid    Osteoarthritis    Sjogren's syndrome (HCC)    Spondylosis of lumbar spine     Family History  Problem Relation Age of Onset   Stroke Mother    Heart disease Mother    Osteoarthritis Mother    Heart disease Father    Hypertension Father    Heart disease Paternal Uncle    Cancer Paternal Uncle        Lung   Past Surgical History:  Procedure Laterality Date   CATARACT EXTRACTION, BILATERAL     KNEE ARTHROSCOPY W/ MENISCECTOMY Left    Social History   Social History Narrative   Caffiene rare.   Working retired.     Immunization History  Administered Date(s) Administered   Fluad Quad(high Dose 65+) 12/04/2018   Influenza Split 11/27/2020, 12/06/2021, 12/15/2022   Influenza, High Dose Seasonal PF 12/24/2017   PFIZER Comirnaty(Gray Top)Covid-19 Tri-Sucrose Vaccine 03/24/2019, 04/14/2019, 12/15/2019, 06/14/2020   Pfizer Covid-19 Vaccine Bivalent Booster 61yrs & up 12/09/2020, 12/06/2021, 12/15/2022   Pneumococcal Conjugate-13 11/29/2018   Pneumococcal Polysaccharide-23 08/12/2020   Tdap 03/26/2008, 10/07/2018   Zoster Recombinant(Shingrix) 11/26/2017, 02/05/2018   Zoster, Live 11/23/2011, 12/14/2011     Objective: Vital Signs: BP 125/74 (BP Location: Left Arm, Patient Position: Sitting, Cuff Size: Normal)   Pulse 64   Resp 14   Ht 5\' 3"  (1.6 m)   Wt 112 lb 12.8 oz (51.2 kg)   BMI 19.98 kg/m    Physical Exam HENT:     Mouth/Throat:     Mouth: Mucous membranes are dry.     Pharynx: Oropharynx is clear.  Eyes:      Conjunctiva/sclera: Conjunctivae normal.  Cardiovascular:     Rate and Rhythm: Normal rate and regular rhythm.  Pulmonary:     Effort: Pulmonary effort is normal.     Breath sounds: Normal breath sounds.  Lymphadenopathy:     Cervical: No cervical adenopathy.  Skin:    General: Skin is warm and dry.     Findings: No rash.  Neurological:     Mental Status: She is alert.  Psychiatric:        Mood and Affect: Mood normal.      Musculoskeletal Exam:  Shoulders full ROM no tenderness or swelling Elbows full ROM no tenderness or swelling Wrists full ROM no swelling Fingers with heberdon's nodes on both hands, right hand 2nd 4th PIP chronic widening, lateral deviation  Knees full ROM no tenderness or swelling  Investigation: No additional findings.  Imaging: Nocturnal polysomnography Result Date: 04/16/2023 Huston Foley, MD     04/20/2023  1:16 PM Physician Interpretation:  [facility] POLYSOMNOGRAPHY  INTERPRETATION REPORT STUDY DATE:  04/16/2023  PATIENT NAME:  Kathleen [name]        DATE OF BIRTH:  Feb 24, 1951 PATIENT ID:  [id number]    TYPE OF STUDY:  PSG READING PHYSICIAN: Huston Foley, MD, PhD   SCORING TECHNICIAN: Margaretann Loveless, RPSGT Referred by: Loyola Mast, MD ? History and Indication for Testing: 73 year old female with an underlying medical history of allergies, hypothyroidism, reflux disease, irritable bowel syndrome, tinnitus, hypertension, spondylosis of the lumbar spine with chronic low back pain, Sjogren's syndrome, osteoporosis and osteoarthritis, who reports snoring and sleep disruption, as well as significant nocturia. Her husband has noted pauses in her breathing or gasping sounds while she is asleep. Her Epworth sleepiness score is 4 out of 24, fatigue severity score is 25 out of 63.  Height: 63 in Weight: 114 lb (BMI 20) Neck Size: 13 in  MEDICATIONS: Tylenol, Alpha-Lipoic Acid, Norvasc, Vitamin C, Budesonide, Calcium Carbonate, Evoxac,  Restasis, Valium, Diclofenac Sodium, Cymbalta, Allegra, Neurontin, Plaquenil, Synthroid, Melatonin, Mobic, Singulair, Fish Oil, Prilosec, Miralax, Systane Balance, Sodium Fluoride, Ultram, Reclast, Ambien  TECHNICAL DESCRIPTION: A registered sleep technologist was in attendance for the duration of the recording.  Data collection, scoring, video monitoring, and reporting were performed in compliance with the AASM Manual for the Scoring of Sleep and Associated Events; (Hypopnea is scored based on the criteria listed in Section VIII D. 1b in the AASM Manual V2.6 using a 4% oxygen desaturation rule or Hypopnea is scored based on the criteria listed in Section VIII D. 1a in the AASM Manual V2.6 using 3% oxygen desaturation and /or arousal rule). SLEEP CONTINUITY AND SLEEP ARCHITECTURE:  Lights-out was at 21:16: and lights-on at  05:16:, with a total recording time of 8 hours. Total sleep time ( TST) was 304.0 minutes with a decreased sleep efficiency at 63.3%. There was  2.6% REM sleep. BODY POSITION:  TST was divided  between the following sleep positions: 21.2% supine;  78.8% lateral;  0% prone. Duration of total sleep and percent of total sleep in their respective position is as follows: supine 64 minutes (21%), non-supine 240 minutes (79%); right 178 minutes (59%), left 61 minutes (20%), and prone 00 minutes (0%). Total supine REM sleep time was 00 minutes (0% of total REM sleep).  Sleep latency was increased at 69.0 minutes.  REM sleep latency was markedly increased at 403.0 minutes. Of the total sleep time, the percentage of stage N1 sleep was 16.9%, which is markedly increased, stage N2 sleep was 75%, which is markedly increased, stage N3 sleep was 5.8%, which is reduced, and REM sleep was 2.6%, which is markedly reduced. Wake after sleep onset (WASO) time accounted for 107 minutes with mild to moderate sleep fragmentation noted. RESPIRATORY MONITORING:  Based on CMS criteria (using a 4% oxygen desaturation rule  for scoring hypopneas), there were 22 apneas (22 obstructive; 0 central; 0 mixed), and 2 hypopneas.  Apnea index was 4.3. Hypopnea index was 0.4. The apnea-hypopnea index was 4.7/hour overall (20.5 supine, 0 non-supine; 0.0 REM, 0.0 supine REM).  There were 0 respiratory effort-related arousals (RERAs).  The RERA index was 0 events/h. Total respiratory disturbance index (RDI) was 4.7 events/h. RDI results showed: supine RDI  20.5 /h; non-supine RDI 0.5 /h; REM RDI  0.0 /h, supine REM RDI 0.0 /h. Based on AASM criteria (using a 3% oxygen desaturation and /or arousal rule for scoring hypopneas), there were 22 apneas (22 obstructive; 0 central; 0 mixed), and 2 hypopneas. Apnea index was 4.3. Hypopnea index was 0.4. The apnea-hypopnea index was 4.7 overall (20.5 supine, 0 non-supine; 0.0 REM, 0.0 supine REM).  There were 0 respiratory effort-related arousals (RERAs).  The RERA index was 0 events/h. Total respiratory disturbance index (RDI) was 4.7 events/h. RDI results showed: supine RDI  20.5 /h; non-supine RDI 0.5 /h; REM RDI 0.0 /h, supine REM RDI 0.0 /h.  OXIMETRY: Oxyhemoglobin Saturation Nadir during sleep was at 92% from a mean of 95%.  Of the Total sleep time (TST)   hypoxemia (=<88%) was present for  1.5 minutes, or 0.5% of total sleep time. LIMB MOVEMENTS: There were 8 periodic limb movements of sleep (1.6/hr), of which 0 (0.0/hr) were associated with an arousal.  AROUSAL: There were 100 arousals in total, for an arousal index of 20 arousals/hour.  Of these, 17 were identified as respiratory-related arousals (3 /h), 0 were PLM-related arousals (0 /h), and 100 were non-specific arousals (20 /h).  EEG: Review of the EEG showed no abnormal electrical discharges and symmetrical bihemispheric findings.  EKG: The EKG revealed normal sinus rhythm (NSR). The average heart rate during sleep was 59 bpm. AUDIO/VIDEO REVIEW: The audio and video review did not show any abnormal or unusual behaviors, movements, phonations or  vocalizations. The patient took 1 restroom breaks. Snoring was noted intermittently, in the mild range. POST-STUDY QUESTIONNAIRE: Post study, the patient indicated, that sleep was worse than usual. IMPRESSION: 1. Primary Snoring 2. Dysfunctions associated with sleep stages or arousal from sleep RECOMMENDATIONS: 1. This study does not demonstrate any significant obstructive or central sleep disordered breathing with an AHI of less than 5/hour - her total AHI was 4.7/h, O2 nadir 92%. The reduced sleep efficiency and reduced percentage of REM sleep may have led to an underestimation of her sleep disordered breathing. Mild intermittent snoring was noted during the study. Treatment with a positive airway pressure device, such as CPAP or autoPAP is not indicated.  Avoiding the supine sleep position may aid in reducing her snoring. 2. This study shows sleep fragmentation and abnormal sleep stage percentages; these are nonspecific findings and per se do not signify an intrinsic sleep disorder or a cause for the patient's sleep-related symptoms. Causes include (but are not limited to) the first night effect of the sleep study, circadian rhythm disturbances, medication effect or an underlying mood disorder or medical problem. 2. The patient should be cautioned not to drive, work at heights, or operate dangerous or heavy equipment when tired or sleepy. Review and reiteration of good sleep hygiene measures should be pursued with any patient. 3. The patient will be advised to follow up with the referring provider, who will be notified of the test results. I certify that I have reviewed the entire raw data recording prior to the issuance of this report in accordance with the Standards of Accreditation of the American Academy of Sleep Medicine (AASM). Huston Foley, MD, PhD Medical Director, [facility]) Diplomat, ABPN (Neurology and Sleep)    Technical Report: General Information Name:  Diandra, Cimini BMI: 20.19 Physician: Huston Foley, MD ID: [id number] Height: 63.0 in Technician: Margaretann Loveless, RPSGT Sex: Female Weight: 114.0 lb Record: [id number] Age: 51 [03-09-1950] Date: 04/16/2023   Medical & Medication History   73 year old female with an  underlying medical history of allergies, hypothyroidism, reflux disease, irritable bowel syndrome, tinnitus, hypertension, spondylosis of the lumbar spine with chronic low back pain, Sjogren's syndrome, osteoporosis and osteoarthritis, who reports snoring and sleep disruption, as well as significant nocturia. Her husband has noted pauses in her breathing or gasping sounds while she is asleep. Tylenol, Alpha-Lipoic Acid, Norvasc, Vitamin C, Budesonide, Calcium Carbonate, Evoxac, Restasis, Valium, Diclofenac Sodium, Cymbalta, Allegra, Neurontin, Plaquenil, Synthroid, Melatonin, Mobic, Singulair, Fish Oil, Prilosec, Miralax, Systane Balance, Sodium Fluoride, Ultram, Reclast, Ambien  Sleep Disorder    Comments  The patient came into the sleep lab for a PSG. The patient took Ambien prior to sleep study. Per the patient she did not take Melatonin. She took Gabapentin and Tramadol prior to arrival. One restroom break. EKG did not show any obvious cardiac arrhythmias. Mild snoring. Respiratory events scored with a 4% desat. Majority of respiratory events were while supine. The patient slept supine and lateral. AHI was 4.1 after 2 hrs of TST. No REM sleep. The patient had fragmented sleep.   Lights out: 09:16:04 PM Lights on: 05:16:18 AM Time Total Supine Side Prone Upright Recording (TRT) 8h 0.66m 2h 21.27m 5h 38.29m 0h 0.16m 0h 0.42m Sleep (TST) 5h 4.44m 1h 4.42m 3h 59.69m 0h 0.71m 0h 0.89m Latency N1 N2 N3 REM Onset Per. Slp. Eff. Actual 0h 0.48m 0h 3.42m 1h 45.60m 6h 43.1m 1h 9.60m 1h 26.49m 63.33% Stg Dur Wake N1 N2 N3 REM Total 176.0 51.5 227.0 17.5 8.0 Supine 77.0 25.5 39.0 0.0 0.0 Side 99.0 26.0 188.0 17.5 8.0 Prone 0.0 0.0 0.0 0.0 0.0 Upright 0.0 0.0 0.0 0.0 0.0  Stg  % Wake N1 N2 N3 REM Total 36.7 16.9 74.7 5.8 2.6 Supine 16.0 8.4 12.8 0.0 0.0 Side 20.6 8.6 61.8 5.8 2.6 Prone 0.0 0.0 0.0 0.0 0.0 Upright 0.0 0.0 0.0 0.0 0.0  Apnea Summary Sub Supine Side Prone Upright Total 22 Total 22 21 1  0 0   REM 0 0 0 0 0   NREM 22 21 1  0 0 Obs 22 REM 0 0 0 0 0   NREM 22 21 1  0 0 Mix 0 REM 0 0 0 0 0   NREM 0 0 0 0 0 Cen 0 REM 0 0 0 0 0   NREM 0 0 0 0 0 Rera Summary Sub Supine Side Prone Upright Total 0 Total 0 0 0 0 0   REM 0 0 0 0 0   NREM 0 0 0 0 0  Hypopnea Summary Sub Supine Side Prone Upright Total 2 Total 2 1 1  0 0   REM 0 0 0 0 0   NREM 2 1 1  0 0 4% Hypopnea Summary Sub Supine Side Prone Upright Total (4%) 2 Total 2 1 1  0 0   REM 0 0 0 0 0   NREM 2 1 1  0 0  AHI Total Obs Mix Cen 4.74 Apnea 4.34 4.34 0.00 0.00  Hypopnea 0.39 -- -- -- 4.74 Hypopnea (4%) 0.39 -- -- --  Total Supine Side Prone Upright Position AHI 4.74 20.47 0.50 0.00 0.00 REM AHI 0.00  NREM AHI 4.86  Position RDI 4.74 20.47 0.50 0.00 0.00 REM RDI 0.00  NREM RDI 4.86  4% Hypopnea Total Supine Side Prone Upright Position AHI (4%) 4.74 20.47 0.50 0.00 0.00 REM AHI (4%) 0.00  NREM AHI (4%) 4.86  Position RDI (4%) 4.74 20.47 0.50 0.00 0.00 REM RDI (4%) 0.00  NREM RDI (4%) 4.86  Desaturation Information Threshold:  2% <100% <90% <80% <70% <60% <50% <40% Supine 34.0 0.0 0.0 0.0 0.0 0.0 0.0 Side 33.0 1.0 0.0 0.0 0.0 0.0 0.0 Prone 0.0 0.0 0.0 0.0 0.0 0.0 0.0 Upright 0.0 0.0 0.0 0.0 0.0 0.0 0.0 Total 67.0 1.0 0.0 0.0 0.0 0.0 0.0 Index 9.8 0.1 0.0 0.0 0.0 0.0 0.0 Threshold: 3% <100% <90% <80% <70% <60% <50% <40% Supine 18.0 0.0 0.0 0.0 0.0 0.0 0.0 Side 8.0 1.0 0.0 0.0 0.0 0.0 0.0 Prone 0.0 0.0 0.0 0.0 0.0 0.0 0.0 Upright 0.0 0.0 0.0 0.0 0.0 0.0 0.0 Total 26.0 1.0 0.0 0.0 0.0 0.0 0.0 Index 3.8 0.1 0.0 0.0 0.0 0.0 0.0 Threshold: 4% <100% <90% <80% <70% <60% <50% <40% Supine 4.0 0.0 0.0 0.0 0.0 0.0 0.0 Side 5.0 1.0 0.0 0.0 0.0 0.0 0.0 Prone 0.0 0.0 0.0 0.0 0.0 0.0 0.0 Upright 0.0 0.0 0.0 0.0 0.0 0.0 0.0 Total 9.0 1.0 0.0 0.0 0.0 0.0  0.0 Index 1.3 0.1 0.0 0.0 0.0 0.0 0.0 Threshold: 3% <100% <90% <80% <70% <60% <50% <40% Supine 18 0 0 0 0 0 0 Side 8 1 0 0 0 0 0 Prone 0 0 0 0 0 0 0 Upright 0 0 0 0 0 0 0 Total 26 1 0 0 0 0 0  Awakening/Arousal Information # of Awakenings 33 Wake after sleep onset 107.24m Wake after persistent sleep 105.10m Arousal Assoc. Arousals Index Apneas 16 3.2 Hypopneas 1 0.2 Leg Movements 2 0.4 Snore 0 0.0 PTT Arousals 0 0.0 Spontaneous 100 19.7 Total 119 23.5 Leg Movement Information PLMS LMs Index Total LMs during PLMS 8 1.6 LMs w/ Microarousals 0 0.0 LM LMs Index w/ Microarousal 2 0.4 w/ Awakening 0 0.0 w/ Resp Event 0 0.0 Spontaneous 6 1.2 Total 8 1.6  Desaturation threshold setting: 3% Minimum desaturation setting: 10 seconds SaO2 nadir: 87% The longest event was a 28 sec obstructive Hypopnea with a minimum SaO2 of 92%. The lowest SaO2 was 92% associated with a 24 sec obstructive Apnea. EKG Rates EKG Avg Max Min Awake 61 88 55 Asleep 59 72 54 EKG Events: N/A    Recent Labs: Lab Results  Component Value Date   WBC 6.0 04/12/2023   HGB 13.1 04/12/2023   PLT 370 04/12/2023   NA 132 (L) 04/12/2023   K 4.4 04/12/2023   CL 96 (L) 04/12/2023   CO2 20 04/12/2023   GLUCOSE 83 04/12/2023   BUN 19 04/12/2023   CREATININE 0.82 04/12/2023   BILITOT 0.4 10/05/2021   ALKPHOS 51 05/05/2016   AST 20 10/05/2021   ALT 16 10/05/2021   PROT 6.6 10/05/2021   ALBUMIN 4.5 05/05/2016   CALCIUM 10.2 04/12/2023    Speciality Comments: PLQ EYE EXAM 03/13/2022 normal [employer] [facility] f/u 1 year  Procedures:  No procedures performed Allergies: Clindamycin/lincomycin   Assessment / Plan:     Visit Diagnoses: Sjogren's syndrome 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 No  aggressive organ system involvement. Dry mouth and eyes, with recent irritation in the left eye. Joint problems present, particularly in the hands. Currently on Plaquenil and Cevimeline. -Continue hydroxychloroquine 200 mg daily -Continue cevimeline 30 mg 3 times daily  Erosive osteoarthritis of the hands Noted in hands, particularly right hand. Pain and swelling present, but not severe. No current treatment specifically for arthritis.  High risk medication use - hydroxychloroquine 200 mg daily. PLQ EYE EXAM 03/13/2022 normal. Needs updated PLQ eye exam. - Plan: CBC with Differential/Platelet, BASIC METABOLIC PANEL WITH GFR Tolerating medication without new issue or complaints.  No serious interval infections.  Will be due for new ophthalmology retinal screening exam last was normal January 2024. -Checking CBC and BMP for medication monitoring on long-term continue hydroxychloroquine and cevimeline  Dry eye syndrome of both eyes On Restasis twice daily. Recent irritation in left eye, now resolved. -Continue Restasis 0.05% BID and systane drops PRN  Multifactorial low back pain - On gabapentin and duloxetine maintenance. Chronic issue, possibly requiring intervention. Patient to discuss potential injection therapy with pain management specialist.    Orders: Orders Placed This Encounter  Procedures   Sedimentation rate   CBC with Differential/Platelet   BASIC METABOLIC PANEL WITH GFR   C3 and C4   Meds ordered this encounter  Medications   cycloSPORINE (RESTASIS) 0.05 % ophthalmic emulsion    Sig: Place 1 drop into both eyes 2 (two) times daily.    Dispense:  90 each    Refill:  1   cevimeline (EVOXAC) 30 MG capsule    Sig: Take 1 capsule (30 mg total) by mouth 3 (three) times daily.    Dispense:  270 capsule    Refill:  1   hydroxychloroquine (PLAQUENIL) 200 MG tablet    Sig: Take 1 tablet (200 mg total) by mouth daily.    Dispense:  90 tablet    Refill:  0     Follow-Up  Instructions: Return in about 6 months (around 10/10/2023) for pSS/EOH f/u 6mos.   Fuller Plan, MD  Note - This record has been created using [employer].  Chart creation errors have been sought, but may not always  have been located. Such creation errors do not reflect on  the standard of medical care.

## 2023-04-06 ENCOUNTER — Encounter: Payer: Self-pay | Admitting: Neurology

## 2023-04-10 ENCOUNTER — Ambulatory Visit: Payer: Medicare Other | Admitting: Physical Medicine & Rehabilitation

## 2023-04-12 ENCOUNTER — Encounter
Payer: No Typology Code available for payment source | Attending: Physical Medicine & Rehabilitation | Admitting: Physical Medicine & Rehabilitation

## 2023-04-12 ENCOUNTER — Encounter: Payer: Self-pay | Admitting: Internal Medicine

## 2023-04-12 ENCOUNTER — Ambulatory Visit: Payer: Medicare Other | Attending: Internal Medicine | Admitting: Internal Medicine

## 2023-04-12 ENCOUNTER — Encounter: Payer: Self-pay | Admitting: Physical Medicine & Rehabilitation

## 2023-04-12 VITALS — BP 113/73 | HR 64 | Ht 63.0 in | Wt 114.0 lb

## 2023-04-12 VITALS — BP 125/74 | HR 64 | Resp 14 | Ht 63.0 in | Wt 112.8 lb

## 2023-04-12 DIAGNOSIS — M47816 Spondylosis without myelopathy or radiculopathy, lumbar region: Secondary | ICD-10-CM | POA: Diagnosis present

## 2023-04-12 DIAGNOSIS — M1990 Unspecified osteoarthritis, unspecified site: Secondary | ICD-10-CM | POA: Diagnosis present

## 2023-04-12 DIAGNOSIS — M3500 Sicca syndrome, unspecified: Secondary | ICD-10-CM | POA: Diagnosis present

## 2023-04-12 DIAGNOSIS — Z79899 Other long term (current) drug therapy: Secondary | ICD-10-CM | POA: Diagnosis present

## 2023-04-12 DIAGNOSIS — H04123 Dry eye syndrome of bilateral lacrimal glands: Secondary | ICD-10-CM | POA: Diagnosis present

## 2023-04-12 MED ORDER — CYCLOSPORINE 0.05 % OP EMUL: 1.0000 [drp] | Freq: Two times a day (BID) | OPHTHALMIC | 1 refills | Status: DC

## 2023-04-12 MED ORDER — HYDROXYCHLOROQUINE SULFATE 200 MG PO TABS: 200.0000 mg | ORAL_TABLET | Freq: Every day | ORAL | 0 refills | Status: DC

## 2023-04-12 MED ORDER — CEVIMELINE HCL 30 MG PO CAPS
30.0000 mg | ORAL_CAPSULE | Freq: Three times a day (TID) | ORAL | 1 refills | Status: DC
Start: 2023-04-12 — End: 2023-10-10

## 2023-04-12 NOTE — Progress Notes (Signed)
 Subjective:    Patient ID: Kathleen [name], female    DOB: 1950-12-02, 73 y.o.   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  She feels like her pain is mostly at the waist level more so than in the buttocks.  He has no pain traveling down her leg any numbness or tingling in the right lower extremity. The patient had her last lumbar radiofrequency neurotomy L3-L4-L5 right side March 2024.  She is having greater than 50% relief when I saw her in September 2024 .  The last right sacroiliac RF was performed in April 2024 Pain Inventory Average Pain 8 Pain Right Now 7 My pain is sharp and aching  In the last 24 hours, has pain interfered with the following? General activity 3 Relation with others 0 Enjoyment of life 3 What TIME of day is your pain at its worst? varies Sleep (in general) Fair  Pain is worse with: sitting, standing, and some movements Pain improves with: rest, heat/ice, therapy/exercise, and medication Relief from Meds: 7  Family History  Problem Relation Age of Onset   Stroke Mother    Heart disease Mother    Osteoarthritis Mother    Heart disease Father    Hypertension Father    Heart disease Paternal Uncle    Cancer Paternal Uncle        Lung   Social History   Socioeconomic History   Marital status: Married    Spouse name: Not on file   Number of children: 3   Years of education: Not on file   Highest education level: Bachelor's degree (e.g., BA, AB, BS)  Occupational History   Occupation: Retired  Tobacco Use   Smoking status: Never    Passive exposure: Never   Smokeless tobacco: Never  Vaping Use   Vaping status: Never Used  Substance and Sexual Activity   Alcohol use: Yes    Alcohol/week: 1.0 standard drink of alcohol    Types: 1 Cans of beer  per week    Comment: socially   Drug use: Never   Sexual activity: Yes  Other Topics Concern   Not on file  Social History Narrative   Caffiene rare.   Working retired.     Social Drivers of Corporate investment banker Strain: Low Risk  (02/13/2023)   Overall Financial Resource Strain (CARDIA)    Difficulty of Paying Living Expenses: Not hard at all  Food Insecurity: No Food Insecurity (02/13/2023)   Hunger Vital Sign    Worried About Running Out of Food in the Last Year: Never true    Ran Out of Food in the Last Year: Never true  Transportation Needs: No Transportation Needs (02/13/2023)   PRAPARE - Administrator, Civil Service (Medical): No    Lack of Transportation (Non-Medical): No  Physical Activity: Sufficiently Active (02/13/2023)   Exercise Vital Sign    Days of Exercise per Week: 5 days    Minutes of Exercise per Session: 30 min  Recent Concern: Physical Activity - Insufficiently Active (01/29/2023)   Exercise Vital Sign    Days of Exercise per Week: 5 days    Minutes of Exercise per Session: 20 min  Stress: No Stress Concern Present (02/13/2023)   [employer] of Occupational Health - Occupational Stress Questionnaire  Feeling of Stress : Only a little  Social Connections: Socially Integrated (02/13/2023)   Social Connection and Isolation Panel [NHANES]    Frequency of Communication with Friends and Family: More than three times a week    Frequency of Social Gatherings with Friends and Family: Once a week    Attends Religious Services: More than 4 times per year    Active Member of [employer] or Organizations: Yes    Attends Engineer, structural: More than 4 times per year    Marital Status: Married   Past Surgical History:  Procedure Laterality Date   CATARACT EXTRACTION, BILATERAL     KNEE ARTHROSCOPY W/ MENISCECTOMY Left    Past Surgical History:  Procedure Laterality Date   CATARACT EXTRACTION, BILATERAL     KNEE ARTHROSCOPY W/  MENISCECTOMY Left    Past Medical History:  Diagnosis Date   Acid reflux    Hypothyroid    Osteoarthritis    Sjogren's syndrome (HCC)    Spondylosis of lumbar spine    BP 113/73   Pulse 64   Ht 5\' 3"  (1.6 m)   Wt 114 lb (51.7 kg)   SpO2 94%   BMI 20.19 kg/m   Opioid Risk Score:   Fall Risk Score:  `1  Depression screen [facility] 2/9     10/03/2022   10:24 AM 09/18/2022    2:03 PM 06/22/2022   10:58 AM 05/16/2022   10:30 AM 01/17/2022   11:49 AM 12/08/2021   11:58 AM 11/29/2021   10:46 AM  Depression screen PHQ 2/9  Decreased Interest 0 0 0 0 0 0 0  Down, Depressed, Hopeless 0 0 0 0 0 0 0  PHQ - 2 Score 0 0 0 0 0 0 0  Altered sleeping  0       Tired, decreased energy  0       Change in appetite  0       Feeling bad or failure about yourself   0       Trouble concentrating  0       Moving slowly or fidgety/restless  0       Suicidal thoughts  0       PHQ-9 Score  0       Difficult doing work/chores  Not difficult at all          Review of Systems  Musculoskeletal:  Positive for back pain.  All other systems reviewed and are negative.     Objective:   Physical Exam  Gaenslens: Negative Sacral thrust (prone) : Positive Lateral compression: Negative FABER's: Positive Distraction (supine): Negative Thigh thrust test: Negative  Patient has pain in the right L4-L5 paraspinal region Negative straight leg raise Lower extremity strength is normal No tenderness over the gluteal region.     Assessment & Plan:   1.  Recurrent right lumbar pain in the anatomic distribution of the L4-5 and L5-S1 facet joints.  Will repeat right L3-L4 medial branch and L5 dorsal ramus radiofrequency neurotomy to adjust pain from these 2 joints.  She had greater than 50% relief for greater than 6 months. Will repeat next month

## 2023-04-13 LAB — CBC WITH DIFFERENTIAL/PLATELET
Absolute Lymphocytes: 1110 {cells}/uL (ref 850–3900)
Absolute Monocytes: 432 {cells}/uL (ref 200–950)
Basophils Absolute: 18 {cells}/uL (ref 0–200)
Basophils Relative: 0.3 %
Eosinophils Absolute: 42 {cells}/uL (ref 15–500)
Eosinophils Relative: 0.7 %
HCT: 40.2 % (ref 35.0–45.0)
Hemoglobin: 13.1 g/dL (ref 11.7–15.5)
MCH: 30 pg (ref 27.0–33.0)
MCHC: 32.6 g/dL (ref 32.0–36.0)
MCV: 92.2 fL (ref 80.0–100.0)
MPV: 9.6 fL (ref 7.5–12.5)
Monocytes Relative: 7.2 %
Neutro Abs: 4398 {cells}/uL (ref 1500–7800)
Neutrophils Relative %: 73.3 %
Platelets: 370 10*3/uL (ref 140–400)
RBC: 4.36 10*6/uL (ref 3.80–5.10)
RDW: 12.4 % (ref 11.0–15.0)
Total Lymphocyte: 18.5 %
WBC: 6 10*3/uL (ref 3.8–10.8)

## 2023-04-13 LAB — C3 AND C4
C3 Complement: 111 mg/dL (ref 83–193)
C4 Complement: 18 mg/dL (ref 15–57)

## 2023-04-13 LAB — BASIC METABOLIC PANEL WITH GFR
BUN: 19 mg/dL (ref 7–25)
CO2: 20 mmol/L (ref 20–32)
Calcium: 10.2 mg/dL (ref 8.6–10.4)
Chloride: 96 mmol/L — ABNORMAL LOW (ref 98–110)
Creat: 0.82 mg/dL (ref 0.60–1.00)
Glucose, Bld: 83 mg/dL (ref 65–99)
Potassium: 4.4 mmol/L (ref 3.5–5.3)
Sodium: 132 mmol/L — ABNORMAL LOW (ref 135–146)
eGFR: 77 mL/min/{1.73_m2} (ref >=60)

## 2023-04-13 LAB — SEDIMENTATION RATE: Sed Rate: 2 mm/h (ref 0–30)

## 2023-04-16 ENCOUNTER — Ambulatory Visit (INDEPENDENT_AMBULATORY_CARE_PROVIDER_SITE_OTHER): Payer: Medicare Other | Admitting: Neurology

## 2023-04-16 DIAGNOSIS — Z820 Family history of epilepsy and other diseases of the nervous system: Secondary | ICD-10-CM

## 2023-04-16 DIAGNOSIS — R0683 Snoring: Secondary | ICD-10-CM | POA: Diagnosis not present

## 2023-04-16 DIAGNOSIS — R0681 Apnea, not elsewhere classified: Secondary | ICD-10-CM

## 2023-04-16 DIAGNOSIS — Z9189 Other specified personal risk factors, not elsewhere classified: Secondary | ICD-10-CM

## 2023-04-16 DIAGNOSIS — G478 Other sleep disorders: Secondary | ICD-10-CM

## 2023-04-16 DIAGNOSIS — G4720 Circadian rhythm sleep disorder, unspecified type: Secondary | ICD-10-CM

## 2023-04-16 DIAGNOSIS — R351 Nocturia: Secondary | ICD-10-CM

## 2023-04-19 NOTE — Procedures (Unsigned)
 Physician Interpretation:    Referred by: Loyola Mast, MD    History and Indication for Testing: 73 year old female with an underlying medical history of allergies, hypothyroidism, reflux disease, irritable bowel syndrome, tinnitus, hypertension, spondylosis of the lumbar spine with chronic low back pain, Sjogren's syndrome, osteoporosis and osteoarthritis, who reports snoring and sleep disruption, as well as significant nocturia.  Her husband has noted pauses in her breathing or gasping sounds while she is asleep.  Her Epworth sleepiness score is 4 out of 24, fatigue severity score is 25 out of 63.    EEG: Review of the EEG showed no abnormal electrical discharges and symmetrical bihemispheric findings.     EKG: The EKG revealed normal sinus rhythm (NSR). ***   AUDIO/VIDEO REVIEW: The audio and video review did not show any abnormal or unusual behaviors, movements, phonations or vocalizations. The patient took 1 restroom breaks. Snoring was noted, in the mild.   POST-STUDY QUESTIONNAIRE: Post study, the patient indicated, that sleep was worse than usual.    IMPRESSION:   Primary Snoring Dysfunctions associated with sleep stages or arousal from sleep ***Non-specific abnormal electrocardiogram (EKG)   RECOMMENDATIONS:   This study does not demonstrate any significant obstructive or central sleep disordered breathing with an AHI of less than 5/hour - her total AHI was 4.7/h, O2 nadir 92%.  Mild intermittent snoring was d noted during the study. Treatment with a positive airway pressure device, such as CPAP or autoPAP is not indicated.  Avoiding the supine sleep position may aid in reducing her snoring.  2. This study shows sleep fragmentation and abnormal sleep stage percentages; these are nonspecific findings and per se do not signify an intrinsic sleep disorder or a cause for the patient's sleep-related symptoms. Causes include (but are not limited to) the first night effect of the  sleep study, circadian rhythm disturbances, medication effect or an underlying mood disorder or medical problem.  The patient should be cautioned not to drive, work at heights, or operate dangerous or heavy equipment when tired or sleepy. Review and reiteration of good sleep hygiene measures should be pursued with any patient. The patient will be advised to follow up with the referring provider, who will be notified of the test results.  I certify that I have reviewed the entire raw data recording prior to the issuance of this report in accordance with the Standards of Accreditation of the American Academy of Sleep Medicine (AASM).   Huston Foley, MD, PhD Medical Director, [facility]) Diplomat, ABPN (Neurology and Sleep)   Technical Report:   ***

## 2023-04-22 ENCOUNTER — Other Ambulatory Visit: Payer: Self-pay | Admitting: Physical Medicine & Rehabilitation

## 2023-04-24 ENCOUNTER — Telehealth: Payer: Self-pay | Admitting: *Deleted

## 2023-04-24 NOTE — Telephone Encounter (Signed)
-----   Message from Huston Foley sent at 04/20/2023  1:17 PM EST ----- Patient referred by PCP, seen by me on 03/12/23, patient had a diagnostic PSG on 04/16/23.   Please call and notify the patient that the recent sleep study did not show any significant sleep apnea. She achieved mostly light stage sleep, had trouble going to sleep and staying asleep, had intermittent mild snoring. Treatment with a positive airway pressure device, such as CPAP or autoPAP is not indicated.  Avoidance of the supine sleep position may reduce her snoring. At this juncture, she can follow up with her PCP and other providers as scheduled/planned.   Thanks,  Huston Foley, MD, PhD [facility])

## 2023-04-24 NOTE — Telephone Encounter (Signed)
 Spoke to patient gave sleep study results Gave pt Dr. Johny Sax recommendation. Pt states was very appreciative of her care here at [facility] . Pt expressed understanding and thanked me for calling

## 2023-04-25 ENCOUNTER — Encounter: Payer: Self-pay | Admitting: Internal Medicine

## 2023-04-27 ENCOUNTER — Encounter: Payer: Self-pay | Admitting: Family Medicine

## 2023-05-07 ENCOUNTER — Telehealth: Payer: Self-pay | Admitting: Physical Medicine & Rehabilitation

## 2023-05-07 NOTE — Telephone Encounter (Signed)
 Pt left vm on clinical line for a refill of tramadol at 10:05 am today. She stated her pharmacy [facility] [address] [place] gave her a 7 day dosage . She stated she goes out of town for 2 weeks in may and wants to make sure she has her medication before she leaves

## 2023-05-15 ENCOUNTER — Encounter: Payer: Self-pay | Admitting: Family Medicine

## 2023-05-15 ENCOUNTER — Ambulatory Visit (INDEPENDENT_AMBULATORY_CARE_PROVIDER_SITE_OTHER): Payer: Medicare Other | Admitting: Family Medicine

## 2023-05-15 VITALS — BP 120/68 | HR 68 | Temp 97.9°F | Ht 63.0 in | Wt 114.2 lb

## 2023-05-15 DIAGNOSIS — E782 Mixed hyperlipidemia: Secondary | ICD-10-CM

## 2023-05-15 DIAGNOSIS — E785 Hyperlipidemia, unspecified: Secondary | ICD-10-CM | POA: Insufficient documentation

## 2023-05-15 DIAGNOSIS — E039 Hypothyroidism, unspecified: Secondary | ICD-10-CM

## 2023-05-15 DIAGNOSIS — I10 Essential (primary) hypertension: Secondary | ICD-10-CM | POA: Diagnosis not present

## 2023-05-15 DIAGNOSIS — M3500 Sicca syndrome, unspecified: Secondary | ICD-10-CM | POA: Diagnosis not present

## 2023-05-15 DIAGNOSIS — Z7184 Encounter for health counseling related to travel: Secondary | ICD-10-CM

## 2023-05-15 MED ORDER — NIRMATRELVIR/RITONAVIR (PAXLOVID)TABLET: 3.0000 | ORAL_TABLET | Freq: Two times a day (BID) | ORAL | 0 refills | Status: AC

## 2023-05-15 NOTE — Assessment & Plan Note (Signed)
 Stable. Continue to follow with Dr. Dimple Casey.

## 2023-05-15 NOTE — Progress Notes (Signed)
 [facility] [address] [place] [address] Dept: [phone] Dept Fax: [phone]  Chronic Care Office Visit  Subjective:    Patient ID: Kathleen [name], female    DOB: 1950-07-09, 73 y.o..   MRN: [id number]  Chief Complaint  Patient presents with   Hypertension    3 month f/u.   Has questions about supplements, and wants to discuss meds for upcoming trip overseas.    History of Present Illness:  Patient is in today for reassessment of chronic medical issues.  Kathleen [name] has a history of  hypertension.  She is managed on amlodipine 5 mg daily.    Kathleen [name] has a history of hypothyroidism. She is managed on levothyroxine 75 mcg daily.   Kathleen [name] has a history of chronic low back pain due to a combination of arthritis, spondylosis, and sacroiliitis. She sees Dr. Wynn Banker (physiatry).  Kathleen [name] has a history of Sjogren's syndrome and inflammatory OA. She is managed on hydroxychloroquine and several medicines focused on dry eyes and dry mouth.    Kathleen [name] plans a cruise from [place], through the [address], ending in [place]. The crusie is planned for late May and early June.  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   Ganglion cyst of finger of left hand 07/03/2022   Family history of serrated polyposis syndrome (SPS)- Sister 11/01/2021   Dry eye syndrome of both eyes 10/18/2021   Sjogren's syndrome (HCC) 04/07/2021   Osteoarthritis of hands, bilateral 04/07/2021   Essential hypertension 02/27/2021   Chronic right sacroiliac pain 09/09/2020   Osteoporosis 11/29/2018   Piriformis syndrome of right side 10/23/2017   Spondylosis without myelopathy or radiculopathy, 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   Chronic myofascial pain 04/02/2017   ANA positive 01/25/2017   High risk medication use 05/30/2016    Xerostomia 05/03/2015   GERD (gastroesophageal reflux disease) 04/01/2015   IBS (irritable bowel syndrome) 04/01/2015   Insomnia 04/01/2015   Chronic seasonal allergic rhinitis due to pollen 02/04/2015   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   Tinnitus 02/04/2015   Multifactorial low back pain 12/15/2014   Personal history of other malignant neoplasm of skin 12/08/2011   Chronic back pain 08/24/2011   Hypothyroidism 02/28/1995   Past Surgical History:  Procedure Laterality Date   CATARACT EXTRACTION, BILATERAL     KNEE ARTHROSCOPY W/ MENISCECTOMY Left    Family History  Problem Relation Age of Onset   Stroke Mother    Heart disease Mother    Osteoarthritis Mother    Heart disease Father    Hypertension Father    Heart disease Paternal Uncle    Cancer Paternal Uncle        Lung   Outpatient Medications Prior to Visit  Medication Sig Dispense Refill   acetaminophen (TYLENOL) 325 MG tablet Take 650 mg by mouth every 6 (six) hours as needed.     Alpha-Lipoic Acid 300 MG CAPS      amLODipine (NORVASC) 5 MG tablet TAKE 1 TABLET (5 MG TOTAL) BY MOUTH DAILY. 90 tablet 3   Ascorbic Acid (VITAMIN C) 100 MG tablet Take 100 mg by mouth daily.     budesonide ([employer] BUDESONIDE) 32 MCG/ACT nasal spray Place 1 spray into both nostrils daily. 8.43 mL 5   Calcium Carbonate-Vit D-Min (CALTRATE 600+D PLUS MINERALS PO) Take 1 tablet by mouth daily.     cevimeline (  EVOXAC) 30 MG capsule Take 1 capsule (30 mg total) by mouth 3 (three) times daily. 270 capsule 1   cycloSPORINE (RESTASIS) 0.05 % ophthalmic emulsion Place 1 drop into both eyes 2 (two) times daily. 90 each 1   diazepam (VALIUM) 5 MG tablet Take 1 tablet (5 mg total) by mouth every 6 (six) hours as needed for anxiety. 2 tablet 0   DULoxetine (CYMBALTA) 30 MG capsule TAKE 1 CAPSULE BY MOUTH EVERY DAY 90 capsule 3   fexofenadine (ALLEGRA) 180 MG tablet Take 180 mg by mouth daily.     gabapentin (NEURONTIN) 100 MG capsule TAKE 2  CAPSULES (200 MG TOTAL) BY MOUTH 2 TIMES DAILY. 360 capsule 3   gabapentin (NEURONTIN) 600 MG tablet TAKE 1 TABLET BY MOUTH EVERY DAY 90 tablet 3   hydroxychloroquine (PLAQUENIL) 200 MG tablet Take 1 tablet (200 mg total) by mouth daily. 90 tablet 0   IMVEXXY MAINTENANCE PACK 4 MCG INST SMARTSIG:1 Vaginal Twice a Week     levothyroxine (SYNTHROID) 75 MCG tablet TAKE 1 TABLET BY MOUTH DAILY BEFORE BREAKFAST. 90 tablet 3   Melatonin 5 MG CAPS Take 1 tablet by mouth at bedtime.     meloxicam (MOBIC) 15 MG tablet Take 1 tablet (15 mg total) by mouth daily. 90 tablet 3   montelukast (SINGULAIR) 10 MG tablet Take 1 tablet (10 mg total) by mouth at bedtime. 90 tablet 1   Omega-3 Fatty Acids (FISH OIL) 1000 MG CAPS Take by mouth.     omeprazole (PRILOSEC) 20 MG capsule Take 1 capsule (20 mg total) by mouth daily. 90 capsule 3   polyethylene glycol (MIRALAX) 0.34 gm/ml SOLN      predniSONE (STERAPRED UNI-PAK 21 TAB) 5 MG (21) TBPK tablet SMARTSIG:1 Tablet(s) By Mouth     Propylene Glycol (SYSTANE BALANCE OP) Apply to eye.     SODIUM FLUORIDE 5000 PPM 1.1 % PSTE See admin instructions.     traMADol (ULTRAM) 50 MG tablet TAKE 1 TABLET BY MOUTH EVERY 12 HOURS AS NEEDED. 60 tablet 0   zoledronic acid (RECLAST) 5 MG/100ML SOLN injection Inject 5 mg into the vein once. Yearly infusion     zolpidem (AMBIEN) 5 MG tablet TAKE 1/2 TO 1 TABLET BY MOUTH NIGHTLY AS NEEDED FOR SLEEP 15 tablet 2   Diclofenac Sodium 1.5 % SOLN Apply 40 drops topically in the morning, at noon, in the evening, and at bedtime. (Patient not taking: Reported on 05/15/2023) 60 mL 1   triamcinolone cream (KENALOG) 0.1 % APPLY TO OUTER EYELID SKIN TWICE DAILY FOR 1 WEEK.     Facility-Administered Medications Prior to Visit  Medication Dose Route Frequency Provider Last Rate Last Admin   lidocaine (XYLOCAINE) 1 % (with pres) injection 10 mL  10 mL Other Once Kirsteins, Victorino Sparrow, MD       lidocaine HCl (PF) (XYLOCAINE) 2 % injection 5 mL  5 mL  Other Once Kirsteins, Victorino Sparrow, MD       Allergies  Allergen Reactions   Clindamycin/Lincomycin Nausea Only   Objective:   Today's Vitals   05/15/23 0948  BP: 120/68  Pulse: 68  Temp: 97.9 F (36.6 C)  TempSrc: Temporal  SpO2: 98%  Weight: 114 lb 3.2 oz (51.8 kg)  Height: 5\' 3"  (1.6 m)   Body mass index is 20.23 kg/m.   General: Well developed, well nourished. No acute distress. Psych: Alert and oriented. Normal mood and affect.  Health Maintenance Due  Topic Date Due  MAMMOGRAM  09/22/2022   Lab Results Last lipids Lab Results  Component Value Date   CHOL 222 (H) 02/01/2023   HDL 69.80 02/01/2023   LDLCALC 140 (H) 02/01/2023   TRIG 61.0 02/01/2023   CHOLHDL 3 02/01/2023   The 10-year ASCVD risk score (Arnett DK, et al., 2019) is: 11%   Values used to calculate the score:     Age: 38 years     Sex: Female     Is Non-Hispanic African American: No     Diabetic: No     Tobacco smoker: No     Systolic Blood Pressure: 120 mmHg     Is BP treated: Yes     HDL Cholesterol: 69.8 mg/dL     Total Cholesterol: 222 mg/dL     Assessment & Plan:   Problem List Items Addressed This Visit       Cardiovascular and Mediastinum   Essential hypertension - Primary   Blood pressure is in good control. Continue amlodipine 5 mg.        Endocrine   Hypothyroidism   Stable. Continue levothyroxine 75 mcg daily.        Other   Hyperlipidemia   Lipids are elevated with a ACC/AHA CV Risk score of 11%. In light of Ms. Marzette's autoimmune disorder, I suspect that this calculation underestimates her CV risk. I will have her complete a CAC CT scan to assess for potential benefit of a statin.      Relevant Orders   CT CARDIAC SCORING (SELF PAY ONLY)   Sjogren's syndrome (HCC)   Stable. Continue to follow with Dr. Dimple Casey.      Other Visit Diagnoses       Travel advice encounter       Provided general travel advice. UTD on immunizations. No special requirements for [place]. I will provide Paxlovid to have on hand.   Relevant Medications   nirmatrelvir/ritonavir (PAXLOVID) 20 x 150 MG & 10 x 100MG  TABS       Return in about 3 months (around 08/15/2023).   Loyola Mast, MD

## 2023-05-15 NOTE — Assessment & Plan Note (Signed)
 Lipids are elevated with a ACC/AHA CV Risk score of 11%. In light of Kathleen [name]'s autoimmune disorder, I suspect that this calculation underestimates her CV risk. I will have her complete a CAC CT scan to assess for potential benefit of a statin.

## 2023-05-15 NOTE — Assessment & Plan Note (Signed)
 Stable. Continue levothyroxine daily

## 2023-05-15 NOTE — Assessment & Plan Note (Signed)
 Blood pressure is in good control. Continue amlodipine 5 mg.

## 2023-05-25 ENCOUNTER — Encounter: Payer: Self-pay | Admitting: Family Medicine

## 2023-05-25 ENCOUNTER — Ambulatory Visit (HOSPITAL_BASED_OUTPATIENT_CLINIC_OR_DEPARTMENT_OTHER)
Admission: RE | Admit: 2023-05-25 | Discharge: 2023-05-25 | Disposition: A | Discharge status: Home / Self Care | Payer: Self-pay | Source: Ambulatory Visit | Attending: Family Medicine | Admitting: Family Medicine

## 2023-05-25 DIAGNOSIS — E782 Mixed hyperlipidemia: Secondary | ICD-10-CM | POA: Insufficient documentation

## 2023-05-25 MED ORDER — ATORVASTATIN CALCIUM 10 MG PO TABS: 10.0000 mg | ORAL_TABLET | Freq: Every day | ORAL | 3 refills | Status: DC

## 2023-05-25 NOTE — Addendum Note (Signed)
 Addended by: Loyola Mast on: 05/25/2023 05:09 PM   Modules accepted: Orders

## 2023-05-29 ENCOUNTER — Encounter: Payer: Self-pay | Admitting: Physical Medicine & Rehabilitation

## 2023-05-29 ENCOUNTER — Encounter: Payer: Medicare Other | Attending: Physical Medicine & Rehabilitation | Admitting: Physical Medicine & Rehabilitation

## 2023-05-29 DIAGNOSIS — M47816 Spondylosis without myelopathy or radiculopathy, lumbar region: Secondary | ICD-10-CM | POA: Diagnosis present

## 2023-05-29 MED ORDER — LIDOCAINE HCL (PF) 2 % IJ SOLN: 3.0000 mL | Freq: Once | INTRAMUSCULAR | Status: AC

## 2023-05-29 MED ORDER — LIDOCAINE HCL 1 % IJ SOLN: 10.0000 mL | Freq: Once | INTRAMUSCULAR | Status: AC

## 2023-05-29 NOTE — Progress Notes (Signed)
  PROCEDURE RECORD  Physical Medicine and Rehabilitation   Name: Kathleen [name] DOB:12/28/1950 MRN: [id number]  Date:05/29/2023  Physician: Claudette Laws, MD    Nurse/CMA: Nedra Hai, CMA  Allergies:  Allergies  Allergen Reactions   Clindamycin/Lincomycin Nausea Only    Consent Signed: Yes.    Is patient diabetic? No.  CBG today? .  Pregnant: No. LMP: No LMP recorded. Patient is postmenopausal. (age 73-55)  Anticoagulants: no Anti-inflammatory: yes (meloxicam) Antibiotics: no  Procedure: Right L3-4-5 Radiofrequency  Position: Prone Start Time: 11;21 am  End Time: 11:44 am  Fluoro Time: 57  RN/CMA Nedra Hai, CMA [name], CMA    Time 10:55 am 11:49 am    BP 135/71 149/55    Pulse 62 57    Respirations 16 16    O2 Sat 98 98    S/S 6 6    Pain Level 7/10 3/10     D/C home with husband, patient A & O X 3, D/C instructions reviewed, and sits independently.

## 2023-05-29 NOTE — Progress Notes (Signed)
RightL5 dorsal ramus., Right L4 and Right L3 medial branch radio frequency neurotomy under fluoroscopic guidance   Indication: Low back pain due to lumbar spondylosis which has been relieved on 2 occasions by greater than 50% by lumbar medial branch blocks at corresponding levels.  Informed consent was obtained after describing risks and benefits of the procedure with the patient, this includes bleeding, bruising, infection, paralysis and medication side effects. The patient wishes to proceed and has given written consent. The patient was placed in a prone position. The lumbar and sacral area was marked and prepped with Betadine. A 25-gauge 1-1/2 inch needle was inserted into the skin and subcutaneous tissue at 3 sites in one ML of 1% lidocaine was injected into each site. Then a 18-gauge 10 cm radio frequency needle with a 1 cm curved active tip was inserted targeting the Right S1 SAP/sacral ala junction. Bone contact was made and confirmed with lateral imaging.  motor stimulation at 2 Hz confirm proper needle location followed by injection of 1ml 2% MPF lidocaine. Then the Right L5 SAP/transverse process junction was targeted. Bone contact was made and confirmed with lateral imaging.  motor stimulation at 2 Hz confirm proper needle location followed by injection of 1ml 2% MPF lidocaine. Then the Right L4 SAP/transverse process junction was targeted. Bone contact was made and confirmed with lateral imaging. motor stimulation at 2 Hz confirm proper needle location followed by injection of 1ml 2% MPF lidocaine. Radio frequency lesion being at 80C for 90 seconds was performed. Needles were removed. Post procedure instructions and vital signs were performed. Patient tolerated procedure well. Followup appointment was given. 

## 2023-06-04 ENCOUNTER — Encounter: Payer: Self-pay | Admitting: Family Medicine

## 2023-06-04 DIAGNOSIS — K7689 Other specified diseases of liver: Secondary | ICD-10-CM | POA: Insufficient documentation

## 2023-08-01 ENCOUNTER — Ambulatory Visit: Payer: Self-pay | Admitting: Podiatry

## 2023-08-13 ENCOUNTER — Ambulatory Visit: Admitting: Family Medicine

## 2023-08-14 ENCOUNTER — Encounter: Payer: Self-pay | Admitting: Physical Medicine & Rehabilitation

## 2023-08-14 ENCOUNTER — Encounter: Attending: Physical Medicine & Rehabilitation | Admitting: Physical Medicine & Rehabilitation

## 2023-08-14 VITALS — BP 112/68 | HR 67 | Ht 63.0 in | Wt 113.0 lb

## 2023-08-14 DIAGNOSIS — M47816 Spondylosis without myelopathy or radiculopathy, lumbar region: Secondary | ICD-10-CM | POA: Diagnosis not present

## 2023-08-14 NOTE — Progress Notes (Signed)
 Subjective:    Patient ID: Kathleen [name], female    DOB: Feb 05, 1951, 73 y.o.   MRN: [id number]  HPI 73 year old female with history of chronic right-sided low back pain.  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  She has recently come back from a long trip to [place] and experienced some exacerbation of pain especially after the 11-hour flight back 05/29/23 Right L5 dorsal ramus., Right L4 and Right L3 medial branch radio frequency neurotomy under fluoroscopic guidance   No pain shooting down the leg  Getting back into usual home ex program  Pain Inventory Average Pain 7 Pain Right Now 4 My pain is constant, sharp, dull, and stabbing  In the last 24 hours, has pain interfered with the following? General activity 4 Relation with others 2 Enjoyment of life 4 What TIME of day is your pain at its worst? evening Sleep (in general) Fair  Pain is worse with: sitting, inactivity, standing, and some activites Pain improves with: rest, heat/ice, therapy/exercise, pacing activities, and injections Relief from Meds: 7  Family History  Problem Relation Age of Onset   Stroke Mother    Heart disease Mother    Osteoarthritis Mother    Heart disease Father    Hypertension Father    Heart disease Paternal Uncle    Cancer Paternal Uncle        Lung   Social History   Socioeconomic History   Marital status: Married    Spouse name: Not on file   Number of children: 3   Years of education: Not on file   Highest education level: Bachelor's degree (e.g., BA, AB, BS)  Occupational History   Occupation: Retired  Tobacco Use   Smoking status: Never    Passive exposure: Never   Smokeless tobacco: Never  Vaping Use   Vaping status: Never Used  Substance and Sexual Activity   Alcohol use: Yes    Alcohol/week: 1.0 standard drink of alcohol    Types: 1 Cans of beer per week    Comment: socially   Drug use: Never   Sexual  activity: Yes  Other Topics Concern   Not on file  Social History Narrative   Caffiene rare.   Working retired.     Social Drivers of Corporate investment banker Strain: Low Risk  (02/13/2023)   Overall Financial Resource Strain (CARDIA)    Difficulty of Paying Living Expenses: Not hard at all  Food Insecurity: No Food Insecurity (02/13/2023)   Hunger Vital Sign    Worried About Running Out of Food in the Last Year: Never true    Ran Out of Food in the Last Year: Never true  Transportation Needs: No Transportation Needs (02/13/2023)   PRAPARE - Administrator, Civil Service (Medical): No    Lack of Transportation (Non-Medical): No  Physical Activity: Sufficiently Active (02/13/2023)   Exercise Vital Sign    Days of Exercise per Week: 5 days    Minutes of Exercise per Session: 30 min  Recent Concern: Physical Activity - Insufficiently Active (01/29/2023)   Exercise Vital Sign    Days of Exercise per Week: 5 days    Minutes of Exercise per Session: 20 min  Stress: No Stress Concern Present (02/13/2023)   [employer] of Occupational Health - Occupational Stress Questionnaire    Feeling of Stress : Only a little  Social Connections: Socially Integrated (02/13/2023)   Social Connection and Isolation  Panel    Frequency of Communication with Friends and Family: More than three times a week    Frequency of Social Gatherings with Friends and Family: Once a week    Attends Religious Services: More than 4 times per year    Active Member of [employer] or Organizations: Yes    Attends Engineer, structural: More than 4 times per year    Marital Status: Married   Past Surgical History:  Procedure Laterality Date   CATARACT EXTRACTION, BILATERAL     KNEE ARTHROSCOPY W/ MENISCECTOMY Left    Past Surgical History:  Procedure Laterality Date   CATARACT EXTRACTION, BILATERAL     KNEE ARTHROSCOPY W/ MENISCECTOMY Left    Past Medical History:  Diagnosis Date   Acid  reflux    Hypothyroid    Osteoarthritis    Sjogren's syndrome (HCC)    Spondylosis of lumbar spine    There were no vitals taken for this visit.  Opioid Risk Score:   Fall Risk Score:  `1  Depression screen [facility] 2/9     10/03/2022   10:24 AM 09/18/2022    2:03 PM 06/22/2022   10:58 AM 05/16/2022   10:30 AM 01/17/2022   11:49 AM 12/08/2021   11:58 AM 11/29/2021   10:46 AM  Depression screen [facility] 2/9  Decreased Interest 0 0 0 0 0 0 0  Down, Depressed, Hopeless 0 0 0 0 0 0 0  PHQ - 2 Score 0 0 0 0 0 0 0  Altered sleeping  0       Tired, decreased energy  0       Change in appetite  0       Feeling bad or failure about yourself   0       Trouble concentrating  0       Moving slowly or fidgety/restless  0       Suicidal thoughts  0       PHQ-9 Score  0       Difficult doing work/chores  Not difficult at all         Review of Systems  Musculoskeletal:  Positive for back pain.       Pain lower back  All other systems reviewed and are negative.      Objective:   Physical Exam General No acute distress Mood affect appropriate Extremities without edema Negative straight leg raise  Lower extremity strength 5/5 bilateral hip flexor knee extensor ankle dorsiflexion Sacroiliac provocative testing Gaenslens: Negative Sacral thrust (prone) : Positive Lateral compression: Negative FABER's: Positive Distraction (supine): Negative Thigh thrust test: Negative       Assessment & Plan:   1.  Chronic low back pain some exacerbation of her lumbar spondylosis following long trip with prolonged sitting. We discussed that this does not appear to be her sacroiliac joint.  Recommend getting back into her home exercise program which is stretching combined with some therapy I will see her back in approximately 4 months to see if some of her pain is returning after the L3-L4 medial branch L5 dorsal ramus radiofrequency neurotomy performed on/02/2023

## 2023-08-15 ENCOUNTER — Ambulatory Visit: Admitting: Nurse Practitioner

## 2023-08-16 ENCOUNTER — Ambulatory Visit: Admitting: Nurse Practitioner

## 2023-08-22 ENCOUNTER — Encounter: Payer: Self-pay | Admitting: Family Medicine

## 2023-08-22 ENCOUNTER — Ambulatory Visit (INDEPENDENT_AMBULATORY_CARE_PROVIDER_SITE_OTHER): Admitting: Family Medicine

## 2023-08-22 VITALS — BP 110/64 | HR 66 | Temp 97.5°F | Ht 63.0 in | Wt 115.2 lb

## 2023-08-22 DIAGNOSIS — J301 Allergic rhinitis due to pollen: Secondary | ICD-10-CM

## 2023-08-22 DIAGNOSIS — I10 Essential (primary) hypertension: Secondary | ICD-10-CM

## 2023-08-22 DIAGNOSIS — E782 Mixed hyperlipidemia: Secondary | ICD-10-CM | POA: Diagnosis not present

## 2023-08-22 DIAGNOSIS — K7689 Other specified diseases of liver: Secondary | ICD-10-CM | POA: Diagnosis not present

## 2023-08-22 DIAGNOSIS — E039 Hypothyroidism, unspecified: Secondary | ICD-10-CM

## 2023-08-22 NOTE — Progress Notes (Signed)
 [facility] [address] [place] [address] Dept: [phone] Dept Fax: [phone]  Chronic Care Office Visit  Subjective:    Patient ID: Kathleen [name], female    DOB: [birth year], 73 y.o..   MRN: [id number]  Chief Complaint  Patient presents with   Hypertension    F/u HTN.  No concerns.     History of Present Illness:  Patient is in today for reassessment of chronic medical issues.  Ms. Demers has a history of  hypertension.  She is managed on amlodipine  5 mg daily.    Ms. Barletta has a history of hypothyroidism. She is managed on levothyroxine  75 mcg daily.  Ms. Bobeck has hyperlipidemia and an elevated coronary artery calcium  score. She is manage don atorvastatin  10 mg daily.   Ms. Ayars has a history of Sjogren's syndrome and inflammatory OA. She is managed on hydroxychloroquine  and several medicines focused on dry eyes and dry mouth.    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   Ganglion cyst of finger of left hand 07/03/2022   Family history of serrated polyposis syndrome (SPS)- Sister 11/01/2021   Dry eye syndrome of both eyes 10/18/2021   Sjogren's syndrome (HCC) 04/07/2021   Osteoarthritis of hands, bilateral 04/07/2021   Essential hypertension 02/27/2021   Chronic right sacroiliac pain 09/09/2020   Osteoporosis 11/29/2018   Piriformis syndrome of right side 10/23/2017   Spondylosis without myelopathy or radiculopathy, 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   Chronic myofascial pain 04/02/2017   ANA positive 01/25/2017   High risk medication use 05/30/2016   Xerostomia 05/03/2015   GERD (gastroesophageal reflux disease) 04/01/2015   IBS (irritable bowel syndrome) 04/01/2015   Insomnia 04/01/2015   Chronic seasonal allergic rhinitis due to pollen 02/04/2015   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   Tinnitus 02/04/2015   Multifactorial low back pain 12/15/2014   Personal history of other malignant neoplasm of skin 12/08/2011   Chronic back pain 08/24/2011   Hypothyroidism 02/28/1995   Past Surgical History:  Procedure Laterality Date   CATARACT EXTRACTION, BILATERAL     EYE SURGERY  2022   remove cataracts   KNEE ARTHROSCOPY W/ MENISCECTOMY Left    Family History  Problem Relation Age of Onset   Stroke Mother    Heart disease Mother    Osteoarthritis Mother    Arthritis Mother    Heart disease Father    Hypertension Father    Heart disease Paternal Uncle    Cancer Paternal Uncle        Lung   Outpatient Medications Prior to Visit  Medication Sig Dispense Refill   acetaminophen  (TYLENOL ) 325 MG tablet Take 650 mg by mouth every 6 (six) hours as needed.     Alpha-Lipoic Acid 300 MG CAPS      amLODipine  (NORVASC ) 5 MG tablet TAKE 1 TABLET (5 MG TOTAL) BY MOUTH DAILY. 90 tablet 3   Ascorbic Acid (VITAMIN C) 100 MG tablet Take 100 mg by mouth daily.     atorvastatin  (LIPITOR) 10 MG tablet Take 1 tablet (10 mg total) by mouth daily. 90 tablet 3   budesonide  ([facility] BUDESONIDE ) 32 MCG/ACT nasal spray Place 1 spray into both nostrils daily. 8.43 mL 5   Calcium  Carbonate-Vit D-Min (CALTRATE 600+D PLUS MINERALS PO) Take 1 tablet by mouth daily.     cevimeline  (EVOXAC ) 30 MG  capsule Take 1 capsule (30 mg total) by mouth 3 (three) times daily. 270 capsule 1   cycloSPORINE  (RESTASIS ) 0.05 % ophthalmic emulsion Place 1 drop into both eyes 2 (two) times daily. 90 each 1   diazepam  (VALIUM ) 5 MG tablet Take 1 tablet (5 mg total) by mouth every 6 (six) hours as needed for anxiety. 2 tablet 0   diclofenac  Sodium (VOLTAREN ) 1 % GEL Apply 2 g topically.     DULoxetine  (CYMBALTA ) 30 MG capsule TAKE 1 CAPSULE BY MOUTH EVERY DAY 90 capsule 3   fexofenadine (ALLEGRA) 180 MG tablet Take 180 mg by mouth daily.     gabapentin  (NEURONTIN ) 100 MG capsule TAKE 2  CAPSULES (200 MG TOTAL) BY MOUTH 2 TIMES DAILY. 360 capsule 3   gabapentin  (NEURONTIN ) 600 MG tablet TAKE 1 TABLET BY MOUTH EVERY DAY 90 tablet 3   hydroxychloroquine  (PLAQUENIL ) 200 MG tablet Take 1 tablet (200 mg total) by mouth daily. 90 tablet 0   IMVEXXY MAINTENANCE PACK 4 MCG INST SMARTSIG:1 Vaginal Twice a Week     levothyroxine  (SYNTHROID ) 75 MCG tablet TAKE 1 TABLET BY MOUTH DAILY BEFORE BREAKFAST. 90 tablet 3   Melatonin 5 MG CAPS Take 1 tablet by mouth at bedtime.     meloxicam  (MOBIC ) 15 MG tablet Take 1 tablet (15 mg total) by mouth daily. 90 tablet 3   Menthol, Topical Analgesic, 4 % GEL Apply topically.     montelukast  (SINGULAIR ) 10 MG tablet Take 1 tablet (10 mg total) by mouth at bedtime. 90 tablet 1   Omega-3 Fatty Acids (FISH OIL) 1000 MG CAPS Take by mouth.     omeprazole  (PRILOSEC) 20 MG capsule Take 1 capsule (20 mg total) by mouth daily. 90 capsule 3   polyethylene glycol (MIRALAX) 0.34 gm/ml SOLN      Propylene Glycol (SYSTANE BALANCE OP) Apply to eye.     SODIUM FLUORIDE 5000 PPM 1.1 % GEL dental gel at bedtime.     traMADol  (ULTRAM ) 50 MG tablet TAKE 1 TABLET BY MOUTH EVERY 12 HOURS AS NEEDED. 60 tablet 0   triamcinolone  cream (KENALOG ) 0.1 % APPLY TO OUTER EYELID SKIN TWICE DAILY FOR 1 WEEK.     zoledronic  acid (RECLAST ) 5 MG/100ML SOLN injection Inject 5 mg into the vein once. Yearly infusion     zolpidem  (AMBIEN ) 5 MG tablet TAKE 1/2 TO 1 TABLET BY MOUTH NIGHTLY AS NEEDED FOR SLEEP 15 tablet 2   DOXYCYCLINE       Facility-Administered Medications Prior to Visit  Medication Dose Route Frequency Provider Last Rate Last Admin   lidocaine  (XYLOCAINE ) 1 % (with pres) injection 10 mL  10 mL Other Once Kirsteins, Prentice BRAVO, MD       lidocaine  (XYLOCAINE ) 1 % (with pres) injection 10 mL  10 mL Other Once        lidocaine  HCl (PF) (XYLOCAINE ) 2 % injection 3 mL  3 mL Other Once        lidocaine  HCl (PF) (XYLOCAINE ) 2 % injection 5 mL  5 mL Other Once Kirsteins, Prentice BRAVO,  MD       Allergies  Allergen Reactions   Clindamycin/Lincomycin Nausea Only   Objective:   Today's Vitals   08/22/23 1354  BP: 110/64  Pulse: 66  Temp: (!) 97.5 F (36.4 C)  TempSrc: Temporal  SpO2: 99%  Weight: 115 lb 3.2 oz (52.3 kg)  Height: 5' 3 (1.6 m)   Body mass index is 20.41 kg/m.   General: Well  developed, well nourished. No acute distress. Psych: Alert and oriented. Normal mood and affect.  Health Maintenance Due  Topic Date Due   Medicare Annual Wellness (AWV)  09/18/2023     Assessment & Plan:   Problem List Items Addressed This Visit       Cardiovascular and Mediastinum   Essential hypertension - Primary   Blood pressure is in good control. Continue amlodipine  5 mg.        Respiratory   Chronic seasonal allergic rhinitis due to pollen   Discussed management of secretions. I recommend she use a nettie pot, as her Sjgren's likely makes her mucous thicker.        Digestive   Hepatic cyst   An incidentla cyst was noted on the report from her CAC CT scan. I will order an ultrasound to follow this up.      Relevant Orders   US  ABDOMEN LIMITED RUQ (LIVER/GB)     Endocrine   Hypothyroidism   Stable. Continue levothyroxine  75 mcg daily.        Other   Hyperlipidemia   Lipids are elevated with a ACC/AHA CV Risk score of 11%. In light of Ms. Karn's autoimmune disorder, I suspect that this calculation underestimates her CV risk. Her CAC CT scan showed calcifications placing her in the 86th percentile. She is now on atorvastatin  10 mg daily.       Return in about 3 months (around 11/22/2023) for Reassessment.   Garnette CHRISTELLA Simpler, MD

## 2023-08-22 NOTE — Assessment & Plan Note (Signed)
 An incidentla cyst was noted on the report from her CAC CT scan. I will order an ultrasound to follow this up.

## 2023-08-22 NOTE — Assessment & Plan Note (Signed)
 Discussed management of secretions. I recommend she use a nettie pot, as her Sjgren's likely makes her mucous thicker.

## 2023-08-22 NOTE — Assessment & Plan Note (Signed)
 Stable. Continue levothyroxine daily

## 2023-08-22 NOTE — Assessment & Plan Note (Signed)
 Lipids are elevated with a ACC/AHA CV Risk score of 11%. In light of Kathleen [name]'s autoimmune disorder, I suspect that this calculation underestimates her CV risk. Her CAC CT scan showed calcifications placing her in the 86th percentile. She is now on atorvastatin  10 mg daily.

## 2023-08-22 NOTE — Assessment & Plan Note (Signed)
 Blood pressure is in good control. Continue amlodipine 5 mg.

## 2023-08-27 ENCOUNTER — Encounter: Payer: Self-pay | Admitting: Family Medicine

## 2023-08-27 DIAGNOSIS — E782 Mixed hyperlipidemia: Secondary | ICD-10-CM

## 2023-08-27 MED ORDER — ROSUVASTATIN CALCIUM 5 MG PO TABS: 5.0000 mg | ORAL_TABLET | Freq: Every day | ORAL | 3 refills | Status: AC

## 2023-09-07 ENCOUNTER — Other Ambulatory Visit: Payer: Self-pay | Admitting: Internal Medicine

## 2023-09-07 ENCOUNTER — Other Ambulatory Visit: Payer: Self-pay | Admitting: Family Medicine

## 2023-09-07 DIAGNOSIS — M47816 Spondylosis without myelopathy or radiculopathy, lumbar region: Secondary | ICD-10-CM

## 2023-09-07 DIAGNOSIS — M3500 Sicca syndrome, unspecified: Secondary | ICD-10-CM

## 2023-09-07 NOTE — Telephone Encounter (Signed)
 Last Fill: 04/12/2023  Eye exam: 03/13/2022 normal   Labs: 04/12/2023 Sedimentation rate and complements were normal do not indicate increased inflammation.  Her blood counts were normal.  Metabolic panel shows normal kidney function.  Her electrolytes show slightly low sodium and chloride.   Next Visit: 10/10/2023  Last Visit: 04/12/2023  DX: Sjogren's syndrome   Current Dose per office note 04/12/2023: hydroxychloroquine  200 mg daily   Okay to refill Plaquenil ?   Contacted patient to update her eye exam patient said she would get it scheduled an let us  know when the appointment is.

## 2023-09-08 ENCOUNTER — Other Ambulatory Visit: Payer: Self-pay | Admitting: Family Medicine

## 2023-09-08 DIAGNOSIS — J301 Allergic rhinitis due to pollen: Secondary | ICD-10-CM

## 2023-09-11 ENCOUNTER — Encounter: Payer: Self-pay | Admitting: Family Medicine

## 2023-09-14 ENCOUNTER — Other Ambulatory Visit: Payer: Self-pay | Admitting: Family Medicine

## 2023-09-14 DIAGNOSIS — I10 Essential (primary) hypertension: Secondary | ICD-10-CM

## 2023-09-14 NOTE — Telephone Encounter (Signed)
 Requesting: AMLODIPINE  BESYLATE 5 MG TAB  Last Visit: 08/22/2023 Next Visit: 11/23/2023 Last Refill: 09/20/2022  Please Advise

## 2023-09-20 ENCOUNTER — Other Ambulatory Visit: Payer: Self-pay | Admitting: Family Medicine

## 2023-09-20 DIAGNOSIS — F5101 Primary insomnia: Secondary | ICD-10-CM

## 2023-09-20 NOTE — Telephone Encounter (Signed)
 Name of Medication: Ambien  5mg  Name of Pharmacy: [id number] [place], [place] - [address]   Last Fill or Written Date and Quantity: 02/01/23 15tab 2refills Last Office Visit and Type: 08/22/23 HTN Next Office Visit and Type: 09/21/23 for Annual Wellness Last Controlled Substance Agreement Date: none Last UDS: none

## 2023-09-21 ENCOUNTER — Ambulatory Visit: Payer: Medicare Other

## 2023-09-21 DIAGNOSIS — Z0000 Encounter for general adult medical examination without abnormal findings: Secondary | ICD-10-CM

## 2023-09-21 NOTE — Progress Notes (Signed)
 Subjective:   Kathleen [name] is a 73 y.o. who presents for a Medicare Wellness preventive visit.  As a reminder, Annual Wellness Visits don't include a physical exam, and some assessments may be limited, especially if this visit is performed virtually. We may recommend an in-person follow-up visit with your provider if needed.  Visit Complete: Virtual I connected with  Kathleen [name] on 09/21/23 by a audio enabled telemedicine application and verified that I am speaking with the correct person using two identifiers.  Patient Location: Home  Provider Location: Office/Clinic  I discussed the limitations of evaluation and management by telemedicine. The patient expressed understanding and agreed to proceed.  Vital Signs: Because this visit was a virtual/telehealth visit, some criteria may be missing or patient reported. Any vitals not documented were not able to be obtained and vitals that have been documented are patient reported.  VideoError- Librarian, academic were attempted between this provider and patient, however failed, due to patient having technical difficulties OR patient did not have access to video capability.  We continued and completed visit with audio only.   Persons Participating in Visit: Patient.  AWV Questionnaire: Yes: Patient Medicare AWV questionnaire was completed by the patient on 09/18/2023; I have confirmed that all information answered by patient is correct and no changes since this date.  Cardiac Risk Factors include: advanced age (>36men, >55 women);hypertension     Objective:    Today's Vitals   09/21/23 1336  PainSc: 7    There is no height or weight on file to calculate BMI.     09/21/2023    1:42 PM 09/18/2022    2:02 PM 12/08/2021   11:58 AM 08/31/2021    2:04 PM  Advanced Directives  Does Patient Have a Medical Advance Directive? Yes Yes Yes Yes  Type of Estate agent of [place];Living will  Healthcare Power of [place];Living will Healthcare Power of [place];Living will Healthcare Power of [place];Living will  Copy of Healthcare Power of Attorney in Chart? No - copy requested No - copy requested  No - copy requested    Current Medications (verified) Outpatient Encounter Medications as of 09/21/2023  Medication Sig   acetaminophen  (TYLENOL ) 325 MG tablet Take 650 mg by mouth every 6 (six) hours as needed.   Alpha-Lipoic Acid 300 MG CAPS    amLODipine  (NORVASC ) 5 MG tablet TAKE 1 TABLET (5 MG TOTAL) BY MOUTH DAILY.   Ascorbic Acid (VITAMIN C) 100 MG tablet Take 100 mg by mouth daily.   budesonide  ([facility] BUDESONIDE ) 32 MCG/ACT nasal spray Place 1 spray into both nostrils daily.   Calcium  Carbonate-Vit D-Min (CALTRATE 600+D PLUS MINERALS PO) Take 1 tablet by mouth daily.   cevimeline  (EVOXAC ) 30 MG capsule Take 1 capsule (30 mg total) by mouth 3 (three) times daily.   cycloSPORINE  (RESTASIS ) 0.05 % ophthalmic emulsion Place 1 drop into both eyes 2 (two) times daily.   diazepam  (VALIUM ) 5 MG tablet Take 1 tablet (5 mg total) by mouth every 6 (six) hours as needed for anxiety.   diclofenac  Sodium (VOLTAREN ) 1 % GEL Apply 2 g topically.   DULoxetine  (CYMBALTA ) 30 MG capsule TAKE 1 CAPSULE BY MOUTH EVERY DAY   fexofenadine (ALLEGRA) 180 MG tablet Take 180 mg by mouth daily.   gabapentin  (NEURONTIN ) 100 MG capsule TAKE 2 CAPSULES (200 MG TOTAL) BY MOUTH 2 TIMES DAILY.   gabapentin  (NEURONTIN ) 600 MG tablet TAKE 1 TABLET BY MOUTH EVERY DAY   hydroxychloroquine  (PLAQUENIL ) 200  MG tablet TAKE 1 TABLET BY MOUTH EVERY DAY   IMVEXXY MAINTENANCE PACK 4 MCG INST SMARTSIG:1 Vaginal Twice a Week   levothyroxine  (SYNTHROID ) 75 MCG tablet TAKE 1 TABLET BY MOUTH DAILY BEFORE BREAKFAST.   Melatonin 5 MG CAPS Take 1 tablet by mouth at bedtime.   meloxicam  (MOBIC ) 15 MG tablet Take 1 tablet (15 mg total) by mouth daily.   Menthol, Topical Analgesic, 4 % GEL Apply topically.   montelukast  (SINGULAIR )  10 MG tablet TAKE 1 TABLET BY MOUTH EVERYDAY AT BEDTIME   Omega-3 Fatty Acids (FISH OIL) 1000 MG CAPS Take by mouth.   omeprazole  (PRILOSEC) 20 MG capsule Take 1 capsule (20 mg total) by mouth daily.   polyethylene glycol (MIRALAX) 0.34 gm/ml SOLN    Propylene Glycol (SYSTANE BALANCE OP) Apply to eye.   rosuvastatin  (CRESTOR ) 5 MG tablet Take 1 tablet (5 mg total) by mouth daily.   SODIUM FLUORIDE 5000 PPM 1.1 % GEL dental gel at bedtime.   traMADol  (ULTRAM ) 50 MG tablet TAKE 1 TABLET BY MOUTH EVERY 12 HOURS AS NEEDED.   triamcinolone  cream (KENALOG ) 0.1 % APPLY TO OUTER EYELID SKIN TWICE DAILY FOR 1 WEEK.   zoledronic  acid (RECLAST ) 5 MG/100ML SOLN injection Inject 5 mg into the vein once. Yearly infusion   zolpidem  (AMBIEN ) 5 MG tablet TAKE 1/2 TO 1 TABLET BY MOUTH NIGHTLY AS NEEDED FOR SLEEP   Facility-Administered Encounter Medications as of 09/21/2023  Medication   lidocaine  (XYLOCAINE ) 1 % (with pres) injection 10 mL   lidocaine  (XYLOCAINE ) 1 % (with pres) injection 10 mL   lidocaine  HCl (PF) (XYLOCAINE ) 2 % injection 3 mL   lidocaine  HCl (PF) (XYLOCAINE ) 2 % injection 5 mL    Allergies (verified) Clindamycin/lincomycin   History: Past Medical History:  Diagnosis Date   Acid reflux    Allergy    seasonal   Cataract 2019   removed 2022   Hypertension 2022   still watching   Hypothyroid    Osteoarthritis    Sjogren's syndrome (HCC)    Spondylosis of lumbar spine    Past Surgical History:  Procedure Laterality Date   CATARACT EXTRACTION, BILATERAL     EYE SURGERY  2022   remove cataracts   KNEE ARTHROSCOPY W/ MENISCECTOMY Left    Family History  Problem Relation Age of Onset   Stroke Mother    Heart disease Mother    Osteoarthritis Mother    Arthritis Mother    Heart disease Father    Hypertension Father    Heart disease Paternal Uncle    Cancer Paternal Uncle        Lung   Social History   Socioeconomic History   Marital status: Married    Spouse name:  Not on file   Number of children: 3   Years of education: Not on file   Highest education level: Bachelor's degree (e.g., BA, AB, BS)  Occupational History   Occupation: Retired  Tobacco Use   Smoking status: Never    Passive exposure: Never   Smokeless tobacco: Never  Vaping Use   Vaping status: Never Used  Substance and Sexual Activity   Alcohol use: Yes    Alcohol/week: 1.0 standard drink of alcohol    Types: 1 Cans of beer per week    Comment: socially   Drug use: Never   Sexual activity: Yes  Other Topics Concern   Not on file  Social History Narrative   Caffiene rare.   Working  retired.     Social Drivers of Corporate investment banker Strain: Low Risk  (09/21/2023)   Overall Financial Resource Strain (CARDIA)    Difficulty of Paying Living Expenses: Not hard at all  Food Insecurity: No Food Insecurity (09/21/2023)   Hunger Vital Sign    Worried About Running Out of Food in the Last Year: Never true    Ran Out of Food in the Last Year: Never true  Transportation Needs: No Transportation Needs (09/21/2023)   PRAPARE - Administrator, Civil Service (Medical): No    Lack of Transportation (Non-Medical): No  Physical Activity: Sufficiently Active (09/21/2023)   Exercise Vital Sign    Days of Exercise per Week: 5 days    Minutes of Exercise per Session: 40 min  Stress: No Stress Concern Present (09/21/2023)   [employer] of Occupational Health - Occupational Stress Questionnaire    Feeling of Stress: Not at all  Social Connections: Socially Integrated (09/21/2023)   Social Connection and Isolation Panel    Frequency of Communication with Friends and Family: More than three times a week    Frequency of Social Gatherings with Friends and Family: Three times a week    Attends Religious Services: More than 4 times per year    Active Member of Clubs or Organizations: Yes    Attends Engineer, structural: More than 4 times per year    Marital Status:  Married    Tobacco Counseling Counseling given: Not Answered    Clinical Intake:  Pre-visit preparation completed: Yes  Pain : 0-10 Pain Score: 7  Pain Type: Chronic pain Pain Location: Back Pain Orientation: Lower Pain Descriptors / Indicators: Aching Pain Onset: More than a month ago Pain Frequency: Intermittent     Diabetes: No  No results found for: HGBA1C   How often do you need to have someone help you when you read instructions, pamphlets, or other written materials from your doctor or pharmacy?: 1 - Never  Interpreter Needed?: No  Information entered by :: NAllen LPN   Activities of Daily Living     09/18/2023    9:59 AM  In your present state of health, do you have any difficulty performing the following activities:  Hearing? 0  Vision? 0  Difficulty concentrating or making decisions? 0  Walking or climbing stairs? 0  Dressing or bathing? 0  Doing errands, shopping? 0  Preparing Food and eating ? N  Using the Toilet? N  In the past six months, have you accidently leaked urine? N  Do you have problems with loss of bowel control? N  Managing your Medications? N  Managing your Finances? N  Housekeeping or managing your Housekeeping? N    Patient Care Team: Thedora Garnette HERO, MD as PCP - General (Family Medicine) Jeannetta Lonni ORN, MD as Consulting Physician (Rheumatology) Latisha Medford, MD as Consulting Physician (Obstetrics and Gynecology) Carilyn Prentice BRAVO, MD as Consulting Physician (Physical Medicine and Rehabilitation) [facility], [facility]   I have updated your Care Teams any recent Medical Services you may have received from other providers in the past year.     Assessment:   This is a routine wellness examination for Kathleen [name].  Hearing/Vision screen Hearing Screening - Comments:: Denies hearing issues Vision Screening - Comments:: Regular eye exams, [facility]   Goals Addressed             This Visit's  Progress    Patient Stated  09/21/2023, keep going       Depression Screen     09/21/2023    1:44 PM 08/14/2023    1:10 PM 10/03/2022   10:24 AM 09/18/2022    2:03 PM 06/22/2022   10:58 AM 05/16/2022   10:30 AM 01/17/2022   11:49 AM  PHQ 2/9 Scores  PHQ - 2 Score 0 0 0 0 0 0 0  PHQ- 9 Score 0   0       Fall Risk     09/18/2023    9:59 AM 08/14/2023    1:10 PM 05/29/2023   10:53 AM 04/12/2023    2:10 PM 10/03/2022   10:24 AM  Fall Risk   Falls in the past year? 0 0 0 0 0  Number falls in past yr: 0 0   0  Injury with Fall? 0 0   0  Risk for fall due to : Medication side effect      Follow up Falls prevention discussed;Falls evaluation completed        MEDICARE RISK AT HOME:  Medicare Risk at Home If so, are there any without handrails?: (Patient-Rptd) No Home free of loose throw rugs in walkways, pet beds, electrical cords, etc?: (Patient-Rptd) Yes Adequate lighting in your home to reduce risk of falls?: (Patient-Rptd) Yes Life alert?: (Patient-Rptd) No Use of a cane, walker or w/c?: (Patient-Rptd) No Grab bars in the bathroom?: (Patient-Rptd) Yes Shower chair or bench in shower?: (Patient-Rptd) No Elevated toilet seat or a handicapped toilet?: (Patient-Rptd) Yes  TIMED UP AND GO:  Was the test performed?  No  Cognitive Function: 6CIT completed        09/21/2023    1:44 PM 09/18/2022    2:03 PM  6CIT Screen  What Year? 0 points 0 points  What month? 0 points 0 points  What time? 0 points 0 points  Count back from 20 0 points 2 points  Months in reverse 0 points 0 points  Repeat phrase 0 points 2 points  Total Score 0 points 4 points    Immunizations Immunization History  Administered Date(s) Administered   Fluad Quad(high Dose 65+) 12/04/2018   Influenza Split 11/27/2020, 12/06/2021, 12/15/2022   Influenza, High Dose Seasonal PF 12/24/2017   PFIZER Comirnaty(Gray Top)Covid-19 Tri-Sucrose Vaccine 03/24/2019, 04/14/2019, 12/15/2019, 06/14/2020   Pfizer  Covid-19 Vaccine Bivalent Booster 28yrs & up 12/09/2020, 12/06/2021, 12/15/2022   Pneumococcal Conjugate-13 11/29/2018   Pneumococcal Polysaccharide-23 08/12/2020   Tdap 03/26/2008, 10/07/2018   Zoster Recombinant(Shingrix) 11/26/2017, 02/05/2018   Zoster, Live 11/23/2011, 12/14/2011    Screening Tests Health Maintenance  Topic Date Due   COVID-19 Vaccine (8 - 2024-25 season) 02/09/2023   INFLUENZA VACCINE  09/28/2023   MAMMOGRAM  01/18/2024   Medicare Annual Wellness (AWV)  09/20/2024   Colonoscopy  09/25/2025   DTaP/Tdap/Td (3 - Td or Tdap) 10/06/2028   Pneumococcal Vaccine: 50+ Years  Completed   DEXA SCAN  Completed   Hepatitis C Screening  Completed   Zoster Vaccines- Shingrix  Completed   Hepatitis B Vaccines  Aged Out   HPV VACCINES  Aged Out   Meningococcal B Vaccine  Aged Out    Health Maintenance  Health Maintenance Due  Topic Date Due   COVID-19 Vaccine (8 - 2024-25 season) 02/09/2023   Health Maintenance Items Addressed: Up to date  Additional Screening:  Vision Screening: Recommended annual ophthalmology exams for early detection of glaucoma and other disorders of the eye. Would you like a referral to  an eye doctor? No    Dental Screening: Recommended annual dental exams for proper oral hygiene  Community Resource Referral / Chronic Care Management: CRR required this visit?  No   CCM required this visit?  No   Plan:    I have personally reviewed and noted the following in the patient's chart:   Medical and social history Use of alcohol, tobacco or illicit drugs  Current medications and supplements including opioid prescriptions. Patient is not currently taking opioid prescriptions. Functional ability and status Nutritional status Physical activity Advanced directives List of other physicians Hospitalizations, surgeries, and ER visits in previous 12 months Vitals Screenings to include cognitive, depression, and falls Referrals and  appointments  In addition, I have reviewed and discussed with patient certain preventive protocols, quality metrics, and best practice recommendations. A written personalized care plan for preventive services as well as general preventive health recommendations were provided to patient.   Ardella FORBES Dawn, LPN   [id number]/[id number]   After Visit Summary: (MyChart) Due to this being a telephonic visit, the after visit summary with patients personalized plan was offered to patient via MyChart   Notes: Nothing significant to report at this time.

## 2023-09-21 NOTE — Patient Instructions (Signed)
 Kathleen [name] , Thank you for taking time out of your busy schedule to complete your Annual Wellness Visit with me. I enjoyed our conversation and look forward to speaking with you again next year. I, as well as your care team,  appreciate your ongoing commitment to your health goals. Please review the following plan we discussed and let me know if I can assist you in the future. Your Game plan/ To Do List    Referrals: If you haven't heard from the office you've been referred to, please reach out to them at the phone provided.  N/a Follow up Visits: Next Medicare AWV with our clinical staff: 09/22/2024 at 1:40   Have you seen your provider in the last 6 months (3 months if uncontrolled diabetes)? Yes Next Office Visit with your provider: 11/23/2023 at 10:40  Clinician Recommendations:  Aim for 30 minutes of exercise or brisk walking, 6-8 glasses of water, and 5 servings of fruits and vegetables each day.       This is a list of the screening recommended for you and due dates:  Health Maintenance  Topic Date Due   COVID-19 Vaccine (8 - 2024-25 season) 02/09/2023   Flu Shot  09/28/2023   Mammogram  01/18/2024   Medicare Annual Wellness Visit  09/20/2024   Colon Cancer Screening  09/25/2025   DTaP/Tdap/Td vaccine (3 - Td or Tdap) 10/06/2028   Pneumococcal Vaccine for age over 73  Completed   DEXA scan (bone density measurement)  Completed   Hepatitis C Screening  Completed   Zoster (Shingles) Vaccine  Completed   Hepatitis B Vaccine  Aged Out   HPV Vaccine  Aged Out   Meningitis B Vaccine  Aged Out    Advanced directives: (Copy Requested) Please bring a copy of your health care power of attorney and living will to the office to be added to your chart at your convenience. You can mail to [facility] [address] [place], [place] [address] or email to [email or web address] Advance Care Planning is important because it:  [x]  Makes sure you receive the medical care  that is consistent with your values, goals, and preferences  [x]  It provides guidance to your family and loved ones and reduces their decisional burden about whether or not they are making the right decisions based on your wishes.  Follow the link provided in your after visit summary or read over the paperwork we have mailed to you to help you started getting your Advance Directives in place. If you need assistance in completing these, please reach out to us  so that we can help you!  See attachments for Preventive Care and Fall Prevention Tips.

## 2023-09-27 ENCOUNTER — Ambulatory Visit (HOSPITAL_BASED_OUTPATIENT_CLINIC_OR_DEPARTMENT_OTHER)
Admission: RE | Admit: 2023-09-27 | Discharge: 2023-09-27 | Disposition: A | Discharge status: Home / Self Care | Source: Ambulatory Visit | Attending: Family Medicine | Admitting: Family Medicine

## 2023-09-27 DIAGNOSIS — K7689 Other specified diseases of liver: Secondary | ICD-10-CM | POA: Insufficient documentation

## 2023-09-28 NOTE — Progress Notes (Signed)
 Office Visit Note  Patient: Kathleen [name]             Date of Birth: 1950-03-11           MRN: [id number]             PCP: Thedora Garnette HERO, MD Referring: Thedora Garnette HERO, MD Visit Date: 10/10/2023   Subjective:  Follow-up (Patient states she would like to talk about blood tests.)  Discussed the use of AI scribe software for clinical note transcription with the patient, who gave verbal consent to proceed.  History of Present Illness   Kathleen [name] is a 73 y.o. female here for follow up for Sjogren's syndrome on hydroxychloroquine  200 mg daily, restasis , and cevimeline  30 mg 3 times daily.    She experiences significant pain and stiffness in her fingers with some associated swelling.  She is currently prescribed meloxicam  gabapentin  and Cymbalta  with her PCP Dr. Thedora which are fairly beneficial for her joint pain and stiffness.  She is also prescribed tramadol  as needed from Dr. Carilyn but takes this only occasionally. She uses a Allessandra Bernardi heating pad for comfort.  She had a previous evaluation of her hands by Dr. Stefani discussing advanced triscaphe joint degeneration with potential for surgery but not recommended at the time.    She also reports looking into a clinical study based out of [facility] for Sjogren syndrome but was excluded due to negative SSA antibody testing as one of the admission criteria for Sjogren's classification.  Previous HPI 04/12/2023 Kathleen [name] is a 73 y.o. female here for follow up for Sjogren's syndrome on hydroxychloroquine  200 mg daily and cevimeline  30 mg 3 times daily.    She experiences moderate symptoms from Sjogren's syndrome, with significant dryness in her mouth and eyes. The mouth dryness causes discomfort against her teeth. She uses Plaquenil  (hydroxychloroquine ) and sedumiline at full dose, along with Restasis  eye drops twice daily. She previously tried a nasal spray, Tylosin, but found it ineffective and uncomfortable. She maintains a  humidified environment at home to help manage her symptoms. No sores or ulcers in the mouth or nose are present.   She has ongoing hand arthritis, particularly in the triscaphe region, with persistent swelling and occasional difficulty with rings fitting. She has been prescribed tramadol  for pain management, primarily for her back issues, and has not noticed significant changes in joint swelling. 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 Her hands are always swollen, though she does not track specific episodes of increased swelling.   Chronic back pain is present, particularly in the sacroiliac and lumbar regions, which has worsened over time. She has previously undergone radiofrequency ablation treatments and is considering further interventions before an upcoming cruise in May.   Recently, she experienced irritation in the inner corner of her left eye, which has improved over the past week. No major illnesses such as upper respiratory infections, COVID, or flu have been reported recently.      Previous HPI 10/10/2022 Kathleen [name] is a 73 y.o. female here for follow up for Sjogren's syndrome on hydroxychloroquine  200 mg daily and cevimeline  30 mg 3 times daily.  She had some benefit in chronic low back pain with nerve ablation and working with physical therapy on this plus some related gluteal muscle pain or tendinopathy.  Currently having worse trouble with joint pain in her fingers mostly on the right hand and left wrist.  Worst at the  second and fourth digit and getting some skin irritation between the fingers. Not seeing much visible swelling or discoloration.     Previous HPI 04/11/2022 Kathleen [name] is a 73 y.o. female here for follow up for Sjogren's syndrome on hydroxychloroquine  200 mg daily and cevimeline  30 mg 3 times daily.  Currently taking meloxicam  15 mg daily consistently worst area of pain  currently in the back but also getting a little bit worse in her hands compared to last visit.  She is scheduled for lumbar spine radiofrequency ablation on the 20th.  On Systane and Restasis  eyedrops.  She feels like her concentration and brain fog is also slightly worse than usual in the past month or 2.   Previous HPI 10/05/21 Kathleen [name] is a 73 y.o. female here for follow up for sjogren's syndrome on HCQ 200 mg daily. She is taking cevimeline  30 mg TID for dry mouth and using restasis  and lubricating eye drops for dry eyes. She started using the Tyrvaya  in the past 2 weeks so far not sure about the amount of difference. Eye dryness has possibly been a bit increased recently. She has pain and stiffness in her hands is bothersome as well. She takes meloxicam  gabapentin  and duloxetine  largely for back pain but also som help for hands. She has not seen much difference when try topical diclofenac . Saw Dr. Camella for evaluation of options with a finger cyst and her finger deformities, but currently holding off on any aggressive steps.   Previous HPI 04/07/2021 Kathleen [name] is a 73 y.o. female here for sjogren's syndrome on hydroxycloroquine 200 mg daily and cevimeline  TID for dry mouth. She previously saw Dr. Dollene for this problem.  She was originally diagnosed after establishing care with him in 2016 with existing problems of chronic arthritis affecting facet joints knees and hands.  Work-up revealed a positive ANA with significant eye and mouth dryness symptoms no specific extractable nuclear antibodies identified.  She was not sure since a specific time point her symptoms developed somewhat progressive over preceding years.  She was started on hydroxychloroquine  with partial symptom improvement as well as continuing topical diclofenac , oral meloxicam , gabapentin , and Cymbalta  for chronic degenerative joint pain especially from the back.  Symptoms in her fingers and knees are persistent but not severely  limiting of activity.  She has noticed some progression of the finger deformities over the past several years but usually without acute swelling redness or warmth. For her dry mouth symptoms she uses a Biotene spray drinks frequently and xylitol oral lozenges.  She takes cevimeline  3 times daily with a good benefit and no significant intolerance.  She denies any pain or swelling around major salivary glands or cervical lymphadenopathy. For dry eyes she uses Systane drops also using Restasis  drops in both eyes twice daily with a good benefit.  She never experiences any periorbital swelling has no previous major abrasions or other complications.  She has regular follow-up with her eye Dr. Elsie Andreas at [facility] most recent in August 2022 with normal OCT testing and annual follow-up.  She most recently was started on a trial of Tyrvaya  nasal spray she is not sure if there was a large change in symptoms but also did not continue the medicine for a long time due to lack of follow-up and finishing the sample amount.     Labs reviewed 06/2017 SSA neg SSB neg   12/2014 ANA 1:160 homogenous   Review of Systems  Constitutional:  Positive for fatigue.  HENT:  Positive for mouth dryness. Negative for mouth sores.   Eyes:  Positive for dryness.  Respiratory:  Negative for shortness of breath.   Cardiovascular:  Negative for chest pain and palpitations.  Gastrointestinal:  Negative for blood in stool, constipation and diarrhea.  Endocrine: Negative for increased urination.  Genitourinary:  Negative for involuntary urination.  Musculoskeletal:  Positive for joint pain, joint pain, joint swelling, muscle weakness, morning stiffness and muscle tenderness. Negative for gait problem, myalgias and myalgias.  Skin:  Negative for color change, rash, hair loss and sensitivity to sunlight.  Allergic/Immunologic: Negative for susceptible to infections.  Neurological:  Negative for dizziness and headaches.   Hematological:  Negative for swollen glands.  Psychiatric/Behavioral:  Positive for sleep disturbance. Negative for depressed mood. The patient is not nervous/anxious.     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   Ganglion cyst of finger of left hand 07/03/2022   Family history of serrated polyposis syndrome (SPS)- Sister 11/01/2021   Dry eye syndrome of both eyes 10/18/2021   Sjogren's syndrome (HCC) 04/07/2021   Osteoarthritis of hands, bilateral 04/07/2021   Essential hypertension 02/27/2021   Chronic right sacroiliac pain 09/09/2020   Osteoporosis 11/29/2018   Piriformis syndrome of right side 10/23/2017   Spondylosis without myelopathy or radiculopathy, 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   Chronic myofascial pain 04/02/2017   ANA positive 01/25/2017   High risk medication use 05/30/2016   Xerostomia 05/03/2015   GERD (gastroesophageal reflux disease) 04/01/2015   IBS (irritable bowel syndrome) 04/01/2015   Insomnia 04/01/2015   Chronic seasonal allergic rhinitis due to pollen 02/04/2015   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   Tinnitus 02/04/2015   Multifactorial low back pain 12/15/2014   Personal history of other malignant neoplasm of skin 12/08/2011   Chronic back pain 08/24/2011   Hypothyroidism 02/28/1995    Past Medical History:  Diagnosis Date   Acid reflux    Allergy    seasonal   Cataract 2019   removed 2022   Hypertension 2022   still watching   Hypothyroid    Osteoarthritis    Sjogren's syndrome (HCC)    Spondylosis of lumbar spine     Family History  Problem Relation Age of Onset   Stroke Mother    Heart disease Mother    Osteoarthritis Mother    Arthritis Mother    Heart disease Father    Hypertension Father    Heart disease Paternal Uncle    Cancer Paternal Uncle        Lung   Past Surgical History:  Procedure  Laterality Date   CATARACT EXTRACTION, BILATERAL     EYE SURGERY  2022   remove cataracts   KNEE ARTHROSCOPY W/ MENISCECTOMY Left    Social History   Social History Narrative   Caffiene rare.   Working retired.     Immunization History  Administered Date(s) Administered   Fluad Quad(high Dose 65+) 12/04/2018   Influenza Split 11/27/2020, 12/06/2021, 12/15/2022   Influenza, High Dose Seasonal PF 12/24/2017   PFIZER Comirnaty(Gray Top)Covid-19 Tri-Sucrose Vaccine 03/24/2019, 04/14/2019, 12/15/2019, 06/14/2020   Pfizer Covid-19 Vaccine Bivalent Booster 79yrs & up 12/09/2020, 12/06/2021, 12/15/2022   Pneumococcal Conjugate-13 11/29/2018   Pneumococcal Polysaccharide-23 08/12/2020   Tdap 03/26/2008, 10/07/2018   Zoster Recombinant(Shingrix) 11/26/2017, 02/05/2018   Zoster, Live 11/23/2011, 12/14/2011     Objective: Vital Signs: BP 108/61 (  BP Location: Left Arm, Patient Position: Sitting, Cuff Size: Normal)   Pulse 62   Resp 16   Ht 5' 3 (1.6 m)   Wt 116 lb (52.6 kg)   BMI 20.55 kg/m    Physical Exam HENT:     Mouth/Throat:     Mouth: Mucous membranes are dry.     Pharynx: Oropharynx is clear.  Eyes:     Conjunctiva/sclera: Conjunctivae normal.  Cardiovascular:     Rate and Rhythm: Normal rate and regular rhythm.  Pulmonary:     Effort: Pulmonary effort is normal.     Breath sounds: Normal breath sounds.  Musculoskeletal:     Right lower leg: No edema.     Left lower leg: No edema.  Lymphadenopathy:     Cervical: No cervical adenopathy.  Skin:    General: Skin is warm and dry.     Findings: No rash.  Neurological:     Mental Status: She is alert.  Psychiatric:        Mood and Affect: Mood normal.      Musculoskeletal Exam:  Shoulders full ROM no tenderness or swelling Elbows full ROM no tenderness or swelling Wrists full ROM no swelling Fingers extensive heberdon's nodes on both hands, right hand 2nd 4th PIP with probable slight synovitis and are tender to  pressure, lateral deviations in DIPs Knees full ROM no tenderness or swelling    Investigation: No additional findings.  Imaging: US  ABDOMEN LIMITED RUQ (LIVER/GB) Result Date: 10/03/2023 CLINICAL DATA:  Hepatic cyst found incidentally on Chest CT EXAM: ULTRASOUND ABDOMEN LIMITED RIGHT UPPER QUADRANT COMPARISON:  May 17, 2023 FINDINGS: Gallbladder: No gallstones or wall thickening visualized. Small amount of sludge is noted. No sonographic Murphy sign noted by sonographer. Common bile duct: Diameter: Visualized portion measures 8 mm, mildly dilated. Liver: Benign cyst is noted along the hepatic dome measuring 16 x 16 x 15 mm; this likely corresponds to the mass of recent CT concern. Within normal limits in parenchymal echogenicity. Portal vein is patent on color Doppler imaging with normal direction of blood flow towards the liver. Other: None. IMPRESSION: 1. Benign cyst is noted along the hepatic dome measuring up to 16 mm; this likely corresponds to the mass of recent CT concern. 2. Mild dilation of the common bile duct measuring up to 8 mm. Recommend correlation with liver function tests. If there is clinical concern for biliary obstruction, recommend further evaluation with MRCP. Electronically Signed   By: Corean Salter M.D.   On: 10/03/2023 17:17    Recent Labs: Lab Results  Component Value Date   WBC 6.0 04/12/2023   HGB 13.1 04/12/2023   PLT 370 04/12/2023   NA 132 (L) 04/12/2023   K 4.4 04/12/2023   CL 96 (L) 04/12/2023   CO2 20 04/12/2023   GLUCOSE 83 04/12/2023   BUN 19 04/12/2023   CREATININE 0.82 04/12/2023   BILITOT 0.4 10/05/2021   ALKPHOS 51 05/05/2016   AST 20 10/05/2021   ALT 16 10/05/2021   PROT 6.6 10/05/2021   ALBUMIN 4.5 05/05/2016   CALCIUM  10.2 04/12/2023    Speciality Comments: PLQ EYE EXAM 03/13/2022 normal [facility] f/u 1 year  Patient has an appointment scheduled in 01/2024, will keep calling for cancellations  Procedures:  No  procedures performed Allergies: Clindamycin/lincomycin   Assessment / Plan:     Visit Diagnoses: Sjogren's syndrome without extraglandular involvement (HCC) - cevimeline  30 mg 3 times daily Symptoms appear to be stable no  new peripheral joint inflammatory changes skin rashes sialoadenitis or adenopathy reported.  We discussed SSA negativity as a classification criteria though does not exclude disease and continue symptomatic management as before. - Rechecking sed rate CRP and serum complements for disease activity assessment - Continue hydroxychloroquine  200 mg daily - Continue cevimeline  30 mg 3 times daily - Continue Restasis  0.05% twice daily and Systane drops as needed  High risk medication use - hydroxychloroquine  200 mg daily. 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 Presence of Heberden's nodes indicating bone spurring along joint margins. Current symptoms include swelling and compressibility in the affected joints, suggesting an inflammatory component. The condition is chronic and degenerative, with limited treatment options available. - Recommend NSAIDs such as ibuprofen  for pain management. - Suggest topical application of Voltaren  gel for localized relief. - Encourage use of heat therapy, such as hot paraffin wax baths or heated Roylee Chaffin/sand devices, to alleviate pain and improve joint mobility.   Multifactorial low back pain - On gabapentin  and duloxetine  maintenance.   Orders: No orders of the defined types were placed in this encounter.  No orders of the defined types were placed in this encounter.    Follow-Up  Instructions: No follow-ups on file.   Lonni LELON Ester, MD  Note - This record has been created using [employer].  Chart creation errors have been sought, but may not always  have been located. Such creation errors do not reflect on  the standard of medical care.

## 2023-10-03 ENCOUNTER — Ambulatory Visit: Payer: Self-pay | Admitting: Family Medicine

## 2023-10-03 DIAGNOSIS — K838 Other specified diseases of biliary tract: Secondary | ICD-10-CM

## 2023-10-10 ENCOUNTER — Ambulatory Visit: Payer: Medicare Other | Attending: Internal Medicine | Admitting: Internal Medicine

## 2023-10-10 ENCOUNTER — Encounter: Payer: Self-pay | Admitting: Internal Medicine

## 2023-10-10 VITALS — BP 108/61 | HR 62 | Resp 16 | Ht 63.0 in | Wt 116.0 lb

## 2023-10-10 DIAGNOSIS — M7918 Myalgia, other site: Secondary | ICD-10-CM | POA: Diagnosis present

## 2023-10-10 DIAGNOSIS — M19042 Primary osteoarthritis, left hand: Secondary | ICD-10-CM | POA: Insufficient documentation

## 2023-10-10 DIAGNOSIS — M47816 Spondylosis without myelopathy or radiculopathy, lumbar region: Secondary | ICD-10-CM | POA: Insufficient documentation

## 2023-10-10 DIAGNOSIS — M3500 Sicca syndrome, unspecified: Secondary | ICD-10-CM | POA: Diagnosis present

## 2023-10-10 DIAGNOSIS — M19041 Primary osteoarthritis, right hand: Secondary | ICD-10-CM | POA: Diagnosis present

## 2023-10-10 DIAGNOSIS — H04123 Dry eye syndrome of bilateral lacrimal glands: Secondary | ICD-10-CM | POA: Diagnosis present

## 2023-10-10 DIAGNOSIS — M1990 Unspecified osteoarthritis, unspecified site: Secondary | ICD-10-CM | POA: Insufficient documentation

## 2023-10-10 DIAGNOSIS — G8929 Other chronic pain: Secondary | ICD-10-CM | POA: Insufficient documentation

## 2023-10-10 DIAGNOSIS — Z79899 Other long term (current) drug therapy: Secondary | ICD-10-CM | POA: Insufficient documentation

## 2023-10-10 MED ORDER — CEVIMELINE HCL 30 MG PO CAPS: 30.0000 mg | ORAL_CAPSULE | Freq: Three times a day (TID) | ORAL | 1 refills | Status: AC

## 2023-10-10 MED ORDER — CYCLOSPORINE 0.05 % OP EMUL: 1.0000 [drp] | Freq: Two times a day (BID) | OPHTHALMIC | 1 refills | Status: AC

## 2023-10-10 MED ORDER — HYDROXYCHLOROQUINE SULFATE 200 MG PO TABS
200.0000 mg | ORAL_TABLET | Freq: Every day | ORAL | 1 refills | Status: AC
Start: 2023-10-10 — End: ?

## 2023-10-11 LAB — COMPREHENSIVE METABOLIC PANEL WITH GFR
AG Ratio: 2.2 (calc) (ref 1.0–2.5)
ALT: 13 U/L (ref 6–29)
AST: 22 U/L (ref 10–35)
Albumin: 4.3 g/dL (ref 3.6–5.1)
Alkaline phosphatase (APISO): 39 U/L (ref 37–153)
BUN: 24 mg/dL (ref 7–25)
CO2: 28 mmol/L (ref 20–32)
Calcium: 9.9 mg/dL (ref 8.6–10.4)
Chloride: 101 mmol/L (ref 98–110)
Creat: 0.82 mg/dL (ref 0.60–1.00)
Globulin: 2 g/dL (ref 1.9–3.7)
Glucose, Bld: 81 mg/dL (ref 65–99)
Potassium: 4.5 mmol/L (ref 3.5–5.3)
Sodium: 137 mmol/L (ref 135–146)
Total Bilirubin: 0.4 mg/dL (ref 0.2–1.2)
Total Protein: 6.3 g/dL (ref 6.1–8.1)
eGFR: 76 mL/min/1.73m2 (ref >=60)

## 2023-10-11 LAB — CBC WITH DIFFERENTIAL/PLATELET
Absolute Lymphocytes: 1234 {cells}/uL (ref 850–3900)
Absolute Monocytes: 403 {cells}/uL (ref 200–950)
Basophils Absolute: 20 {cells}/uL (ref 0–200)
Basophils Relative: 0.3 %
Eosinophils Absolute: 59 {cells}/uL (ref 15–500)
Eosinophils Relative: 0.9 %
HCT: 38 % (ref 35.0–45.0)
Hemoglobin: 12.2 g/dL (ref 11.7–15.5)
MCH: 30.2 pg (ref 27.0–33.0)
MCHC: 32.1 g/dL (ref 32.0–36.0)
MCV: 94.1 fL (ref 80.0–100.0)
MPV: 9.5 fL (ref 7.5–12.5)
Monocytes Relative: 6.1 %
Neutro Abs: 4884 {cells}/uL (ref 1500–7800)
Neutrophils Relative %: 74 %
Platelets: 322 Thousand/uL (ref 140–400)
RBC: 4.04 Million/uL (ref 3.80–5.10)
RDW: 12.2 % (ref 11.0–15.0)
Total Lymphocyte: 18.7 %
WBC: 6.6 Thousand/uL (ref 3.8–10.8)

## 2023-10-11 LAB — SEDIMENTATION RATE: Sed Rate: 11 mm/h (ref 0–30)

## 2023-10-11 LAB — C-REACTIVE PROTEIN: CRP: <3 mg/L (ref <8.0)

## 2023-10-11 LAB — C3 AND C4
C3 Complement: 110 mg/dL (ref 83–193)
C4 Complement: 20 mg/dL (ref 15–57)

## 2023-10-15 ENCOUNTER — Ambulatory Visit: Payer: Self-pay | Admitting: Family Medicine

## 2023-10-15 ENCOUNTER — Other Ambulatory Visit (INDEPENDENT_AMBULATORY_CARE_PROVIDER_SITE_OTHER)

## 2023-10-15 DIAGNOSIS — K838 Other specified diseases of biliary tract: Secondary | ICD-10-CM | POA: Diagnosis not present

## 2023-10-15 LAB — HEPATIC FUNCTION PANEL
ALT: 13 U/L (ref 0–35)
AST: 20 U/L (ref 0–37)
Albumin: 4.4 g/dL (ref 3.5–5.2)
Alkaline Phosphatase: 38 U/L — ABNORMAL LOW (ref 39–117)
Bilirubin, Direct: 0.1 mg/dL (ref 0.0–0.3)
Total Bilirubin: 0.4 mg/dL (ref 0.2–1.2)
Total Protein: 6.6 g/dL (ref 6.0–8.3)

## 2023-10-17 ENCOUNTER — Ambulatory Visit: Payer: Self-pay

## 2023-10-17 NOTE — Telephone Encounter (Signed)
 FYI Only or Action Required?: FYI only for provider.  Patient was last seen in primary care on 08/22/2023 by Thedora Garnette HERO, MD.  Called Nurse Triage reporting Constipation.  Symptoms began today.  Interventions attempted: OTC medications: Miralax.  Symptoms are: stable.  Triage Disposition: See PCP Within 2 Weeks  Patient/caregiver understands and will follow disposition?: Yes               Copied from CRM #[id number]. Topic: Clinical - Red Word Triage >> Oct 17, 2023 12:48 PM Shereese L wrote: [facility] that prompted transfer to Nurse Triage: Abdominal pain, high blood pressure, nasuea, constipated and can't get the enama in Reason for Disposition  [1] Minor bleeding from rectum (e.g., blood just on toilet paper, few drops, streaks on surface of normal formed BM) AND [2] 3 or more times  Answer Assessment - Initial Assessment Questions Pt unable to get Enema into rectum. Currently feeling fatigue with some nausea. Patient's husband states her BP is elevated, 138/85 when checked at home. Took miralax at 0830 this morning. Patient states the abdominal pain feels like period cramps.       1. STOOL PATTERN OR FREQUENCY: How often do you have a bowel movement (BM)?  (Normal range: 3 times a day to every 3 days)  When was your last BM?       Normal BM every morning  2. STRAINING: Do you have to strain to have a BM?      Yes, staring all morning  3. ONSET: When did the constipation begin?     This morning 4. RECTAL PAIN: Does your rectum hurt when the stool comes out? If Yes, ask: Do you have hemorrhoids? How bad is the pain?  (Scale 1-10; or mild, moderate, severe)     Moderate- severe 5. BM COMPOSITION: Are the stools hard?      Yes 6. BLOOD ON STOOLS: Has there been any blood on the toilet tissue or on the surface of the BM? If Yes, ask: When was the last time?     Blood is bright red mixed in with feces and when wiping  7. CHRONIC CONSTIPATION: Is  this a new problem for you?  If No, ask: How long have you had this problem? (days, weeks, months)      Has issue periodically  8. CHANGES IN DIET OR HYDRATION: Have there been any recent changes in your diet? How much fluids are you drinking on a daily basis?  How much have you had to drink today?     Staying hydrated  9. MEDICINES: Have you been taking any new medicines? Are you taking any narcotic pain medicines? (e.g., Dilaudid, morphine, Percocet, Vicodin)     No 10. LAXATIVES: Have you been using any stool softeners, laxatives, or enemas?  If Yes, ask What are you using, how often, and when was the last time?       Attepted enema, took miralax 13. MEDICAL HISTORY: Do you have a history of hemorrhoids, rectal fissures, rectal surgery, or rectal abscess?         Yes hx of Fissures, states hemorrhoids are not bad  14. OTHER SYMPTOMS: Do you have any other symptoms? (e.g., abdomen pain, bloating, fever, vomiting)       Vomited once this morning and has some abdominal pain.  Protocols used: Constipation-A-AH

## 2023-10-18 ENCOUNTER — Encounter: Payer: Self-pay | Admitting: Family Medicine

## 2023-10-18 ENCOUNTER — Ambulatory Visit (INDEPENDENT_AMBULATORY_CARE_PROVIDER_SITE_OTHER): Admitting: Family Medicine

## 2023-10-18 VITALS — BP 106/70 | HR 68 | Temp 97.8°F | Ht 63.0 in | Wt 110.0 lb

## 2023-10-18 DIAGNOSIS — K602 Anal fissure, unspecified: Secondary | ICD-10-CM | POA: Diagnosis not present

## 2023-10-18 MED ORDER — HYDROCORTISONE ACE-PRAMOXINE 1-1 % EX CREA: 1.0000 | TOPICAL_CREAM | Freq: Two times a day (BID) | CUTANEOUS | 0 refills | Status: AC

## 2023-10-18 MED ORDER — DOCUSATE SODIUM 100 MG PO CAPS: 100.0000 mg | ORAL_CAPSULE | Freq: Two times a day (BID) | ORAL | 0 refills | Status: AC

## 2023-10-18 NOTE — Progress Notes (Signed)
 Established Patient Office Visit   Subjective:  Patient ID: Kathleen [name], female    DOB: [birth year]  Age: 73 y.o. MRN: [id number]  Chief Complaint  Patient presents with   Constipation    Constipation x 2 days. Pt last bowel movement was 1 day ago. Was painful with some bleeding. Pt states she also have a anal fissure. Pt has a Hx of Hemorrhoids    Constipation Pertinent negatives include no abdominal pain or melena.   Encounter Diagnoses  Name Primary?   Anal fissure Yes   History of ongoing constipation.  Currently uses MiraLAX about 4 times weekly.  Had an extremely painful bowel movement yesterday that was associated with an episode of vomiting.  No further nausea or vomiting, fever chills or abdominal pain.  History of anal fissure.  Has noted some blood with wiping   Review of Systems  Constitutional: Negative.   HENT: Negative.    Eyes:  Negative for blurred vision, discharge and redness.  Respiratory: Negative.    Cardiovascular: Negative.   Gastrointestinal:  Positive for constipation. Negative for abdominal pain, blood in stool and melena.  Genitourinary: Negative.   Musculoskeletal: Negative.  Negative for myalgias.  Skin:  Negative for rash.  Neurological:  Negative for tingling, loss of consciousness and weakness.  Endo/Heme/Allergies:  Negative for polydipsia.     Current Outpatient Medications:    acetaminophen  (TYLENOL ) 325 MG tablet, Take 650 mg by mouth every 6 (six) hours as needed., Disp: , Rfl:    Alpha-Lipoic Acid 300 MG CAPS, , Disp: , Rfl:    amLODipine  (NORVASC ) 5 MG tablet, TAKE 1 TABLET (5 MG TOTAL) BY MOUTH DAILY., Disp: 90 tablet, Rfl: 3   Ascorbic Acid (VITAMIN C) 100 MG tablet, Take 100 mg by mouth daily., Disp: , Rfl:    budesonide  ([employer] BUDESONIDE ) 32 MCG/ACT nasal spray, Place 1 spray into both nostrils daily., Disp: 8.43 mL, Rfl: 5   Calcium  Carbonate-Vit D-Min (CALTRATE 600+D PLUS MINERALS PO), Take 1 tablet by mouth daily., Disp: ,  Rfl:    cevimeline  (EVOXAC ) 30 MG capsule, Take 1 capsule (30 mg total) by mouth 3 (three) times daily., Disp: 270 capsule, Rfl: 1   cycloSPORINE  (RESTASIS ) 0.05 % ophthalmic emulsion, Place 1 drop into both eyes 2 (two) times daily., Disp: 90 each, Rfl: 1   diazepam  (VALIUM ) 5 MG tablet, Take 1 tablet (5 mg total) by mouth every 6 (six) hours as needed for anxiety., Disp: 2 tablet, Rfl: 0   diclofenac  Sodium (VOLTAREN ) 1 % GEL, Apply 2 g topically., Disp: , Rfl:    docusate sodium  (COLACE) 100 MG capsule, Take 1 capsule (100 mg total) by mouth 2 (two) times daily., Disp: 60 capsule, Rfl: 0   DULoxetine  (CYMBALTA ) 30 MG capsule, TAKE 1 CAPSULE BY MOUTH EVERY DAY, Disp: 90 capsule, Rfl: 3   fexofenadine (ALLEGRA) 180 MG tablet, Take 180 mg by mouth daily., Disp: , Rfl:    gabapentin  (NEURONTIN ) 100 MG capsule, TAKE 2 CAPSULES (200 MG TOTAL) BY MOUTH 2 TIMES DAILY., Disp: 360 capsule, Rfl: 3   gabapentin  (NEURONTIN ) 600 MG tablet, TAKE 1 TABLET BY MOUTH EVERY DAY, Disp: 90 tablet, Rfl: 3   hydroxychloroquine  (PLAQUENIL ) 200 MG tablet, Take 1 tablet (200 mg total) by mouth daily., Disp: 90 tablet, Rfl: 1   IMVEXXY MAINTENANCE PACK 4 MCG INST, SMARTSIG:1 Vaginal Twice a Week, Disp: , Rfl:    levothyroxine  (SYNTHROID ) 75 MCG tablet, TAKE 1 TABLET BY MOUTH DAILY BEFORE BREAKFAST., Disp:  90 tablet, Rfl: 3   Melatonin 5 MG CAPS, Take 1 tablet by mouth at bedtime., Disp: , Rfl:    meloxicam  (MOBIC ) 15 MG tablet, Take 1 tablet (15 mg total) by mouth daily., Disp: 90 tablet, Rfl: 3   Menthol, Topical Analgesic, 4 % GEL, Apply topically., Disp: , Rfl:    montelukast  (SINGULAIR ) 10 MG tablet, TAKE 1 TABLET BY MOUTH EVERYDAY AT BEDTIME, Disp: 90 tablet, Rfl: 1   Omega-3 Fatty Acids (FISH OIL) 1000 MG CAPS, Take by mouth., Disp: , Rfl:    omeprazole  (PRILOSEC) 20 MG capsule, Take 1 capsule (20 mg total) by mouth daily., Disp: 90 capsule, Rfl: 3   polyethylene glycol (MIRALAX) 0.34 gm/ml SOLN, , Disp: , Rfl:     pramoxine-hydrocortisone  (PROCTOCREAM-HC) 1-1 % rectal cream, Place 1 Application rectally 2 (two) times daily., Disp: 30 g, Rfl: 0   Propylene Glycol (SYSTANE BALANCE OP), Apply to eye., Disp: , Rfl:    rosuvastatin  (CRESTOR ) 5 MG tablet, Take 1 tablet (5 mg total) by mouth daily., Disp: 90 tablet, Rfl: 3   SODIUM FLUORIDE 5000 PPM 1.1 % GEL dental gel, at bedtime., Disp: , Rfl:    traMADol  (ULTRAM ) 50 MG tablet, TAKE 1 TABLET BY MOUTH EVERY 12 HOURS AS NEEDED., Disp: 60 tablet, Rfl: 0   triamcinolone  cream (KENALOG ) 0.1 %, APPLY TO OUTER EYELID SKIN TWICE DAILY FOR 1 WEEK., Disp: , Rfl:    zoledronic  acid (RECLAST ) 5 MG/100ML SOLN injection, Inject 5 mg into the vein once. Yearly infusion, Disp: , Rfl:    zolpidem  (AMBIEN ) 5 MG tablet, TAKE 1/2 TO 1 TABLET BY MOUTH NIGHTLY AS NEEDED FOR SLEEP, Disp: 15 tablet, Rfl: 2  Current Facility-Administered Medications:    lidocaine  (XYLOCAINE ) 1 % (with pres) injection 10 mL, 10 mL, Other, Once, Kirsteins, Prentice BRAVO, MD   lidocaine  (XYLOCAINE ) 1 % (with pres) injection 10 mL, 10 mL, Other, Once,    lidocaine  HCl (PF) (XYLOCAINE ) 2 % injection 3 mL, 3 mL, Other, Once,    lidocaine  HCl (PF) (XYLOCAINE ) 2 % injection 5 mL, 5 mL, Other, Once, Kirsteins, Prentice BRAVO, MD   Objective:     BP 106/70 (BP Location: Right Arm, Patient Position: Sitting, Cuff Size: Normal)   Pulse 68   Temp 97.8 F (36.6 C) (Temporal)   Ht 5' 3 (1.6 m)   Wt 110 lb (49.9 kg)   SpO2 97%   BMI 19.49 kg/m    Physical Exam Constitutional:      General: She is not in acute distress.    Appearance: Normal appearance. She is not ill-appearing, toxic-appearing or diaphoretic.  HENT:     Head: Normocephalic and atraumatic.     Right Ear: External ear normal.     Left Ear: External ear normal.  Eyes:     General: No scleral icterus.       Right eye: No discharge.        Left eye: No discharge.     Extraocular Movements: Extraocular movements intact.     Conjunctiva/sclera:  Conjunctivae normal.  Pulmonary:     Effort: Pulmonary effort is normal. No respiratory distress.  Abdominal:     General: Abdomen is flat. Bowel sounds are normal.     Palpations: Abdomen is soft.     Tenderness: There is no abdominal tenderness.  Genitourinary:    Comments: Patient allowing limited rectal exam with only a look and see.  There is no bleeding.  There are 3 anal tags.  There may be a small tear at the 9 o'clock position. Skin:    General: Skin is warm and dry.  Neurological:     Mental Status: She is alert and oriented to person, place, and time.  Psychiatric:        Mood and Affect: Mood normal.        Behavior: Behavior normal.      No results found for any visits on 10/18/23.    The 10-year ASCVD risk score (Arnett DK, et al., 2019) is: 9.7%    Assessment & Plan:   Anal fissure -     Hydrocortisone  Ace-Pramoxine; Place 1 Application rectally 2 (two) times daily.  Dispense: 30 g; Refill: 0 -     Docusate Sodium ; Take 1 capsule (100 mg total) by mouth 2 (two) times daily.  Dispense: 60 capsule; Refill: 0    Return Use MiraLAX daily.  Sitz bath's daily.  Schedule follow-up with Dr.Rudd/gastroenterology.  Continue MiraLAX daily.  Colace twice daily.  Rectal cream twice daily.  Sitz bath's daily.   Kathleen Sim Lent, MD

## 2023-10-30 ENCOUNTER — Encounter: Payer: Self-pay | Admitting: Sports Medicine

## 2023-11-07 ENCOUNTER — Other Ambulatory Visit: Payer: Self-pay | Admitting: Physical Medicine & Rehabilitation

## 2023-11-08 NOTE — Telephone Encounter (Signed)
 PMP was Reviewed.  Call placed to Kathleen [name] , she tkes her Tramadol  sparingly.  Tramadol  e- scribed today, she has a scheduled appointment with Dr Carilyn in October 2025.

## 2023-11-14 ENCOUNTER — Other Ambulatory Visit: Payer: Self-pay | Admitting: Family Medicine

## 2023-11-14 DIAGNOSIS — K602 Anal fissure, unspecified: Secondary | ICD-10-CM

## 2023-11-23 ENCOUNTER — Encounter: Payer: Self-pay | Admitting: Family Medicine

## 2023-11-23 ENCOUNTER — Ambulatory Visit (INDEPENDENT_AMBULATORY_CARE_PROVIDER_SITE_OTHER): Admitting: Family Medicine

## 2023-11-23 VITALS — BP 122/68 | HR 61 | Temp 97.8°F | Ht 63.0 in | Wt 113.6 lb

## 2023-11-23 DIAGNOSIS — I10 Essential (primary) hypertension: Secondary | ICD-10-CM

## 2023-11-23 DIAGNOSIS — E782 Mixed hyperlipidemia: Secondary | ICD-10-CM | POA: Diagnosis not present

## 2023-11-23 DIAGNOSIS — M19041 Primary osteoarthritis, right hand: Secondary | ICD-10-CM | POA: Diagnosis not present

## 2023-11-23 DIAGNOSIS — M19042 Primary osteoarthritis, left hand: Secondary | ICD-10-CM

## 2023-11-23 DIAGNOSIS — E039 Hypothyroidism, unspecified: Secondary | ICD-10-CM | POA: Diagnosis not present

## 2023-11-23 NOTE — Progress Notes (Signed)
 [facility] [address] [place] [address] Dept: [phone] Dept Fax: [phone]  Chronic Care Office Visit  Subjective:    Patient ID: Kathleen [name], female    DOB: 1950/04/27, 73 y.o..   MRN: [id number]  Chief Complaint  Patient presents with   Hypertension    3 month f/u HTN.  No concerns.  Declines flu shot.    History of Present Illness:  Patient is in today for reassessment of chronic medical issues.  Kathleen [name] has a history of  hypertension.  She is managed on amlodipine  5 mg daily.    Kathleen [name] has a history of hypothyroidism. She is managed on levothyroxine  75 mcg daily.   Kathleen [name] has hyperlipidemia and an elevated coronary artery calcium  score. She is managed on atorvastatin  10 mg daily. She denies any obvious side effects.   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 She is managed on hydroxychloroquine  and several medicines focused on dry eyes and dry mouth for the Sjgren's. She also takes meloxicam  15 mg daily, duloxetine  30 mg daily, and gabapentin  with occasional tramadol  for pain management. She admits that she struggles with some activities, esp. related to her grip. This impacts activities such as cooking, but also outdoor activities like hiking with the use of hiking sticks. She has [name] engaged with physical therapy.  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   Essential hypertension 02/27/2021   Chronic right sacroiliac pain 09/09/2020   Osteoporosis 11/29/2018    Piriformis syndrome of right side 10/23/2017   Spondylosis without myelopathy or radiculopathy, 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   Chronic myofascial pain 04/02/2017   ANA positive 01/25/2017   High risk medication use 05/30/2016   Xerostomia 05/03/2015   GERD (gastroesophageal reflux disease) 04/01/2015   IBS (irritable bowel syndrome) 04/01/2015   Insomnia 04/01/2015   Chronic seasonal allergic rhinitis due to pollen 02/04/2015   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   Tinnitus 02/04/2015   Multifactorial low back pain 12/15/2014   Personal history of other malignant neoplasm of skin 12/08/2011   Chronic back pain 08/24/2011   Hypothyroidism 02/28/1995   Past Surgical History:  Procedure Laterality Date   CATARACT EXTRACTION, BILATERAL     EYE SURGERY  2022   remove cataracts   KNEE ARTHROSCOPY W/ MENISCECTOMY Left    Family History  Problem Relation Age of Onset   Stroke Mother    Heart disease Mother    Osteoarthritis Mother    Arthritis Mother    Heart disease Father    Hypertension Father    Heart disease Paternal Uncle    Cancer Paternal Uncle        Lung   Outpatient Medications Prior to Visit  Medication Sig Dispense Refill   acetaminophen  (TYLENOL ) 325 MG tablet Take 650 mg by mouth every 6 (six) hours as needed.     Alpha-Lipoic Acid 300 MG CAPS      amLODipine  (NORVASC ) 5 MG tablet TAKE 1 TABLET (5 MG TOTAL) BY MOUTH DAILY. 90 tablet 3   Ascorbic Acid (VITAMIN C)  100 MG tablet Take 100 mg by mouth daily.     budesonide  ([employer] BUDESONIDE ) 32 MCG/ACT nasal spray Place 1 spray into both nostrils daily. 8.43 mL 5   Calcium  Carbonate-Vit D-Min (CALTRATE 600+D PLUS MINERALS PO) Take 1 tablet by mouth daily.     cevimeline  (EVOXAC ) 30 MG capsule Take 1 capsule (30 mg total) by mouth 3 (three) times daily. 270 capsule 1   cycloSPORINE  (RESTASIS ) 0.05 % ophthalmic emulsion Place 1 drop into both eyes 2 (two) times daily. 90 each 1   diazepam  (VALIUM ) 5 MG tablet  Take 1 tablet (5 mg total) by mouth every 6 (six) hours as needed for anxiety. 2 tablet 0   diclofenac  Sodium (VOLTAREN ) 1 % GEL Apply 2 g topically.     docusate sodium  (COLACE) 100 MG capsule Take 1 capsule (100 mg total) by mouth 2 (two) times daily. 60 capsule 0   DULoxetine  (CYMBALTA ) 30 MG capsule TAKE 1 CAPSULE BY MOUTH EVERY DAY 90 capsule 3   fexofenadine (ALLEGRA) 180 MG tablet Take 180 mg by mouth daily.     gabapentin  (NEURONTIN ) 100 MG capsule TAKE 2 CAPSULES (200 MG TOTAL) BY MOUTH 2 TIMES DAILY. 360 capsule 3   gabapentin  (NEURONTIN ) 600 MG tablet TAKE 1 TABLET BY MOUTH EVERY DAY 90 tablet 3   hydroxychloroquine  (PLAQUENIL ) 200 MG tablet Take 1 tablet (200 mg total) by mouth daily. 90 tablet 1   IMVEXXY MAINTENANCE PACK 4 MCG INST SMARTSIG:1 Vaginal Twice a Week     levothyroxine  (SYNTHROID ) 75 MCG tablet TAKE 1 TABLET BY MOUTH DAILY BEFORE BREAKFAST. 90 tablet 3   Melatonin 5 MG CAPS Take 1 tablet by mouth at bedtime.     meloxicam  (MOBIC ) 15 MG tablet Take 1 tablet (15 mg total) by mouth daily. 90 tablet 3   montelukast  (SINGULAIR ) 10 MG tablet TAKE 1 TABLET BY MOUTH EVERYDAY AT BEDTIME 90 tablet 1   Omega-3 Fatty Acids (FISH OIL) 1000 MG CAPS Take by mouth.     omeprazole  (PRILOSEC) 20 MG capsule Take 1 capsule (20 mg total) by mouth daily. 90 capsule 3   polyethylene glycol (MIRALAX) 0.34 gm/ml SOLN      Propylene Glycol (SYSTANE BALANCE OP) Apply to eye.     rosuvastatin  (CRESTOR ) 5 MG tablet Take 1 tablet (5 mg total) by mouth daily. 90 tablet 3   SODIUM FLUORIDE 5000 PPM 1.1 % GEL dental gel at bedtime.     traMADol  (ULTRAM ) 50 MG tablet TAKE 1 TABLET BY MOUTH EVERY 12 HOURS AS NEEDED 46 tablet 1   zoledronic  acid (RECLAST ) 5 MG/100ML SOLN injection Inject 5 mg into the vein once. Yearly infusion     zolpidem  (AMBIEN ) 5 MG tablet TAKE 1/2 TO 1 TABLET BY MOUTH NIGHTLY AS NEEDED FOR SLEEP 15 tablet 2   Menthol, Topical Analgesic, 4 % GEL Apply topically. (Patient not taking:  Reported on 11/23/2023)     pramoxine-hydrocortisone  (PROCTOCREAM-HC) 1-1 % rectal cream Place 1 Application rectally 2 (two) times daily. (Patient not taking: Reported on 11/23/2023) 30 g 0   triamcinolone  cream (KENALOG ) 0.1 % APPLY TO OUTER EYELID SKIN TWICE DAILY FOR 1 WEEK. (Patient not taking: Reported on 11/23/2023)     Facility-Administered Medications Prior to Visit  Medication Dose Route Frequency Provider Last Rate Last Admin   lidocaine  (XYLOCAINE ) 1 % (with pres) injection 10 mL  10 mL Other Once Kirsteins, Prentice BRAVO, MD       lidocaine  (XYLOCAINE ) 1 % (with pres)  injection 10 mL  10 mL Other Once        lidocaine  HCl (PF) (XYLOCAINE ) 2 % injection 3 mL  3 mL Other Once        lidocaine  HCl (PF) (XYLOCAINE ) 2 % injection 5 mL  5 mL Other Once Kirsteins, Prentice BRAVO, MD       Allergies  Allergen Reactions   Clindamycin/Lincomycin Nausea Only   Objective:   Today's Vitals   11/23/23 1035  BP: 122/68  Pulse: 61  Temp: 97.8 F (36.6 C)  TempSrc: Temporal  SpO2: 99%  Weight: 113 lb 9.6 oz (51.5 kg)  Height: 5' 3 (1.6 m)   Body mass index is 20.12 kg/m.   General: Well developed, well nourished. No acute distress. Extremities: Right 2nd-4th finger with joint deformities of the DIP and PIP joints with moderate swelling. Psych: Alert and oriented. Normal mood and affect.  Health Maintenance Due  Topic Date Due   Mammogram  01/18/2024     Assessment & Plan:   Problem List Items Addressed This Visit       Cardiovascular and Mediastinum   Essential hypertension - Primary   Blood pressure is in good control. Continue amlodipine  5 mg.        Endocrine   Hypothyroidism   I will reassess her TSH level. Continue levothyroxine  75 mcg daily.      Relevant Orders   TSH     Musculoskeletal and Integument   Osteoarthritis of hands, bilateral   Continue to manage with Dr. Jeannetta (rheumatology) and Dr. Carilyn (physiatry). I will refer her to OT to see if they can help her  determine accommodations that can help her maintain her IADLs and other activities.      Relevant Orders   Ambulatory referral to Occupational Therapy     Other   Hyperlipidemia   I will reassess lipids. Continue atorvastatin  10 mg daily.      Relevant Orders   Lipid panel    Return in about 4 months (around 03/24/2024) for Reassessment.   Garnette CHRISTELLA Simpler, MD

## 2023-11-23 NOTE — Assessment & Plan Note (Signed)
 I will reassess lipids. Continue atorvastatin  10 mg daily.

## 2023-11-23 NOTE — Assessment & Plan Note (Signed)
 Continue to manage with Dr. Jeannetta (rheumatology) and Dr. Carilyn (physiatry). I will refer her to OT to see if they can help her determine accommodations that can help her maintain her IADLs and other activities.

## 2023-11-23 NOTE — Assessment & Plan Note (Signed)
 Blood pressure is in good control. Continue amlodipine 5 mg.

## 2023-11-23 NOTE — Assessment & Plan Note (Signed)
 I will reassess her TSH level. Continue levothyroxine  75 mcg daily.

## 2023-11-26 ENCOUNTER — Other Ambulatory Visit (INDEPENDENT_AMBULATORY_CARE_PROVIDER_SITE_OTHER)

## 2023-11-26 ENCOUNTER — Encounter: Payer: Self-pay | Admitting: Physical Medicine & Rehabilitation

## 2023-11-26 DIAGNOSIS — E039 Hypothyroidism, unspecified: Secondary | ICD-10-CM

## 2023-11-26 DIAGNOSIS — E782 Mixed hyperlipidemia: Secondary | ICD-10-CM | POA: Diagnosis not present

## 2023-11-26 LAB — LIPID PANEL
Cholesterol: 159 mg/dL (ref 0–200)
HDL: 74.3 mg/dL (ref >39.00)
LDL Cholesterol: 74 mg/dL (ref 0–99)
NonHDL: 85.02
Total CHOL/HDL Ratio: 2
Triglycerides: 53 mg/dL (ref 0.0–149.0)
VLDL: 10.6 mg/dL (ref 0.0–40.0)

## 2023-11-27 ENCOUNTER — Ambulatory Visit: Payer: Self-pay | Admitting: Family Medicine

## 2023-11-27 LAB — TSH: TSH: 1.62 u[IU]/mL (ref 0.35–5.50)

## 2023-12-01 ENCOUNTER — Other Ambulatory Visit: Payer: Self-pay | Admitting: Family Medicine

## 2023-12-01 DIAGNOSIS — M47816 Spondylosis without myelopathy or radiculopathy, lumbar region: Secondary | ICD-10-CM

## 2023-12-04 ENCOUNTER — Ambulatory Visit: Admitting: Occupational Therapy

## 2023-12-06 ENCOUNTER — Ambulatory Visit: Admitting: Podiatry

## 2023-12-11 ENCOUNTER — Ambulatory Visit: Admitting: Podiatry

## 2023-12-11 ENCOUNTER — Ambulatory Visit (INDEPENDENT_AMBULATORY_CARE_PROVIDER_SITE_OTHER): Admitting: Podiatry

## 2023-12-11 ENCOUNTER — Ambulatory Visit (INDEPENDENT_AMBULATORY_CARE_PROVIDER_SITE_OTHER)

## 2023-12-11 VITALS — Ht 63.0 in | Wt 113.6 lb

## 2023-12-11 DIAGNOSIS — S90111A Contusion of right great toe without damage to nail, initial encounter: Secondary | ICD-10-CM | POA: Diagnosis not present

## 2023-12-11 DIAGNOSIS — M79671 Pain in right foot: Secondary | ICD-10-CM

## 2023-12-11 DIAGNOSIS — L03031 Cellulitis of right toe: Secondary | ICD-10-CM

## 2023-12-11 NOTE — Patient Instructions (Signed)
 Place 1/4 cup of epsom salts in a quart of warm tap water.  Submerge your foot or feet in the solution and soak for 20 minutes.  This soak should be done twice a day.  Next, remove your foot or feet from solution, blot dry the affected area. Apply ointment and cover if instructed by your doctor.   IF YOUR SKIN BECOMES IRRITATED WHILE USING THESE INSTRUCTIONS, IT IS OKAY TO SWITCH TO  WHITE VINEGAR AND WATER.  As another alternative soak, you may use antibacterial soap and water.  Monitor for any signs/symptoms of infection. Call the office immediately if any occur or go directly to the emergency room. Call with any questions/concerns.                          Contains text generated by [employer]bridge.                                 Contains text generated by [employer]bridge.

## 2023-12-12 ENCOUNTER — Encounter: Payer: Self-pay | Admitting: Family Medicine

## 2023-12-13 ENCOUNTER — Ambulatory Visit: Attending: Family Medicine | Admitting: Occupational Therapy

## 2023-12-13 DIAGNOSIS — M25641 Stiffness of right hand, not elsewhere classified: Secondary | ICD-10-CM | POA: Diagnosis present

## 2023-12-13 DIAGNOSIS — M25642 Stiffness of left hand, not elsewhere classified: Secondary | ICD-10-CM | POA: Insufficient documentation

## 2023-12-13 DIAGNOSIS — M19042 Primary osteoarthritis, left hand: Secondary | ICD-10-CM | POA: Insufficient documentation

## 2023-12-13 DIAGNOSIS — M6281 Muscle weakness (generalized): Secondary | ICD-10-CM | POA: Insufficient documentation

## 2023-12-13 DIAGNOSIS — M25541 Pain in joints of right hand: Secondary | ICD-10-CM | POA: Insufficient documentation

## 2023-12-13 DIAGNOSIS — M19041 Primary osteoarthritis, right hand: Secondary | ICD-10-CM | POA: Insufficient documentation

## 2023-12-13 DIAGNOSIS — M25542 Pain in joints of left hand: Secondary | ICD-10-CM | POA: Diagnosis present

## 2023-12-13 NOTE — Progress Notes (Signed)
 Subjective:  Patient ID: Shalese Strahan, female    DOB: September 20, 1950,  MRN: [id number]  Chief Complaint  Patient presents with   Foot Pain    RM 4 Stubbed toe on right foot. Very painful, especially when walking. Concerned about toe and toenail.    Discussed the use of AI scribe software for clinical note transcription with the patient, who gave verbal consent to proceed.  History of Present Illness Karyss Frese is a 73 year old female who presents with toe pain and nail issues following a toe injury.  Approximately two weeks ago, she stubbed her second toe, resulting in persistent aching pain and soreness. The pain is exacerbated by walking, particularly when the affected part of the foot is in motion, and extends down into the toe.  She has a history of issues with the toenail on the affected toe, which has been sore intermittently. Following the injury, she noticed a bruise that has mostly resolved, but the pain has persisted. She also experiences difficulty in clipping her nails due to soreness and thickness, affecting both her toenails and fingernails.  She has been buying larger shoes to accommodate her foot, as her second toe is longer and takes the brunt of impact from shoes.  She has no known allergies, but clindamycin upsets her stomach, so she avoids it as an antibiotic.      Objective:    Physical Exam VASCULAR: DP and PT pulse palpable. Foot is warm and well-perfused. Capillary fill time is brisk. DERMATOLOGIC: Normal skin turgor, texture, and temperature. No open lesions, rashes, or ulcerations. NEUROLOGIC: Normal sensation to light touch and pressure. No paresthesias on examination. ORTHOPEDIC: Smooth pain-free range of motion of all examined joints. No ecchymosis or bruising. Hallux valgus deformity with bunion medulla on right foot. Slight digital contractures, ingrown nail, medial border, right second toe with small paronychia. Minimal pain and violation of distal  tuft and phalanx. Possible nondisplaced fracture of distal phalanx. No fracture or dislocation of the second toe. Thickening of nail with infection.   No images are attached to the encounter.    Results RADIOLOGY Foot X-ray: No fracture or dislocation of the second toe. Possible nondisplaced fracture of distal phalanx, difficult to see on all views.    Assessment:   1. Contusion of right great toe without damage to nail, initial encounter   2. Paronychia of second toe, right      Plan:  Patient was evaluated and treated and all questions answered.  Assessment and Plan Assessment & Plan Ingrown toenail with paronychia, right second toe Ingrown toenail with paronychia on the medial border of the right second toe, with a small pocket of pus indicating infection. Likely exacerbated by recent trauma. No evidence of nail fungus, but nail dystrophy noted. - Ingrown nail removed as noted in procedure note below -Nail culture sent to [facility] - Right second toe pain and plantar plate sprain Pain in the right second toe, likely due to inflammation of the joint and ligament (plantar plate) from recent trauma. X-ray shows no fractures, but possible tiny crack under the nail. Ligament is stretched but not torn. - Advise wearing stiff-soled shoes to minimize toe bending - Avoid activities that put pressure on the toe, such as tiptoeing - Recommend using hiking shoes with a good toe box and rocker shape for 2-4 weeks         Procedure: Excision of Ingrown Toenail Location: Right 2nd toe medial nail borders. Anesthesia: Lidocaine  1% plain; 1.5 mL  and Marcaine 0.5% plain; 1.5 mL, digital block. Skin Prep: Betadine. Dressing: Silvadene; telfa; dry, sterile, compression dressing. Technique: Following skin prep, the toe was exsanguinated and a tourniquet was secured at the base of the toe. The affected nail border was freed, split with a nail splitter, and excised.  The tourniquet  was then removed and sterile dressing applied. Disposition: Patient tolerated procedure well.      No follow-ups on file.

## 2023-12-13 NOTE — Therapy (Signed)
 OUTPATIENT OCCUPATIONAL THERAPY ORTHO EVALUATION  Patient Name: Kathleen [name] MRN: [id number] DOB:1950/04/04, 73 y.o., female Today's Date: 12/13/2023  PCP: Dr. Thedora REFERRING PROVIDER: Garnette Thedora MD  END OF SESSION:  OT End of Session - 12/13/23 1037     Visit Number 1    Number of Visits 6    Date for Recertification  01/24/24    Authorization Type Medicare    OT Start Time 1020    OT Stop Time 1058    OT Time Calculation (min) 38 min    Activity Tolerance Patient tolerated treatment well    Behavior During Therapy [facility] for tasks assessed/performed          Past Medical History:  Diagnosis Date   Acid reflux    Allergy    seasonal   Cataract 2019   removed 2022   Hypertension 2022   still watching   Hypothyroid    Osteoarthritis    Sjogren's syndrome    Spondylosis of lumbar spine    Past Surgical History:  Procedure Laterality Date   CATARACT EXTRACTION, BILATERAL     EYE SURGERY  2022   remove cataracts   KNEE ARTHROSCOPY W/ MENISCECTOMY Left    Patient Active Problem List   Diagnosis Date Noted   Hepatic cyst 06/04/2023   Hyperlipidemia 05/15/2023   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   Ganglion cyst of finger of left hand 07/03/2022   Family history of serrated polyposis syndrome (SPS)- Sister 11/01/2021   Dry eye syndrome of both eyes 10/18/2021   Sjogren's syndrome 04/07/2021   Osteoarthritis of hands, bilateral 04/07/2021   Essential hypertension 02/27/2021   Chronic right sacroiliac pain 09/09/2020   Osteoporosis 11/29/2018   Piriformis syndrome of right side 10/23/2017   Spondylosis without myelopathy or radiculopathy, 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   Chronic myofascial pain 04/02/2017   ANA positive 01/25/2017   High risk medication use 05/30/2016   Xerostomia 05/03/2015   GERD (gastroesophageal reflux disease) 04/01/2015   IBS (irritable bowel syndrome) 04/01/2015   Insomnia 04/01/2015   Chronic seasonal allergic rhinitis due to  pollen 02/04/2015   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   Tinnitus 02/04/2015   Multifactorial low back pain 12/15/2014   Personal history of other malignant neoplasm of skin 12/08/2011   Chronic back pain 08/24/2011   Hypothyroidism 02/28/1995    ONSET DATE: 11/23/23  REFERRING DIAG: 9.041,M19.042 (ICD-10-CM) - Primary osteoarthritis of both hands   THERAPY DIAG:  Pain in joint of right hand  Pain in joint of left hand  Muscle weakness (generalized)  Stiffness of left hand, not elsewhere classified  Stiffness of right hand, not elsewhere classified  Rationale for Evaluation and Treatment: Rehabilitation  SUBJECTIVE:   SUBJECTIVE STATEMENT: Pt rpeorts bilateral hand pain Pt accompanied by: self  PERTINENT HISTORY:  inflammatory OA, and chronic low back pain  Bilateral thumb CMC arthritis, symptomatic L wrist STT arthritis, Sjgren's syndrome, See above for remaining PMH   WEIGHT BEARING RESTRICTIONS: No  PAIN:  Are you having pain? Yes: NPRS scale: 3-6/10 Pain location: bilateral hands, L wrist Pain description: aching, sharp Aggravating factors: overuse Relieving factors: rest, heat  FALLS: Has patient fallen in last 6 months? No  LIVING ENVIRONMENT: Lives with: lives with their spouse Lives in: House/apartment   PLOF: independent  PATIENT GOALS: adapted strategies for ADLs/IADLs.  NEXT MD VISIT: unknown  OBJECTIVE:  Note: Objective measures were completed at Evaluation unless otherwise noted.  HAND DOMINANCE: Right  ADLs:   FUNCTIONAL OUTCOME  MEASURES: Quick DASH:  UPPER EXTREMITY ROM:     Active ROM Right eval Left eval  Shoulder flexion    Shoulder abduction    Shoulder adduction    Shoulder extension    Shoulder internal rotation    Shoulder external rotation    Elbow flexion    Elbow extension    Wrist flexion 85 80  Wrist extension 53 45  Wrist ulnar deviation    Wrist radial deviation    Wrist pronation     Wrist supination    (Blank rows = not tested)  Active ROM Right eval Left eval  Thumb MCP (0-60)    Thumb IP (0-80)    Thumb Radial abd/add (0-55)     Thumb Palmar abd/add (0-45)     Thumb Opposition to Small Finger WFLs WFLS   Index MCP (0-90)     Index PIP (0-100)     Index DIP (0-70)      Long MCP (0-90)      Long PIP (0-100)      Long DIP (0-70)      Ring MCP (0-90)      Ring PIP (0-100)      Ring DIP (0-70)      Little MCP (0-90)      Little PIP (0-100)      Little DIP (0-70)      (Blank rows = not tested) Composite finger flexion: RUE 80% composite flexion, LUE WFLS, composite finger extension:RUE 90-95%, LUE WFLS    HAND FUNCTION: Grip strength: Right: 32 lbs; Left: 18 lbs    SENSATION: WFL  EDEMA: Pt has bony athritic deformities at the following joints: PIP and DIP of index finger, DIP middle finger and PIP of ring finger for RUE, LUE defomities of DIP joints for index and middle fingers.  COGNITION: Overall cognitive status: Within functional limits for tasks assessed  OBSERVATIONS: Pleasant female motivated to improve.   TREATMENT DATE: 12/13/23- Eval Paraffin to bilateral UE's x 8 mins for pain and stiffness, no adverse reactions. Pt reports decreased hand pain end of session.                                                                                                                          PATIENT EDUCATION: Education details: role of OT, potential goals,  Person educated: Patient Education method: Explanation Education comprehension: verbalized understanding  HOME EXERCISE PROGRAM: n/a  GOALS: Goals reviewed with patient? Yes   LONG TERM GOALS: Target date: 01/24/24  I with HEP Baseline:  Goal status: INITIAL  2.  Pt will verbalize understanding of AE/ adapted strategies and positioning to minimize pain and risk for deformity.  Goal status: INITIAL  3.  Pt will verbalize understanding of strategies for pain management(ie:  paraffin)  Goal status: INITIAL    ASSESSMENT:  CLINICAL IMPRESSION: Patient is a 73 y.o. female who was seen today for occupational therapy evaluation for Primary osteoarthritis of both hands Pt presents with  the performance deficits below. She can benefit from skilled occupational therapy to address these deficits in order to maximize pt safety and I with ADLs/IADLs.SABRA   PERFORMANCE DEFICITS: in functional skills including ADLs, IADLs, coordination, dexterity, ROM, strength, flexibility, Fine motor control, mobility, decreased knowledge of precautions, decreased knowledge of use of DME, and UE functional use, psychosocial skills including coping strategies, environmental adaptation, habits, interpersonal interactions, and routines and behaviors.   IMPAIRMENTS: are limiting patient from ADLs, IADLs, rest and sleep, play, leisure, and social participation.   COMORBIDITIES: may have co-morbidities  that affects occupational performance. Patient will benefit from skilled OT to address above impairments and improve overall function.  MODIFICATION OR ASSISTANCE TO COMPLETE EVALUATION: No modification of tasks or assist necessary to complete an evaluation.  OT OCCUPATIONAL PROFILE AND HISTORY: Detailed assessment: Review of records and additional review of physical, cognitive, psychosocial history related to current functional performance.  CLINICAL DECISION MAKING: LOW - limited treatment options, no task modification necessary  REHAB POTENTIAL: Good  EVALUATION COMPLEXITY: Low      PLAN:  OT FREQUENCY: 1x/week  OT DURATION: 6 weeks  PLANNED INTERVENTIONS: 97168 OT Re-evaluation, 97535 self care/ADL training, 02889 therapeutic exercise, 97530 therapeutic activity, 97112 neuromuscular re-education, 97140 manual therapy, 97035 ultrasound, 97018 paraffin, 02989 moist heat, 97010 cryotherapy, 97014 electrical stimulation unattended, 97760 Orthotic Initial, 97763 Orthotic/Prosthetic  subsequent, passive range of motion, functional mobility training, psychosocial skills training, energy conservation, coping strategies training, patient/family education, and DME and/or AE instructions  RECOMMENDED OTHER SERVICES: n/a  CONSULTED AND AGREED WITH PLAN OF CARE: Patient  PLAN FOR NEXT SESSION: AE/ adapted strategies for ADLs/IADLs.   Zaccary Creech, OT 12/13/2023, 12:07 PM

## 2023-12-18 ENCOUNTER — Encounter: Payer: Self-pay | Admitting: Physical Medicine & Rehabilitation

## 2023-12-18 ENCOUNTER — Encounter: Attending: Physical Medicine & Rehabilitation | Admitting: Physical Medicine & Rehabilitation

## 2023-12-18 VITALS — BP 128/74 | HR 59 | Ht 63.0 in | Wt 113.0 lb

## 2023-12-18 DIAGNOSIS — M25811 Other specified joint disorders, right shoulder: Secondary | ICD-10-CM | POA: Insufficient documentation

## 2023-12-18 DIAGNOSIS — Z79891 Long term (current) use of opiate analgesic: Secondary | ICD-10-CM | POA: Diagnosis not present

## 2023-12-18 DIAGNOSIS — G894 Chronic pain syndrome: Secondary | ICD-10-CM | POA: Diagnosis not present

## 2023-12-18 DIAGNOSIS — Z5181 Encounter for therapeutic drug level monitoring: Secondary | ICD-10-CM | POA: Diagnosis not present

## 2023-12-18 NOTE — Progress Notes (Signed)
 Subjective:    Patient ID: Kathleen [name], female    DOB: 1951/01/07, 73 y.o.   MRN: [id number]  HPI Discussed the use of AI scribe software for clinical note transcription with the patient, who gave verbal consent to proceed.  History of Present Illness [name] is a 72 year old female who presents with worsening chronic back pain after a trip to [place].  She has chronic back pain primarily located in the lumbar region, which has been constant and significant since returning from a trip to [place]. The pain is in the lower back and glutes, with occasional radiation from the hip down the leg. It varies with activity level and is not present every day. She underwent radiofrequency ablation on the right side in April, which provided some relief, but the pain has worsened since her trip.  She has been engaging in needling therapy on the glutes, which has provided some relief. Physical therapy has been part of her management. She is currently taking tramadol  as needed, primarily at night to aid with sleep or during the day if engaging in activities that might exacerbate her pain. Prolonged sitting can exacerbate her back pain, and she experiences pain with movement, including bending forward, backward, and to the sides.  She reports new onset shoulder pain, which has been gradually worsening and was particularly severe the previous night. No specific injury to the shoulder.  Her past medical history includes previous sacroiliac pain, which she still experiences, and a history of frozen shoulder approximately fifteen years ago. 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   Pain Inventory Average Pain 6 Pain Right Now 4 My pain is sharp, stabbing, tingling, and aching  In the last 24 hours, has pain interfered with the following? General activity 5 Relation with others 4 Enjoyment of life 5 What TIME of day is your pain at its worst? morning  and evening Sleep  (in general) NA  Pain is worse with: some activites Pain improves with: medication Relief from Meds: na  Family History  Problem Relation Age of Onset   Stroke Mother    Heart disease Mother    Osteoarthritis Mother    Arthritis Mother    Heart disease Father    Hypertension Father    Heart disease Paternal Uncle    Cancer Paternal Uncle        Lung   Social History   Socioeconomic History   Marital status: Married    Spouse name: Not on file   Number of children: 3   Years of education: Not on file   Highest education level: Bachelor's degree (e.g., BA, AB, BS)  Occupational History   Occupation: Retired  Tobacco Use   Smoking status: Never    Passive exposure: Never   Smokeless tobacco: Never  Vaping Use   Vaping status: Never Used  Substance and Sexual Activity   Alcohol use: Yes    Alcohol/week: 1.0 standard drink of alcohol    Types: 1 Cans of beer per week    Comment: socially   Drug use: Never   Sexual activity: Yes  Other Topics Concern   Not on file  Social History Narrative   Caffiene rare.   Working retired.     Social Drivers of Corporate investment banker Strain: Low Risk  (09/21/2023)   Overall Financial Resource Strain (CARDIA)    Difficulty of Paying Living Expenses: Not hard at all  Food Insecurity: No Food Insecurity (  09/21/2023)   Hunger Vital Sign    Worried About Running Out of Food in the Last Year: Never true    Ran Out of Food in the Last Year: Never true  Transportation Needs: No Transportation Needs (09/21/2023)   PRAPARE - Administrator, Civil Service (Medical): No    Lack of Transportation (Non-Medical): No  Physical Activity: Sufficiently Active (09/21/2023)   Exercise Vital Sign    Days of Exercise per Week: 5 days    Minutes of Exercise per Session: 40 min  Stress: No Stress Concern Present (09/21/2023)   [employer] of Occupational Health - Occupational Stress Questionnaire    Feeling of Stress: Not at all   Social Connections: Socially Integrated (09/21/2023)   Social Connection and Isolation Panel    Frequency of Communication with Friends and Family: More than three times a week    Frequency of Social Gatherings with Friends and Family: Three times a week    Attends Religious Services: More than 4 times per year    Active Member of Clubs or Organizations: Yes    Attends Banker Meetings: More than 4 times per year    Marital Status: Married   Past Surgical History:  Procedure Laterality Date   CATARACT EXTRACTION, BILATERAL     EYE SURGERY  2022   remove cataracts   KNEE ARTHROSCOPY W/ MENISCECTOMY Left    Past Surgical History:  Procedure Laterality Date   CATARACT EXTRACTION, BILATERAL     EYE SURGERY  2022   remove cataracts   KNEE ARTHROSCOPY W/ MENISCECTOMY Left    Past Medical History:  Diagnosis Date   Acid reflux    Allergy    seasonal   Cataract 2019   removed 2022   Hypertension 2022   still watching   Hypothyroid    Osteoarthritis    Sjogren's syndrome    Spondylosis of lumbar spine    BP 128/74   Pulse (!) 59   Ht 5' 3 (1.6 m)   Wt 113 lb (51.3 kg)   SpO2 97%   BMI 20.02 kg/m   Opioid Risk Score:   Fall Risk Score:  `1  Depression screen PHQ 2/9     09/21/2023    1:44 PM 08/14/2023    1:10 PM 10/03/2022   10:24 AM 09/18/2022    2:03 PM 06/22/2022   10:58 AM 05/16/2022   10:30 AM 01/17/2022   11:49 AM  Depression screen PHQ 2/9  Decreased Interest 0 0 0 0 0 0 0  Down, Depressed, Hopeless 0 0 0 0 0 0 0  PHQ - 2 Score 0 0 0 0 0 0 0  Altered sleeping 0   0     Tired, decreased energy 0   0     Change in appetite 0   0     Feeling bad or failure about yourself  0   0     Trouble concentrating 0   0     Moving slowly or fidgety/restless 0   0     Suicidal thoughts 0   0     PHQ-9 Score 0   0     Difficult doing work/chores Not difficult at all   Not difficult at all         Review of Systems  Musculoskeletal:  Positive for back  pain.       Right knee pain  All other systems reviewed and are negative.  Objective:   Physical Exam  Gaenslens:  Sacral thrust (prone) : Positive right Lateral compression: Negative FABER's: Negative Distraction (supine): Negative Thigh thrust test: Negative  General No acute distress Mood and affect appropriate Motor strength is 5/5 bilateral hip flexor knee extensor dorsiflexor Negative straight leg raising bilaterally Lumbar spine has reduced lumbar extension from 50% of normal flexion is normal lateral bending is 75% normal Extension hurts more than flexion. Right shoulder positive impingement at 90 degrees of abduction.  No deformity no range of motion deficits No evidence of subluxation    Assessment & Plan:  Assessment and Plan Assessment & Plan Chronic low back pain with right lumbar spondylosis Persistent lumbar pain with radiation, unrelieved by previous RFA. - Schedule weekly acupuncture for four weeks to evaluate pain control. - Consider cooled RFA if acupuncture is ineffective.  Impingement syndrome of shoulder Worsening shoulder pain, likely rotator cuff or bursitis involvement. - Order shoulder x-ray to evaluate for arthritis. - Recommend physical therapy for shoulder impingement. - Consider shoulder injection if pain persists post-therapy.

## 2023-12-18 NOTE — Patient Instructions (Signed)
 Acupuncture Acupuncture is a type of treatment that involves stimulating specific points on your body by inserting thin needles through your skin. Acupuncture is often used to treat pain, but it may also be used to help relieve other types of symptoms. Your health care provider may recommend acupuncture to help treat various conditions, such as: Migraine and tension headaches. Nausea and vomiting after a surgery or cancer treatment. Sudden or severe (acute) pain, or long-term (chronic) pain. Addiction. Acupuncture is based on traditional [place] medicine, which recognizes more than 2,000 points on the body that connect energy pathways (meridians) through the body. The goal in stimulating these points is to balance the physical, emotional, and mental energy in your body. Acupuncture is done by a health care provider who has specialized training (licensed acupuncture practitioner). Treatment often requires several acupuncture sessions. You may have acupuncture along with other medical treatments. Tell a health care provider about: Any allergies you have. All medicines you are taking, including vitamins, herbs, eye drops, creams, and over-the-counter medicines. Any blood disorders you have. Any surgeries you have had. Any medical conditions you have. Whether you are pregnant or may be pregnant. What are the risks? Generally, this is a safe treatment. However, problems may occur, including: Skin infection. Damage to organs or structures that are under the skin if a needle is placed too deeply. This is rare. What happens before the treatment? Your acupuncture practitioner will ask about your medical history and your symptoms. You may have a physical exam. What happens during the treatment? The exact procedure will depend on your condition and how your acupuncture provider treats it. In general: Your skin will be cleaned with a germ-killing (antiseptic) solution. Your acupuncture practitioner will  open a new set of germ-free (sterile) needles. The needles will be gently inserted into your skin. They will be left in place for a certain amount of time. You may feel a tingling or burning sensation for a very short period of time. Your acupuncture practitioner may: Apply electrical energy to the needles. Adjust the needles in certain ways. After your procedure, the acupuncture practitioner will remove the needles, throw them away, and clean your skin. The procedure may vary among health care providers. What can I expect after the treatment? People react differently to acupuncture. Make sure you ask your acupuncture provider what to expect after your treatment. It is common to have: Minor bruising. Mild pain. A small amount of bleeding. Follow these instructions at home: Follow any instructions given by your provider after the treatment. Keep all follow-up visits. This is important. Where to find more information [facility]: [email or web address] Contact a health care provider if: You have questions about your reaction to the treatment. You have soreness. You have skin irritation or redness. You have a fever. Summary Acupuncture is a type of treatment that involves stimulating specific points on your body by inserting thin needles through your skin. This treatment is often used to treat pain, but it may also be used to help relieve other types of symptoms. The exact procedure will depend on your condition and how your acupuncture provider treats it. This information is not intended to replace advice given to you by your health care provider. Make sure you discuss any questions you have with your health care provider. Document Revised: 10/20/2020 Document Reviewed: 10/20/2020 Elsevier Patient Education  2024 [employer].

## 2023-12-20 ENCOUNTER — Ambulatory Visit: Admitting: Occupational Therapy

## 2023-12-20 ENCOUNTER — Other Ambulatory Visit: Payer: Self-pay | Admitting: Podiatry

## 2023-12-20 DIAGNOSIS — M25641 Stiffness of right hand, not elsewhere classified: Secondary | ICD-10-CM

## 2023-12-20 DIAGNOSIS — M25642 Stiffness of left hand, not elsewhere classified: Secondary | ICD-10-CM

## 2023-12-20 DIAGNOSIS — M25541 Pain in joints of right hand: Secondary | ICD-10-CM | POA: Diagnosis not present

## 2023-12-20 DIAGNOSIS — M6281 Muscle weakness (generalized): Secondary | ICD-10-CM

## 2023-12-20 DIAGNOSIS — M25542 Pain in joints of left hand: Secondary | ICD-10-CM

## 2023-12-20 NOTE — Patient Instructions (Signed)
 WRIST: Extension Against Gravity   Rest arm and hand on surface, palm down. Raise wrist up. _10__ reps per set, __1_ sets per day, _7__ days per week  Copyright  VHI. All rights reserved.  WRIST: Flexion Against Gravity    Rest arm and hand on surface, palm up. Raise wrist up. __10_ reps per set, _1__ sets per day, _7__ days per week   Copyright  VHI. All rights reserved.    Finger Abduction / Adduction    Spread fingers of left hand. Then bring together. Keep fingers straight. Repeat sequence _10___ times per session. Do __7__ sessions per week. Hand Variation: Palm up   Copyright  VHI. All rights reserved.  Flexor Tendon Gliding (Active Hook Fist)   With fingers and knuckles straight, bend middle and tip joints. Do not bend large knuckles. Repeat _10-15___ times. Do 1___ sessions per day.  MP Flexion (Active)   With back of hand on table, bend large knuckles as far as they will go, keeping small joints straight. Repeat _10-15___ times. Do __1_ sessions per day. Activity: Reach into a narrow container.*      Finger Flexion / Extension   With palm up, bend fingers of left hand toward palm, making a  fist. Straighten fingers, opening fist. Repeat sequence _10-15___ times per session. Do _1_ sessions per day. Hand Variation: Palm down   Copyright  VHI. All rights reserved.    Opposition (Active)   Touch tip of thumb to nail tip of each finger in turn, making an O shape. Repeat __10__ times. Do _1___ sessions per day.   AROM: Thumb Abduction / Adduction      MP Flexion (Active)   Bend thumb to touch base of little finger, keeping tip joint straight. Repeat __10-15__ times. Do _1__ sessions per day

## 2023-12-20 NOTE — Therapy (Signed)
 OUTPATIENT OCCUPATIONAL THERAPY ORTHO  Patient Name: Kathleen [name] MRN: [id number] DOB:[birth year], 73 y.o., female Today's Date: 12/20/2023  PCP: Dr. Thedora REFERRING PROVIDER: Garnette Thedora MD  END OF SESSION:  OT End of Session - 12/20/23 0938     Visit Number 2    Number of Visits 6    Date for Recertification  01/24/24    Authorization Type Medicare    OT Start Time 0850    OT Stop Time 0930    OT Time Calculation (min) 40 min    Activity Tolerance Patient tolerated treatment well    Behavior During Therapy [facility] for tasks assessed/performed           Past Medical History:  Diagnosis Date   Acid reflux    Allergy    seasonal   Cataract 2019   removed 2022   Hypertension 2022   still watching   Hypothyroid    Osteoarthritis    Sjogren's syndrome    Spondylosis of lumbar spine    Past Surgical History:  Procedure Laterality Date   CATARACT EXTRACTION, BILATERAL     EYE SURGERY  2022   remove cataracts   KNEE ARTHROSCOPY W/ MENISCECTOMY Left    Patient Active Problem List   Diagnosis Date Noted   Hepatic cyst 06/04/2023   Hyperlipidemia 05/15/2023   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   Ganglion cyst of finger of left hand 07/03/2022   Family history of serrated polyposis syndrome (SPS)- Sister 11/01/2021   Dry eye syndrome of both eyes 10/18/2021   Sjogren's syndrome 04/07/2021   Osteoarthritis of hands, bilateral 04/07/2021   Essential hypertension 02/27/2021   Chronic right sacroiliac pain 09/09/2020   Osteoporosis 11/29/2018   Piriformis syndrome of right side 10/23/2017   Spondylosis without myelopathy or radiculopathy, 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   Chronic myofascial pain 04/02/2017   ANA positive 01/25/2017   High risk medication use 05/30/2016   Xerostomia 05/03/2015   GERD (gastroesophageal reflux disease) 04/01/2015   IBS (irritable bowel syndrome) 04/01/2015   Insomnia 04/01/2015   Chronic seasonal allergic rhinitis due to pollen  02/04/2015   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   Tinnitus 02/04/2015   Multifactorial low back pain 12/15/2014   Personal history of other malignant neoplasm of skin 12/08/2011   Chronic back pain 08/24/2011   Hypothyroidism 02/28/1995    ONSET DATE: 11/23/23  REFERRING DIAG: 9.041,M19.042 (ICD-10-CM) - Primary osteoarthritis of both hands   THERAPY DIAG:  Pain in joint of right hand  Pain in joint of left hand  Muscle weakness (generalized)  Stiffness of left hand, not elsewhere classified  Stiffness of right hand, not elsewhere classified  Rationale for Evaluation and Treatment: Rehabilitation  SUBJECTIVE:   SUBJECTIVE STATEMENT: Pt bilateral hand pain is better  Pt accompanied by: self  PERTINENT HISTORY:  inflammatory OA, and chronic low back pain  Bilateral thumb CMC arthritis, symptomatic L wrist STT arthritis, Sjgren's syndrome, See above for remaining PMH   WEIGHT BEARING RESTRICTIONS: No  PAIN:  Are you having pain? Yes: NPRS scale: 4-5/10 Pain location: bilateral hands, L wrist Pain description: aching, sharp Aggravating factors: overuse Relieving factors: rest, heat  FALLS: Has patient fallen in last 6 months? No  LIVING ENVIRONMENT: Lives with: lives with their spouse Lives in: House/apartment   PLOF: independent  PATIENT GOALS: adapted strategies for ADLs/IADLs.  NEXT MD VISIT: unknown  OBJECTIVE:  Note: Objective measures were completed at Evaluation unless otherwise noted.  HAND DOMINANCE: Right  ADLs:  FUNCTIONAL OUTCOME MEASURES: Quick DASH:  UPPER EXTREMITY ROM:     Active ROM Right eval Left eval  Shoulder flexion    Shoulder abduction    Shoulder adduction    Shoulder extension    Shoulder internal rotation    Shoulder external rotation    Elbow flexion    Elbow extension    Wrist flexion 85 80  Wrist extension 53 45  Wrist ulnar deviation    Wrist radial deviation    Wrist pronation    Wrist  supination    (Blank rows = not tested)  Active ROM Right eval Left eval  Thumb MCP (0-60)    Thumb IP (0-80)    Thumb Radial abd/add (0-55)     Thumb Palmar abd/add (0-45)     Thumb Opposition to Small Finger WFLs WFLS   Index MCP (0-90)     Index PIP (0-100)     Index DIP (0-70)      Long MCP (0-90)      Long PIP (0-100)      Long DIP (0-70)      Ring MCP (0-90)      Ring PIP (0-100)      Ring DIP (0-70)      Little MCP (0-90)      Little PIP (0-100)      Little DIP (0-70)      (Blank rows = not tested) Composite finger flexion: RUE 80% composite flexion, LUE WFLS, composite finger extension:RUE 90-95%, LUE WFLS    HAND FUNCTION: Grip strength: Right: 32 lbs; Left: 18 lbs    SENSATION: WFL  EDEMA: Pt has bony athritic deformities at the following joints: PIP and DIP of index finger, DIP middle finger and PIP of ring finger for RUE, LUE defomities of DIP joints for index and middle fingers.  COGNITION: Overall cognitive status: Within functional limits for tasks assessed  OBSERVATIONS: Pleasant female motivated to improve.   TREATMENT DATE: 12/20/23-Paraffin to bilateral hands x 10 mins while discussing paraffin bath and heating mitt, no adverse reactions. Pt was instructed in inital HEP, see pt instructions. Began dicussing activity modifications and adaptions for filing nails, opening jar, paring knife.  12/13/23- Eval Paraffin to bilateral UE's x 8 mins for pain and stiffness, no adverse reactions. Pt reports decreased hand pain end of session.                                                                                                                          PATIENT EDUCATION: Education details: inital HEP, begininning adaptations Person educated: Patient Education method: Explanation, demonstration,  Education comprehension: verbalized understanding, returned demonstration  HOME EXERCISE PROGRAM: n/a  GOALS: Goals reviewed with patient?  Yes   LONG TERM GOALS: Target date: 01/24/24  I with HEP Baseline:  Goal status: Ionoging 12/20/23- issued  2.  Pt will verbalize understanding of AE/ adapted strategies and positioning to minimize pain and risk for deformity.  Goal status: ongoing 12/20/23  3.  Pt will verbalize understanding of strategies for pain management(ie: paraffin)  Goal status: ongoing 12/20/23    ASSESSMENT:  CLINICAL IMPRESSION: Patient is progressing towards goals. She demonstrates understanding of inital HEP. PERFORMANCE DEFICITS: in functional skills including ADLs, IADLs, coordination, dexterity, ROM, strength, flexibility, Fine motor control, mobility, decreased knowledge of precautions, decreased knowledge of use of DME, and UE functional use, psychosocial skills including coping strategies, environmental adaptation, habits, interpersonal interactions, and routines and behaviors.   IMPAIRMENTS: are limiting patient from ADLs, IADLs, rest and sleep, play, leisure, and social participation.   COMORBIDITIES: may have co-morbidities  that affects occupational performance. Patient will benefit from skilled OT to address above impairments and improve overall function.  MODIFICATION OR ASSISTANCE TO COMPLETE EVALUATION: No modification of tasks or assist necessary to complete an evaluation.  OT OCCUPATIONAL PROFILE AND HISTORY: Detailed assessment: Review of records and additional review of physical, cognitive, psychosocial history related to current functional performance.  CLINICAL DECISION MAKING: LOW - limited treatment options, no task modification necessary  REHAB POTENTIAL: Good  EVALUATION COMPLEXITY: Low      PLAN:  OT FREQUENCY: 1x/week  OT DURATION: 6 weeks  PLANNED INTERVENTIONS: 97168 OT Re-evaluation, 97535 self care/ADL training, 02889 therapeutic exercise, 97530 therapeutic activity, 97112 neuromuscular re-education, 97140 manual therapy, 97035 ultrasound, 97018 paraffin,  02989 moist heat, 97010 cryotherapy, 97014 electrical stimulation unattended, 97760 Orthotic Initial, 97763 Orthotic/Prosthetic subsequent, passive range of motion, functional mobility training, psychosocial skills training, energy conservation, coping strategies training, patient/family education, and DME and/or AE instructions  RECOMMENDED OTHER SERVICES: n/a  CONSULTED AND AGREED WITH PLAN OF CARE: Patient  PLAN FOR NEXT SESSION: reveiw HEP, paraffin, continue AE recommendations   Kathleen [name], OT 12/20/2023, 9:39 AM

## 2023-12-24 ENCOUNTER — Encounter: Payer: Self-pay | Admitting: Physical Medicine & Rehabilitation

## 2023-12-26 ENCOUNTER — Ambulatory Visit: Admitting: Occupational Therapy

## 2023-12-26 DIAGNOSIS — M25642 Stiffness of left hand, not elsewhere classified: Secondary | ICD-10-CM

## 2023-12-26 DIAGNOSIS — M6281 Muscle weakness (generalized): Secondary | ICD-10-CM

## 2023-12-26 DIAGNOSIS — M25542 Pain in joints of left hand: Secondary | ICD-10-CM

## 2023-12-26 DIAGNOSIS — M25541 Pain in joints of right hand: Secondary | ICD-10-CM

## 2023-12-26 DIAGNOSIS — M25641 Stiffness of right hand, not elsewhere classified: Secondary | ICD-10-CM

## 2023-12-27 NOTE — Therapy (Signed)
 OUTPATIENT OCCUPATIONAL THERAPY ORTHO  Patient Name: Kathleen [name] MRN: [id number] DOB:07/29/1950, 73 y.o., female Today's Date: 12/27/2023  PCP: Dr. Thedora REFERRING PROVIDER: Garnette Thedora MD  END OF SESSION:  OT End of Session - 12/27/23 1310     Visit Number 3    Number of Visits 6    Date for Recertification  01/24/24    Authorization Type Medicare    OT Start Time 1405    OT Stop Time 1445    OT Time Calculation (min) 40 min           Past Medical History:  Diagnosis Date   Acid reflux    Allergy    seasonal   Cataract 2019   removed 2022   Hypertension 2022   still watching   Hypothyroid    Osteoarthritis    Sjogren's syndrome    Spondylosis of lumbar spine    Past Surgical History:  Procedure Laterality Date   CATARACT EXTRACTION, BILATERAL     EYE SURGERY  2022   remove cataracts   KNEE ARTHROSCOPY W/ MENISCECTOMY Left    Patient Active Problem List   Diagnosis Date Noted   Hepatic cyst 06/04/2023   Hyperlipidemia 05/15/2023   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   Ganglion cyst of finger of left hand 07/03/2022   Family history of serrated polyposis syndrome (SPS)- Sister 11/01/2021   Dry eye syndrome of both eyes 10/18/2021   Sjogren's syndrome 04/07/2021   Osteoarthritis of hands, bilateral 04/07/2021   Essential hypertension 02/27/2021   Chronic right sacroiliac pain 09/09/2020   Osteoporosis 11/29/2018   Piriformis syndrome of right side 10/23/2017   Spondylosis without myelopathy or radiculopathy, 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   Chronic myofascial pain 04/02/2017   ANA positive 01/25/2017   High risk medication use 05/30/2016   Xerostomia 05/03/2015   GERD (gastroesophageal reflux disease) 04/01/2015   IBS (irritable bowel syndrome) 04/01/2015   Insomnia 04/01/2015   Chronic seasonal allergic rhinitis due to pollen 02/04/2015   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   Tinnitus 02/04/2015   Multifactorial low back pain  12/15/2014   Personal history of other malignant neoplasm of skin 12/08/2011   Chronic back pain 08/24/2011   Hypothyroidism 02/28/1995    ONSET DATE: 11/23/23  REFERRING DIAG: 9.041,M19.042 (ICD-10-CM) - Primary osteoarthritis of both hands   THERAPY DIAG:  Pain in joint of right hand  Pain in joint of left hand  Muscle weakness (generalized)  Stiffness of left hand, not elsewhere classified  Stiffness of right hand, not elsewhere classified  Rationale for Evaluation and Treatment: Rehabilitation  SUBJECTIVE:   SUBJECTIVE STATEMENT: Pt bilateral hand pain is better  Pt accompanied by: self  PERTINENT HISTORY:  inflammatory OA, and chronic low back pain  Bilateral thumb CMC arthritis, symptomatic L wrist STT arthritis, Sjgren's syndrome, See above for remaining PMH   WEIGHT BEARING RESTRICTIONS: No  PAIN:  Are you having pain? Yes: NPRS scale: 4-5/10 Pain location: bilateral hands, L wrist Pain description: aching, sharp Aggravating factors: overuse Relieving factors: rest, heat  FALLS: Has patient fallen in last 6 months? No  LIVING ENVIRONMENT: Lives with: lives with their spouse Lives in: House/apartment   PLOF: independent  PATIENT GOALS: adapted strategies for ADLs/IADLs.  NEXT MD VISIT: unknown  OBJECTIVE:  Note: Objective measures were completed at Evaluation unless otherwise noted.  HAND DOMINANCE: Right  ADLs:   FUNCTIONAL OUTCOME MEASURES: Quick DASH:  UPPER EXTREMITY ROM:     Active ROM Right eval Left eval  Shoulder flexion    Shoulder abduction    Shoulder adduction    Shoulder extension    Shoulder internal rotation    Shoulder external rotation    Elbow flexion    Elbow extension    Wrist flexion 85 80  Wrist extension 53 45  Wrist ulnar deviation    Wrist radial deviation    Wrist pronation    Wrist supination    (Blank rows = not tested)  Active ROM Right eval Left eval  Thumb MCP (0-60)    Thumb IP (0-80)     Thumb Radial abd/add (0-55)     Thumb Palmar abd/add (0-45)     Thumb Opposition to Small Finger WFLs WFLS   Index MCP (0-90)     Index PIP (0-100)     Index DIP (0-70)      Long MCP (0-90)      Long PIP (0-100)      Long DIP (0-70)      Ring MCP (0-90)      Ring PIP (0-100)      Ring DIP (0-70)      Little MCP (0-90)      Little PIP (0-100)      Little DIP (0-70)      (Blank rows = not tested) Composite finger flexion: RUE 80% composite flexion, LUE WFLS, composite finger extension:RUE 90-95%, LUE WFLS    HAND FUNCTION: Grip strength: Right: 32 lbs; Left: 18 lbs    SENSATION: WFL  EDEMA: Pt has bony athritic deformities at the following joints: PIP and DIP of index finger, DIP middle finger and PIP of ring finger for RUE, LUE defomities of DIP joints for index and middle fingers.  COGNITION: Overall cognitive status: Within functional limits for tasks assessed  OBSERVATIONS: Pleasant female motivated to improve.   TREATMENT DATE: 10/29/25Paraffin to bilateral hands x 10 mins while discussing paraffin bath and heating mitt, no adverse reactions. Education regarding AE including  buttonhook, donning socks with sock aide, rocker knife and larger grip utensils. Therapist showed pt various options online as well as a thumb CMC splint to consisder for LUE.   12/20/23-Paraffin to bilateral hands x 10 mins while discussing paraffin bath and heating mitt, no adverse reactions. Pt was instructed in inital HEP, see pt instructions. Began dicussing activity modifications and adaptions for filing nails, opening jar, paring knife.  12/13/23- Eval Paraffin to bilateral UE's x 8 mins for pain and stiffness, no adverse reactions. Pt reports decreased hand pain end of session.                                                                                                                          PATIENT EDUCATION: Education details:see above Person educated: Patient Education  method: Programmer, Multimedia, facilities manager,  Education comprehension: verbalized understanding, returned demonstration  HOME EXERCISE PROGRAM: n/a  GOALS: Goals reviewed with patient? Yes   LONG TERM GOALS: Target date: 01/24/24  I with HEP Baseline:  Goal status: Ionoging 12/20/23- issued  2.  Pt will verbalize understanding of AE/ adapted strategies and positioning to minimize pain and risk for deformity.  Goal status: ongoing 12/26/23  3.  Pt will verbalize understanding of strategies for pain management(ie: paraffin)  Goal status: ongoing 12/20/23    ASSESSMENT:  CLINICAL IMPRESSION: Patient is progressing towards goals. She demonstrates understanding of recommendations for adapted equipment PERFORMANCE DEFICITS: in functional skills including ADLs, IADLs, coordination, dexterity, ROM, strength, flexibility, Fine motor control, mobility, decreased knowledge of precautions, decreased knowledge of use of DME, and UE functional use, psychosocial skills including coping strategies, environmental adaptation, habits, interpersonal interactions, and routines and behaviors.   IMPAIRMENTS: are limiting patient from ADLs, IADLs, rest and sleep, play, leisure, and social participation.   COMORBIDITIES: may have co-morbidities  that affects occupational performance. Patient will benefit from skilled OT to address above impairments and improve overall function.  MODIFICATION OR ASSISTANCE TO COMPLETE EVALUATION: No modification of tasks or assist necessary to complete an evaluation.  OT OCCUPATIONAL PROFILE AND HISTORY: Detailed assessment: Review of records and additional review of physical, cognitive, psychosocial history related to current functional performance.  CLINICAL DECISION MAKING: LOW - limited treatment options, no task modification necessary  REHAB POTENTIAL: Good  EVALUATION COMPLEXITY: Low      PLAN:  OT FREQUENCY: 1x/week  OT DURATION: 6 weeks  PLANNED  INTERVENTIONS: 97168 OT Re-evaluation, 97535 self care/ADL training, 02889 therapeutic exercise, 97530 therapeutic activity, 97112 neuromuscular re-education, 97140 manual therapy, 97035 ultrasound, 97018 paraffin, 02989 moist heat, 97010 cryotherapy, 97014 electrical stimulation unattended, 97760 Orthotic Initial, 97763 Orthotic/Prosthetic subsequent, passive range of motion, functional mobility training, psychosocial skills training, energy conservation, coping strategies training, patient/family education, and DME and/or AE instructions  RECOMMENDED OTHER SERVICES: n/a  CONSULTED AND AGREED WITH PLAN OF CARE: Patient  PLAN FOR NEXT SESSION: reveiw HEP, paraffin, continue AE recommendations, add to HEP   Kadien Lineman, OT 12/27/2023, 1:11 PM

## 2024-01-01 ENCOUNTER — Ambulatory Visit: Attending: Family Medicine | Admitting: Occupational Therapy

## 2024-01-01 DIAGNOSIS — M6281 Muscle weakness (generalized): Secondary | ICD-10-CM | POA: Diagnosis present

## 2024-01-01 DIAGNOSIS — M25541 Pain in joints of right hand: Secondary | ICD-10-CM | POA: Diagnosis present

## 2024-01-01 DIAGNOSIS — M25641 Stiffness of right hand, not elsewhere classified: Secondary | ICD-10-CM | POA: Diagnosis present

## 2024-01-01 DIAGNOSIS — M25542 Pain in joints of left hand: Secondary | ICD-10-CM | POA: Diagnosis present

## 2024-01-01 DIAGNOSIS — M25642 Stiffness of left hand, not elsewhere classified: Secondary | ICD-10-CM | POA: Insufficient documentation

## 2024-01-01 NOTE — Patient Instructions (Addendum)
 Isolated finger extension    Position Patient: Place left hand on firm surface, palm down. You do not need to hold othe fingers down Motion - Patient lifts __each____ finger. - Repeat __10_ times. Repeat with ___each___ finger. Do _1__ sessions per day.  Copyright  VHI. All rights reserved.           Joint Protection (Carrying)    Avoid carrying items with weight on fingers. Solution: Use a shoulder bag or a back pack.  Copyright  VHI. All rights reserved.  Joint Protection (Use Large Joints)    Avoid placing pressure on fingertips. Solution: Transfer work to other parts of body which are not affected or which have greater strength. Using body weight to push heavy doors open is an example.  Copyright  VHI. All rights reserved.  Joint Protection (Ulnar Deviation)    Avoid positions that cause fingers to lean sideways toward little finger. Solution: Use devices like jar-openers to assist in activities.  Copyright  VHI. All rights reserved.  Joint Protection (Pinch)    Avoid tight pinch, such as when holding a pen. Solution: Use a thick pen with a felt tip to reduce pressure on fingers.  Copyright  VHI. All rights reserved.  Joint Protection (Lifting)    Avoid picking up heavy items with one hand. Solution: Use both hands, and slide item whenever possible.  Copyright  VHI. All rights reserved.  Joint Protection (Grip)    Avoid: grasping thin utensils for prolonged periods. Solution: Hold thick-handled tools in dagger fashion when-ever possible for performing tasks such as stirring or scrub-bing. Relax fingers every 10 minutes during activity.  Copyright  VHI. All rights reserved. Joint Protection (Wringing)    Avoid wringing towels by twisting. Solution: Loop towel around sink faucet as if braiding and pull gently, or let drip-dry.  Copyright  VHI. All rights reserved.

## 2024-01-01 NOTE — Therapy (Signed)
 OUTPATIENT OCCUPATIONAL THERAPY ORTHO  Patient Name: Kathleen [name] MRN: [id number] DOB:1950-11-29, 73 y.o., female Today's Date: 01/01/2024  PCP: Dr. Thedora REFERRING PROVIDER: Garnette Thedora MD OCCUPATIONAL THERAPY DISCHARGE SUMMARY   Current functional level related to goals / functional outcomes: Pt made excellent progress. She achieved all goals   Remaining deficits: decreased ROM, pain, arthritic deformity   Education / Equipment: Pt was educated in the following: activity modification/ joint protection, AE, pain management techniques and HEP. She verbalizes understanding of all education.   Patient agrees to discharge. Patient goals were met. Patient is being discharged due to meeting the stated rehab goals..    END OF SESSION:  OT End of Session - 01/01/24 1410     Visit Number 4    Number of Visits 6    Date for Recertification  01/24/24    OT Start Time 1317    OT Stop Time 1400    OT Time Calculation (min) 43 min    Behavior During Therapy [facility] for tasks assessed/performed            Past Medical History:  Diagnosis Date   Acid reflux    Allergy    seasonal   Cataract 2019   removed 2022   Hypertension 2022   still watching   Hypothyroid    Osteoarthritis    Sjogren's syndrome    Spondylosis of lumbar spine    Past Surgical History:  Procedure Laterality Date   CATARACT EXTRACTION, BILATERAL     EYE SURGERY  2022   remove cataracts   KNEE ARTHROSCOPY W/ MENISCECTOMY Left    Patient Active Problem List   Diagnosis Date Noted   Hepatic cyst 06/04/2023   Hyperlipidemia 05/15/2023   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   Ganglion cyst of finger of left hand 07/03/2022   Family history of serrated polyposis syndrome (SPS)- Sister 11/01/2021   Dry eye syndrome of both eyes 10/18/2021   Sjogren's syndrome 04/07/2021   Osteoarthritis of hands, bilateral 04/07/2021   Essential hypertension 02/27/2021   Chronic right sacroiliac pain 09/09/2020    Osteoporosis 11/29/2018   Piriformis syndrome of right side 10/23/2017   Spondylosis without myelopathy or radiculopathy, 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   Chronic myofascial pain 04/02/2017   ANA positive 01/25/2017   High risk medication use 05/30/2016   Xerostomia 05/03/2015   GERD (gastroesophageal reflux disease) 04/01/2015   IBS (irritable bowel syndrome) 04/01/2015   Insomnia 04/01/2015   Chronic seasonal allergic rhinitis due to pollen 02/04/2015   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   Tinnitus 02/04/2015   Multifactorial low back pain 12/15/2014   Personal history of other malignant neoplasm of skin 12/08/2011   Chronic back pain 08/24/2011   Hypothyroidism 02/28/1995    ONSET DATE: 11/23/23  REFERRING DIAG: 9.041,M19.042 (ICD-10-CM) - Primary osteoarthritis of both hands   THERAPY DIAG:  Pain in joint of right hand  Pain in joint of left hand  Muscle weakness (generalized)  Stiffness of left hand, not elsewhere classified  Stiffness of right hand, not elsewhere classified  Rationale for Evaluation and Treatment: Rehabilitation  SUBJECTIVE:   SUBJECTIVE STATEMENT: Pt bilateral hand pain is better  Pt accompanied by: self  PERTINENT HISTORY:  inflammatory OA, and chronic low back pain  Bilateral thumb CMC arthritis, symptomatic L wrist STT arthritis, Sjgren's syndrome, See above for remaining PMH   WEIGHT BEARING RESTRICTIONS: No  PAIN:  Are you having pain? Yes: NPRS scale: 0-2/10 Pain location: bilateral hands, L wrist Pain description:  aching, sharp Aggravating factors: overuse Relieving factors: rest, heat  FALLS: Has patient fallen in last 6 months? No  LIVING ENVIRONMENT: Lives with: lives with their spouse Lives in: House/apartment   PLOF: independent  PATIENT GOALS: adapted strategies for ADLs/IADLs.  NEXT MD VISIT: unknown  OBJECTIVE:  Note: Objective measures were completed at Evaluation unless otherwise noted.  HAND  DOMINANCE: Right  ADLs:   FUNCTIONAL OUTCOME MEASURES: Quick DASH:  UPPER EXTREMITY ROM:     Active ROM Right eval Left eval  Shoulder flexion    Shoulder abduction    Shoulder adduction    Shoulder extension    Shoulder internal rotation    Shoulder external rotation    Elbow flexion    Elbow extension    Wrist flexion 85 80  Wrist extension 53 45  Wrist ulnar deviation    Wrist radial deviation    Wrist pronation    Wrist supination    (Blank rows = not tested)  Active ROM Right eval Left eval  Thumb MCP (0-60)    Thumb IP (0-80)    Thumb Radial abd/add (0-55)     Thumb Palmar abd/add (0-45)     Thumb Opposition to Small Finger WFLs WFLS   Index MCP (0-90)     Index PIP (0-100)     Index DIP (0-70)      Long MCP (0-90)      Long PIP (0-100)      Long DIP (0-70)      Ring MCP (0-90)      Ring PIP (0-100)      Ring DIP (0-70)      Little MCP (0-90)      Little PIP (0-100)      Little DIP (0-70)      (Blank rows = not tested) Composite finger flexion: RUE 80% composite flexion, LUE WFLS, composite finger extension:RUE 90-95%, LUE WFLS    HAND FUNCTION: Grip strength: Right: 32 lbs; Left: 18 lbs    SENSATION: WFL  EDEMA: Pt has bony athritic deformities at the following joints: PIP and DIP of index finger, DIP middle finger and PIP of ring finger for RUE, LUE defomities of DIP joints for index and middle fingers.  COGNITION: Overall cognitive status: Within functional limits for tasks assessed  OBSERVATIONS: Pleasant female motivated to improve.   TREATMENT DATE: 01/01/24-Paraffin to bilateral hands x 10 mins while discussing joint protection strategies  No averse reactions Reviewed A/ROM HEP, added isolated finger extension 10-20 reps each. Pt practiced using a sock aide for donning socks , min v.c for positioning. Checked progress towards goals and discussed plans for d/c .  10/29/25Paraffin to bilateral hands x 10 mins while discussing  paraffin bath and heating mitt, no adverse reactions. Education regarding AE including  buttonhook, donning socks with sock aide, rocker knife and larger grip utensils. Therapist showed pt various options online as well as a thumb CMC splint to consisder for LUE.   12/20/23-Paraffin to bilateral hands x 10 mins while discussing paraffin bath and heating mitt, no adverse reactions. Pt was instructed in inital HEP, see pt instructions. Began dicussing activity modifications and adaptions for filing nails, opening jar, paring knife.  12/13/23- Eval Paraffin to bilateral UE's x 8 mins for pain and stiffness, no adverse reactions. Pt reports decreased hand pain end of session.  PATIENT EDUCATION: Education details; joint protection, activity modification, use of sock aide, recommendation for LUE thumb/ wrist splint wear with heavier tasks to minimize risk for pain and injury. Person educated: Patient Education method: Explanation, demonstration, handouts Education comprehension: verbalized understanding, returned demonstration  HOME EXERCISE PROGRAM: n/a  GOALS: Goals reviewed with patient? Yes   LONG TERM GOALS: Target date: 01/24/24  I with HEP Baseline:  Goal status: met, 01/01/24  2.  Pt will verbalize understanding of AE/ adapted strategies and positioning to minimize pain and risk for deformity.  Goal status: met, 01/01/24  3.  Pt will verbalize understanding of strategies for pain management(ie: paraffin)  Goal status: met, 01/01/24    ASSESSMENT:  CLINICAL IMPRESSION: Patient made excellent overall progress achieveing all goals. She agrees with plans for d/c PERFORMANCE DEFICITS: in functional skills including ADLs, IADLs, coordination, dexterity, ROM, strength, flexibility, Fine motor control, mobility, decreased knowledge of precautions,  decreased knowledge of use of DME, and UE functional use, psychosocial skills including coping strategies, environmental adaptation, habits, interpersonal interactions, and routines and behaviors.   IMPAIRMENTS: are limiting patient from ADLs, IADLs, rest and sleep, play, leisure, and social participation.   COMORBIDITIES: may have co-morbidities  that affects occupational performance. Patient will benefit from skilled OT to address above impairments and improve overall function.  MODIFICATION OR ASSISTANCE TO COMPLETE EVALUATION: No modification of tasks or assist necessary to complete an evaluation.  OT OCCUPATIONAL PROFILE AND HISTORY: Detailed assessment: Review of records and additional review of physical, cognitive, psychosocial history related to current functional performance.  CLINICAL DECISION MAKING: LOW - limited treatment options, no task modification necessary  REHAB POTENTIAL: Good  EVALUATION COMPLEXITY: Low      PLAN:  OT FREQUENCY: 1x/week  OT DURATION: 6 weeks  PLANNED INTERVENTIONS: 97168 OT Re-evaluation, 97535 self care/ADL training, 02889 therapeutic exercise, 97530 therapeutic activity, 97112 neuromuscular re-education, 97140 manual therapy, 97035 ultrasound, 97018 paraffin, 02989 moist heat, 97010 cryotherapy, 97014 electrical stimulation unattended, 97760 Orthotic Initial, 97763 Orthotic/Prosthetic subsequent, passive range of motion, functional mobility training, psychosocial skills training, energy conservation, coping strategies training, patient/family education, and DME and/or AE instructions  RECOMMENDED OTHER SERVICES: n/a  CONSULTED AND AGREED WITH PLAN OF CARE: Patient  PLAN FOR NEXT SESSION:d/c OT   Kathleen [name], OT 01/01/2024, 2:11 PM

## 2024-01-03 ENCOUNTER — Other Ambulatory Visit: Payer: Self-pay

## 2024-01-03 ENCOUNTER — Encounter (HOSPITAL_BASED_OUTPATIENT_CLINIC_OR_DEPARTMENT_OTHER): Payer: Self-pay | Admitting: Emergency Medicine

## 2024-01-03 ENCOUNTER — Emergency Department (HOSPITAL_BASED_OUTPATIENT_CLINIC_OR_DEPARTMENT_OTHER)
Admission: EM | Admit: 2024-01-03 | Discharge: 2024-01-03 | Discharge status: Against Medical Advice | Attending: Emergency Medicine | Admitting: Emergency Medicine

## 2024-01-03 ENCOUNTER — Emergency Department (HOSPITAL_BASED_OUTPATIENT_CLINIC_OR_DEPARTMENT_OTHER)

## 2024-01-03 ENCOUNTER — Ambulatory Visit
Admission: RE | Admit: 2024-01-03 | Discharge: 2024-01-03 | Disposition: A | Discharge status: Home / Self Care | Source: Ambulatory Visit | Attending: Physical Medicine & Rehabilitation | Admitting: Physical Medicine & Rehabilitation

## 2024-01-03 ENCOUNTER — Encounter: Payer: Self-pay | Admitting: Family Medicine

## 2024-01-03 DIAGNOSIS — R0781 Pleurodynia: Secondary | ICD-10-CM | POA: Insufficient documentation

## 2024-01-03 DIAGNOSIS — Z5321 Procedure and treatment not carried out due to patient leaving prior to being seen by health care provider: Secondary | ICD-10-CM | POA: Diagnosis not present

## 2024-01-03 DIAGNOSIS — M546 Pain in thoracic spine: Secondary | ICD-10-CM | POA: Diagnosis present

## 2024-01-03 DIAGNOSIS — M25811 Other specified joint disorders, right shoulder: Secondary | ICD-10-CM

## 2024-01-03 DIAGNOSIS — Y92410 Unspecified street and highway as the place of occurrence of the external cause: Secondary | ICD-10-CM | POA: Insufficient documentation
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
## 2024-01-03 NOTE — ED Notes (Signed)
Pt called x 3 no answer 

## 2024-01-04 NOTE — Telephone Encounter (Signed)
Please review and advise. Thanks. Dm/cma  

## 2024-01-29 ENCOUNTER — Encounter: Payer: Self-pay | Admitting: Physical Medicine & Rehabilitation

## 2024-01-29 ENCOUNTER — Encounter: Attending: Physical Medicine & Rehabilitation | Admitting: Physical Medicine & Rehabilitation

## 2024-01-29 VITALS — BP 118/67 | HR 68 | Ht 63.0 in | Wt 112.6 lb

## 2024-01-29 DIAGNOSIS — G8929 Other chronic pain: Secondary | ICD-10-CM | POA: Insufficient documentation

## 2024-01-29 DIAGNOSIS — M47816 Spondylosis without myelopathy or radiculopathy, lumbar region: Secondary | ICD-10-CM | POA: Diagnosis present

## 2024-01-29 DIAGNOSIS — M545 Low back pain, unspecified: Secondary | ICD-10-CM | POA: Diagnosis present

## 2024-01-29 DIAGNOSIS — M533 Sacrococcygeal disorders, not elsewhere classified: Secondary | ICD-10-CM | POA: Diagnosis present

## 2024-01-29 NOTE — Progress Notes (Signed)
 Acupuncture treatment #1 Needles placed at right BL 23 right BL 25 as well as right BL 52 right BL 53 as well as right GB 30 electrical stimulation 2 Hz between right BL 23 and BL 52 as well as between BL 25 and BL 53 Patient tolerated procedure well Weekly visits x 4 Treatment time 25 minutes

## 2024-02-02 ENCOUNTER — Other Ambulatory Visit: Payer: Self-pay | Admitting: Family Medicine

## 2024-02-02 DIAGNOSIS — E039 Hypothyroidism, unspecified: Secondary | ICD-10-CM

## 2024-02-05 ENCOUNTER — Encounter: Admitting: Physical Medicine & Rehabilitation

## 2024-02-05 ENCOUNTER — Encounter: Payer: Self-pay | Admitting: Physical Medicine & Rehabilitation

## 2024-02-05 VITALS — BP 132/75 | HR 62 | Ht 63.0 in | Wt 112.0 lb

## 2024-02-05 DIAGNOSIS — G8929 Other chronic pain: Secondary | ICD-10-CM

## 2024-02-05 DIAGNOSIS — M533 Sacrococcygeal disorders, not elsewhere classified: Secondary | ICD-10-CM | POA: Diagnosis not present

## 2024-02-05 DIAGNOSIS — M47816 Spondylosis without myelopathy or radiculopathy, lumbar region: Secondary | ICD-10-CM | POA: Diagnosis not present

## 2024-02-05 DIAGNOSIS — M545 Low back pain, unspecified: Secondary | ICD-10-CM

## 2024-02-05 NOTE — Progress Notes (Signed)
 Acupuncture treatment #2 Needles placed at right BL 23 right BL 25 as well as right BL 52 right BL 53 as well as right GB 30 electrical stimulation 2 Hz between right BL 23 and BL 52 as well as between BL 25 and BL 53 Patient tolerated procedure well Weekly visits x 4 Treatment time 25 minutes

## 2024-02-12 ENCOUNTER — Encounter: Payer: Self-pay | Admitting: Physical Medicine & Rehabilitation

## 2024-02-12 ENCOUNTER — Ambulatory Visit: Admitting: Podiatry

## 2024-02-12 ENCOUNTER — Encounter: Admitting: Physical Medicine & Rehabilitation

## 2024-02-12 VITALS — BP 146/75 | HR 62 | Ht 63.0 in | Wt 113.0 lb

## 2024-02-12 DIAGNOSIS — M545 Low back pain, unspecified: Secondary | ICD-10-CM | POA: Diagnosis not present

## 2024-02-12 DIAGNOSIS — G8929 Other chronic pain: Secondary | ICD-10-CM

## 2024-02-12 DIAGNOSIS — M47816 Spondylosis without myelopathy or radiculopathy, lumbar region: Secondary | ICD-10-CM | POA: Diagnosis not present

## 2024-02-12 NOTE — Progress Notes (Signed)
 Acupuncture treatment #3 Needles placed at bilateral T12 paraspinal right L2 L4 and bilateral S1 paraspinal as well as right GB 30.  Electrical stimulation between bilateral T12, right L2 and L4 as well as right S1-GB30.  2 Hz stimulation x 25 minutes Patient tolerated procedure well Weekly visits x 4 Treatment time 25 minutes  We discussed that we will reassess her back pain after the fourth visit.  Consider cooled RF if acupuncture is not providing any sufficient relief.

## 2024-02-20 ENCOUNTER — Other Ambulatory Visit: Payer: Self-pay | Admitting: Family Medicine

## 2024-02-20 DIAGNOSIS — M47816 Spondylosis without myelopathy or radiculopathy, lumbar region: Secondary | ICD-10-CM

## 2024-02-26 ENCOUNTER — Encounter: Admitting: Physical Medicine & Rehabilitation

## 2024-02-26 ENCOUNTER — Encounter: Payer: Self-pay | Admitting: Physical Medicine & Rehabilitation

## 2024-02-26 VITALS — BP 124/70 | HR 68 | Ht 63.0 in | Wt 115.0 lb

## 2024-02-26 DIAGNOSIS — M47816 Spondylosis without myelopathy or radiculopathy, lumbar region: Secondary | ICD-10-CM

## 2024-02-26 NOTE — Progress Notes (Signed)
 Acupuncture treatment #4 Needles placed at bilateral T12 paraspinal right L2 L4 and bilateral S1 paraspinal as well as right GB 30.  Electrical stimulation between bilateral T12, right L2 and L4 as well as right S1-GB30.  2 Hz stimulation x 25 minutes Patient tolerated procedure well  Treatment time 25 minutes  Patient has had excellent relief of her chronic right buttock pain.  Pain relief has been 2 weeks thus far.  Will set her up for acupuncture monthly visits for 2026

## 2024-03-03 ENCOUNTER — Other Ambulatory Visit: Payer: Self-pay | Admitting: Family Medicine

## 2024-03-03 DIAGNOSIS — J301 Allergic rhinitis due to pollen: Secondary | ICD-10-CM

## 2024-03-03 DIAGNOSIS — K219 Gastro-esophageal reflux disease without esophagitis: Secondary | ICD-10-CM

## 2024-03-18 ENCOUNTER — Encounter: Payer: Self-pay | Admitting: Podiatry

## 2024-03-18 ENCOUNTER — Ambulatory Visit (INDEPENDENT_AMBULATORY_CARE_PROVIDER_SITE_OTHER): Admitting: Podiatry

## 2024-03-18 ENCOUNTER — Ambulatory Visit

## 2024-03-18 DIAGNOSIS — S99921D Unspecified injury of right foot, subsequent encounter: Secondary | ICD-10-CM

## 2024-03-18 NOTE — Patient Instructions (Signed)

## 2024-03-20 ENCOUNTER — Encounter: Payer: Self-pay | Admitting: Podiatry

## 2024-03-20 NOTE — Progress Notes (Signed)
"  °  Subjective:  Patient ID: Anshu Wehner, female    DOB: [birth year],  MRN: [id number]  Chief Complaint  Patient presents with   Nail Problem    The toenail is perfect.   Toe Injury    The toe, I'm still having some pain.    Discussed the use of AI scribe software for clinical note transcription with the patient, who gave verbal consent to proceed.  History of Present Illness Guida Asman is a 74 year old female who presents with toe pain and nail issues following a toe injury. Returns for follow-up from injury of October 2025 with second toe injury.  The nail is doing well.  The toe continues to give her pain with activity      Objective:    Physical Exam VASCULAR: DP and PT pulse palpable. Foot is warm and well-perfused. Capillary fill time is brisk. DERMATOLOGIC: Normal skin turgor, texture, and temperature. No open lesions, rashes, or ulcerations. NEUROLOGIC: Normal sensation to light touch and pressure. No paresthesias on examination. ORTHOPEDIC: Pain to palpation of plantar second MTP joint especially with attempted dorsiflexion and plantarflexion mild edema.  No instability on exam   No images are attached to the encounter.    Results RADIOLOGY Foot X-ray: New x-rays did not show any change.  She has an elongated second ray.  No fracture of the phalanx or metatarsal second right    Assessment:   1. Injury of plantar plate, right, subsequent encounter      Plan:  Patient was evaluated and treated and all questions answered.  Assessment and Plan Assessment & Plan   Right second toe pain and plantar plate sprain She returns for follow-up today, having continued pain in the joint.  Suspect plantar plate tear or sprain.  Recommend MRI to evaluate at this point to help guide further treatment including injection and/or MRI.  Budin splint dispensed and she will utilize this for maintaining plantarflexion of the toe.  Follow-up in 2 months to  reevaluate           Return in about 8 weeks (around 05/13/2024) for f/u on plantar plate injury .   "

## 2024-03-24 ENCOUNTER — Ambulatory Visit: Admitting: Family Medicine

## 2024-03-26 ENCOUNTER — Ambulatory Visit: Payer: Self-pay | Admitting: Podiatry

## 2024-03-27 ENCOUNTER — Encounter: Admitting: Physical Medicine & Rehabilitation

## 2024-03-30 ENCOUNTER — Other Ambulatory Visit: Payer: Self-pay | Admitting: Family Medicine

## 2024-03-30 DIAGNOSIS — M47816 Spondylosis without myelopathy or radiculopathy, lumbar region: Secondary | ICD-10-CM

## 2024-03-31 NOTE — Telephone Encounter (Signed)
 Refill request received for Meloxicam  15mg  FOV: 04/15/24 LOV: 11/23/23 Last refill: 02/01/23 Medication is pending your approval.

## 2024-04-02 NOTE — Assessment & Plan Note (Signed)
 SABRA

## 2024-04-02 NOTE — Progress Notes (Unsigned)
 "  Office Visit Note  Patient: Kathleen [name]             Date of Birth: 16-Mar-1950           MRN: [id number]             PCP: Thedora Garnette HERO, MD Referring: Thedora Garnette HERO, MD Visit Date: 04/11/2024   Subjective:  No chief complaint on file.   History of Present Illness: Kathleen [name] is a 74 y.o. female here for follow up for Sjogren's syndrome on hydroxychloroquine  200 mg daily, restasis , and cevimeline  30 mg 3 times daily.     Previous HPI 10/10/2023 Kathleen [name] is a 74 y.o. female here for follow up for Sjogren's syndrome on hydroxychloroquine  200 mg daily, restasis , and cevimeline  30 mg 3 times daily.     She experiences significant pain and stiffness in her fingers with some associated swelling.  She is currently prescribed meloxicam  gabapentin  and Cymbalta  with her PCP Dr. Thedora which are fairly beneficial for her joint pain and stiffness.  She is also prescribed tramadol  as needed from Dr. Carilyn but takes this only occasionally. She uses a rice heating pad for comfort.   She had a previous evaluation of her hands by Dr. Stefani discussing advanced triscaphe joint degeneration with potential for surgery but not recommended at the time.    She also reports looking into a clinical study based out of [facility] for Sjogren syndrome but was excluded due to negative SSA antibody testing as one of the admission criteria for Sjogren's classification.   Previous HPI 04/12/2023 Kathleen [name] is a 74 y.o. female here for follow up for Sjogren's syndrome on hydroxychloroquine  200 mg daily and cevimeline  30 mg 3 times daily.    She experiences moderate symptoms from Sjogren's syndrome, with significant dryness in her mouth and eyes. The mouth dryness causes discomfort against her teeth. She uses Plaquenil  (hydroxychloroquine ) and sedumiline at full dose, along with Restasis  eye drops twice daily. She previously tried a nasal spray, Tylosin, but found it ineffective and  uncomfortable. She maintains a humidified environment at home to help manage her symptoms. No sores or ulcers in the mouth or nose are present.   She has ongoing hand arthritis, particularly in the triscaphe region, with persistent swelling and occasional difficulty with rings fitting. She has been prescribed tramadol  for pain management, primarily for her back issues, and has not noticed significant changes in joint swelling. 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 Her hands are always swollen, though she does not track specific episodes of increased swelling.   Chronic back pain is present, particularly in the sacroiliac and lumbar regions, which has worsened over time. She has previously undergone radiofrequency ablation treatments and is considering further interventions before an upcoming cruise in May.   Recently, she experienced irritation in the inner corner of her left eye, which has improved over the past week. No major illnesses such as upper respiratory infections, COVID, or flu have been reported recently.      Previous HPI 10/10/2022 Kathleen [name] is a 74 y.o. female here for follow up for Sjogren's syndrome on hydroxychloroquine  200 mg daily and cevimeline  30 mg 3 times daily.  She had some benefit in chronic low back pain with nerve ablation and working with physical therapy on this plus some related gluteal muscle pain or tendinopathy.  Currently having worse trouble with joint pain in her fingers mostly on  the right hand and left wrist.  Worst at the second and fourth digit and getting some skin irritation between the fingers. Not seeing much visible swelling or discoloration.     Previous HPI 04/11/2022 Kathleen [name] is a 74 y.o. female here for follow up for Sjogren's syndrome on hydroxychloroquine  200 mg daily and cevimeline  30 mg 3 times daily.  Currently taking meloxicam  15 mg daily  consistently worst area of pain currently in the back but also getting a little bit worse in her hands compared to last visit.  She is scheduled for lumbar spine radiofrequency ablation on the 20th.  On Systane and Restasis  eyedrops.  She feels like her concentration and brain fog is also slightly worse than usual in the past month or 2.   Previous HPI 10/05/21 Kathleen [name] is a 74 y.o. female here for follow up for sjogren's syndrome on HCQ 200 mg daily. She is taking cevimeline  30 mg TID for dry mouth and using restasis  and lubricating eye drops for dry eyes. She started using the Tyrvaya  in the past 2 weeks so far not sure about the amount of difference. Eye dryness has possibly been a bit increased recently. She has pain and stiffness in her hands is bothersome as well. She takes meloxicam  gabapentin  and duloxetine  largely for back pain but also som help for hands. She has not seen much difference when try topical diclofenac . Saw Dr. Camella for evaluation of options with a finger cyst and her finger deformities, but currently holding off on any aggressive steps.   Previous HPI 04/07/2021 Kathleen [name] is a 74 y.o. female here for sjogren's syndrome on hydroxycloroquine 200 mg daily and cevimeline  TID for dry mouth. She previously saw Dr. Dollene for this problem.  She was originally diagnosed after establishing care with him in 2016 with existing problems of chronic arthritis affecting facet joints knees and hands.  Work-up revealed a positive ANA with significant eye and mouth dryness symptoms no specific extractable nuclear antibodies identified.  She was not sure since a specific time point her symptoms developed somewhat progressive over preceding years.  She was started on hydroxychloroquine  with partial symptom improvement as well as continuing topical diclofenac , oral meloxicam , gabapentin , and Cymbalta  for chronic degenerative joint pain especially from the back.  Symptoms in her fingers and knees  are persistent but not severely limiting of activity.  She has noticed some progression of the finger deformities over the past several years but usually without acute swelling redness or warmth. For her dry mouth symptoms she uses a Biotene spray drinks frequently and xylitol oral lozenges.  She takes cevimeline  3 times daily with a good benefit and no significant intolerance.  She denies any pain or swelling around major salivary glands or cervical lymphadenopathy. For dry eyes she uses Systane drops also using Restasis  drops in both eyes twice daily with a good benefit.  She never experiences any periorbital swelling has no previous major abrasions or other complications.  She has regular follow-up with her eye Dr. Elsie Andreas at [facility] most recent in August 2022 with normal OCT testing and annual follow-up.  She most recently was started on a trial of Tyrvaya  nasal spray she is not sure if there was a large change in symptoms but also did not continue the medicine for a long time due to lack of follow-up and finishing the sample amount.     Labs reviewed 06/2017 SSA neg SSB neg   12/2014 ANA 1:160 homogenous  No Rheumatology ROS completed.   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   Ganglion cyst of finger of left hand 07/03/2022   Family history of serrated polyposis syndrome (SPS)- Sister 11/01/2021   Dry eye syndrome of both eyes 10/18/2021   Sjogren's syndrome 04/07/2021   Osteoarthritis of hands, bilateral 04/07/2021   Essential hypertension 02/27/2021   Chronic right sacroiliac pain 09/09/2020   Osteoporosis 11/29/2018   Piriformis syndrome of right side 10/23/2017   Spondylosis without myelopathy or radiculopathy, 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   Chronic myofascial pain 04/02/2017   ANA positive 01/25/2017   High risk medication use 05/30/2016    Xerostomia 05/03/2015   GERD (gastroesophageal reflux disease) 04/01/2015   IBS (irritable bowel syndrome) 04/01/2015   Insomnia 04/01/2015   Chronic seasonal allergic rhinitis due to pollen 02/04/2015   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   Tinnitus 02/04/2015   Multifactorial low back pain 12/15/2014   Personal history of other malignant neoplasm of skin 12/08/2011   Chronic back pain 08/24/2011   Hypothyroidism 02/28/1995    Past Medical History:  Diagnosis Date   Acid reflux    Allergy    seasonal   Cataract 2019   removed 2022   Hypertension 2022   still watching   Hypothyroid    Osteoarthritis    Sjogren's syndrome    Spondylosis of lumbar spine     Family History  Problem Relation Age of Onset   Stroke Mother    Heart disease Mother    Osteoarthritis Mother    Arthritis Mother    Heart disease Father    Hypertension Father    Heart disease Paternal Uncle    Cancer Paternal Uncle        Lung   Past Surgical History:  Procedure Laterality Date   CATARACT EXTRACTION, BILATERAL     EYE SURGERY  2022   remove cataracts   KNEE ARTHROSCOPY W/ MENISCECTOMY Left    Social History   Social History Narrative   Caffiene rare.   Working retired.     Immunization History  Administered Date(s) Administered   Fluad Quad(high Dose 65+) 12/04/2018   INFLUENZA, HIGH DOSE SEASONAL PF 12/24/2017   Influenza Split 11/27/2020, 12/06/2021, 12/15/2022   Influenza-Unspecified 12/12/2023   PFIZER Comirnaty(Gray Top)Covid-19 Tri-Sucrose Vaccine 03/24/2019, 04/14/2019, 12/15/2019, 06/14/2020   Pfizer Covid-19 Vaccine Bivalent Booster 70yrs & up 12/09/2020, 12/06/2021, 12/15/2022   Pfizer Covid-19 Vaccine Bivalent Booster 5y-11y 12/12/2023   Pneumococcal Conjugate-13 11/29/2018   Pneumococcal Polysaccharide-23 08/12/2020   Tdap 03/26/2008, 10/07/2018   Zoster Recombinant(Shingrix) 11/26/2017, 02/05/2018   Zoster, Live 11/23/2011, 12/14/2011      Objective: Vital Signs: There were no vitals taken for this visit.   Physical Exam   Musculoskeletal Exam: ***   Investigation: No additional findings.  Imaging: No results found.  Recent Labs: Lab Results  Component Value Date   WBC 6.6 10/10/2023   HGB 12.2 10/10/2023   PLT 322 10/10/2023   NA 137 10/10/2023   K 4.5 10/10/2023   CL 101 10/10/2023   CO2 28 10/10/2023   GLUCOSE 81 10/10/2023   BUN 24 10/10/2023   CREATININE 0.82 10/10/2023   BILITOT 0.4 10/15/2023   ALKPHOS 38 (L) 10/15/2023   AST 20 10/15/2023   ALT 13 10/15/2023   PROT 6.6 10/15/2023   ALBUMIN 4.4 10/15/2023   CALCIUM  9.9 10/10/2023  Speciality Comments: PLQ EYE EXAM 03/13/2022 normal [facility] f/u 1 year  Patient has an appointment scheduled in 01/2024, will keep calling for cancellations  Procedures:  No procedures performed Allergies: Clindamycin/lincomycin   Assessment / Plan:     Visit Diagnoses:  Assessment & Plan Sjogren's syndrome without extraglandular involvement     High risk medication use     Primary osteoarthritis of both hands     Multifactorial low back pain      ***  Follow-Up Instructions: No follow-ups on file.   Tanicia Wolaver M Cohan Stipes, CMA  Note - This record has been created using Animal nutritionist.  Chart creation errors have been sought, but may not always  have been located. Such creation errors do not reflect on  the standard of medical care. "

## 2024-04-11 ENCOUNTER — Encounter: Attending: Physical Medicine & Rehabilitation | Admitting: Physical Medicine & Rehabilitation

## 2024-04-11 ENCOUNTER — Ambulatory Visit: Admitting: Internal Medicine

## 2024-04-11 DIAGNOSIS — M19041 Primary osteoarthritis, right hand: Secondary | ICD-10-CM

## 2024-04-11 DIAGNOSIS — M3500 Sicca syndrome, unspecified: Secondary | ICD-10-CM

## 2024-04-11 DIAGNOSIS — Z79899 Other long term (current) drug therapy: Secondary | ICD-10-CM

## 2024-04-11 DIAGNOSIS — M47816 Spondylosis without myelopathy or radiculopathy, lumbar region: Secondary | ICD-10-CM

## 2024-04-14 ENCOUNTER — Other Ambulatory Visit

## 2024-04-15 ENCOUNTER — Ambulatory Visit: Admitting: Family Medicine

## 2024-04-24 ENCOUNTER — Encounter: Admitting: Physical Medicine & Rehabilitation

## 2024-05-13 ENCOUNTER — Ambulatory Visit: Admitting: Podiatry

## 2024-05-22 ENCOUNTER — Encounter: Admitting: Physical Medicine & Rehabilitation

## 2024-06-19 ENCOUNTER — Encounter: Admitting: Physical Medicine & Rehabilitation

## 2024-07-17 ENCOUNTER — Encounter: Admitting: Physical Medicine & Rehabilitation

## 2024-08-14 ENCOUNTER — Encounter: Admitting: Physical Medicine & Rehabilitation

## 2024-09-11 ENCOUNTER — Encounter: Admitting: Physical Medicine & Rehabilitation

## 2024-09-22 ENCOUNTER — Ambulatory Visit

## 2024-10-09 ENCOUNTER — Encounter: Admitting: Physical Medicine & Rehabilitation

## 2024-11-06 ENCOUNTER — Encounter: Admitting: Physical Medicine & Rehabilitation

## 2024-12-04 ENCOUNTER — Encounter: Admitting: Physical Medicine & Rehabilitation

## 2025-01-01 ENCOUNTER — Encounter: Admitting: Physical Medicine & Rehabilitation

## 2025-01-29 ENCOUNTER — Encounter: Admitting: Physical Medicine & Rehabilitation
